# Patient Record
Sex: Male | Born: 2000 | Race: White | Hispanic: No | Marital: Single | Smoking: Current every day smoker
Health system: Southern US, Community
[De-identification: ages and names within clinical notes are randomized; demographics above are authoritative.]

## PROBLEM LIST (undated history)

## (undated) DIAGNOSIS — F32A Depression, unspecified: Secondary | ICD-10-CM

## (undated) DIAGNOSIS — T7840XA Allergy, unspecified, initial encounter: Secondary | ICD-10-CM

## (undated) DIAGNOSIS — F419 Anxiety disorder, unspecified: Secondary | ICD-10-CM

## (undated) DIAGNOSIS — L309 Dermatitis, unspecified: Secondary | ICD-10-CM

## (undated) DIAGNOSIS — K5900 Constipation, unspecified: Secondary | ICD-10-CM

## (undated) HISTORY — DX: Allergy, unspecified, initial encounter: T78.40XA

## (undated) HISTORY — DX: Anxiety disorder, unspecified: F41.9

## (undated) HISTORY — DX: Depression, unspecified: F32.A

---
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
## 2020-09-16 ENCOUNTER — Encounter: Payer: Self-pay | Admitting: Physician Assistant

## 2020-09-16 ENCOUNTER — Ambulatory Visit (INDEPENDENT_AMBULATORY_CARE_PROVIDER_SITE_OTHER): Payer: BC Managed Care – PPO | Admitting: Physician Assistant

## 2020-09-16 ENCOUNTER — Other Ambulatory Visit: Payer: Self-pay

## 2020-09-16 ENCOUNTER — Other Ambulatory Visit: Payer: Self-pay | Admitting: Physician Assistant

## 2020-09-16 VITALS — BP 110/60 | HR 89 | Temp 97.1°F | Resp 16 | Ht 75.0 in | Wt 198.0 lb

## 2020-09-16 DIAGNOSIS — H9311 Tinnitus, right ear: Secondary | ICD-10-CM

## 2020-09-16 DIAGNOSIS — H9041 Sensorineural hearing loss, unilateral, right ear, with unrestricted hearing on the contralateral side: Secondary | ICD-10-CM

## 2020-09-16 DIAGNOSIS — G4701 Insomnia due to medical condition: Secondary | ICD-10-CM

## 2020-09-16 DIAGNOSIS — H6982 Other specified disorders of Eustachian tube, left ear: Secondary | ICD-10-CM

## 2020-09-16 DIAGNOSIS — F32A Depression, unspecified: Secondary | ICD-10-CM

## 2020-09-16 DIAGNOSIS — L2082 Flexural eczema: Secondary | ICD-10-CM | POA: Diagnosis not present

## 2020-09-16 DIAGNOSIS — K5010 Crohn's disease of large intestine without complications: Secondary | ICD-10-CM

## 2020-09-16 DIAGNOSIS — Z23 Encounter for immunization: Secondary | ICD-10-CM

## 2020-09-16 DIAGNOSIS — F419 Anxiety disorder, unspecified: Secondary | ICD-10-CM

## 2020-09-16 MED ORDER — AMITRIPTYLINE HCL 50 MG PO TABS: 50.0000 mg | ORAL_TABLET | Freq: Every day | ORAL | 1 refills | Status: DC

## 2020-09-16 MED ORDER — TRIAMCINOLONE ACETONIDE 55 MCG/ACT NA AERO: 2.0000 | INHALATION_SPRAY | Freq: Every day | NASAL | 12 refills | Status: DC

## 2020-09-16 MED ORDER — TRIAMCINOLONE ACETONIDE 0.1 % EX CREA: 1.0000 "application " | TOPICAL_CREAM | Freq: Two times a day (BID) | CUTANEOUS | 0 refills | Status: DC

## 2020-09-16 NOTE — Patient Instructions (Addendum)
Please increase your Amitriptyline to 50 mg nightly..  I have sent in a new prescription for you.   For the skin, please continue your moisturizing regimen. Start the Triamcinolone twice daily as directed to the areas of concern.  We will do this for the next 10-14 days to calm things down with a plan to switch over to the [place] after this. (I am having to complete a prior authorization for this medication before we can send in).   For the ear, restart Zyrtec daily for 2 weeks. Also use the Nasacort daily as directed.  Let me know if the clogged sensation is not improving.   We will follow-up in 3-4 weeks. Sooner if needed.  It was very nice meeting you today. Welcome to [facility]!   Marland Kitchen

## 2020-09-16 NOTE — Progress Notes (Signed)
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  Patient endorses longstanding issue of flexural eczema, starting in his youth.  Notes some years are better than others.  Symptoms are always worse in the colder, drier months.  Notes significant itching of hands, wrists and the inside of his elbows.  Has been applying OTC moisturizing cream with only slight benefit.  Denies areas on his neck, face or lower extremities at present.  Patient also noting about a week or so of fullness of his left ear with pressure and popping.  Notes he had a cold a couple of weeks ago that has resolved.  Denies any tinnitus or draining from this ear.  Has chronic right ear tinnitus as noted previously.  Does have history of seasonal/environmental allergies.  Patient also endorses increased anxiety and depressed mood with anhedonia, mainly stemming from his frustration over his tendinitis and its effect on his sleep.  Was recently placed on amitriptyline 25 mg nightly by his ENT to help with sleep.  Notes tolerating medication well and some improvement in sleep.  Still dealing with depressed mood.  Start seeing counselor through school.  Notes good support system.  Denies suicidal thought or ideation.   Health Maintenance: Immunizations -- up-to-date.   Past Medical History:  Diagnosis Date  . Constipation since about age 64  . Eczema     Past Surgical History:  Procedure Laterality Date  . COLONOSCOPY N/A 12/29/2012   Procedure: COLONOSCOPY;  Surgeon: Oletha Blend, MD;  Location: [facility];  Service: Gastroenterology;  Laterality: N/A;  . COLONOSCOPY N/A 12/29/2012   Procedure: COLONOSCOPY;  Surgeon: Oletha Blend, MD;  Location: [facility];  Service: Gastroenterology;  Laterality:  N/A;  . ESOPHAGOGASTRODUODENOSCOPY N/A 12/29/2012   Procedure: ESOPHAGOGASTRODUODENOSCOPY (EGD);  Surgeon: Oletha Blend, MD;  Location: [facility];  Service: Gastroenterology;  Laterality: N/A;  . ESOPHAGOGASTRODUODENOSCOPY Bilateral 12/29/2012   Procedure: ESOPHAGOGASTRODUODENOSCOPY (EGD);  Surgeon: Oletha Blend, MD;  Location: [facility];  Service: Gastroenterology;  Laterality: Bilateral;    Current Outpatient Medications on File Prior to Visit  Medication Sig Dispense Refill  . adalimumab (HUMIRA) 40 MG/0.8ML injection Inject 0.8 mLs (40 mg total) into the skin every 14 (fourteen) days. Will need loading dose of 2 kits at onset followed by 1 kit in 2 weeks. 2 each 11  . loratadine (CLARITIN) 10 MG tablet Take 10 mg by mouth daily.    . Multiple Vitamin (MULTIVITAMIN) tablet Take 1 tablet by mouth daily.     No current facility-administered medications on file prior to visit.    No Known Allergies  History reviewed. No pertinent family history.  Social History   Socioeconomic History  . Marital status: Single    Spouse name: Not on file  . Number of children: Not on file  . Years of education: Not on file  . Highest education level: Not on file  Occupational History  . Occupation: Ship broker  Tobacco Use  . Smoking status: Never Smoker  . Smokeless tobacco: Never Used  Vaping Use  . Vaping Use: Never used  Substance and Sexual Activity  . Alcohol use: No  . Drug use: No  . Sexual activity: Never  Other Topics Concern  . Not on file  Social History Narrative  Student at [employer]   Social Determinants of Health   Financial Resource Strain:   . Difficulty of Paying Living Expenses: Not on file  Food Insecurity:   . Worried About Running Out of Food in the Last Year: Not on file  . Ran Out of Food in the Last Year: Not on file  Transportation Needs:   . Lack of Transportation (Medical): Not on file  . Lack of Transportation (Non-Medical): Not on file  Physical Activity:     . Days of Exercise per Week: Not on file  . Minutes of Exercise per Session: Not on file  Stress:   . Feeling of Stress : Not on file  Social Connections:   . Frequency of Communication with Friends and Family: Not on file  . Frequency of Social Gatherings with Friends and Family: Not on file  . Attends Religious Services: Not on file  . Active Member of Clubs or Organizations: Not on file  . Attends Club or Organization Meetings: Not on file  . Marital Status: Not on file  Intimate Partner Violence:   . Fear of Current or Ex-Partner: Not on file  . Emotionally Abused: Not on file  . Physically Abused: Not on file  . Sexually Abused: Not on file   ROS Pertinent ROS are listed in the HPI.   Resp 16   Ht 6' 3" (1.905 m)   Wt 198 lb (89.8 kg)   BMI 24.75 kg/m   Physical Exam Vitals reviewed.  Constitutional:      Appearance: Normal appearance.  HENT:     Head: Normocephalic and atraumatic.     Right Ear: Tympanic membrane, ear canal and external ear normal. There is no impacted cerumen.     Left Ear: Ear canal and external ear normal. There is no impacted cerumen.     Ears:     Comments: Mild serous effusion noted behind L TM.  Eyes:     Conjunctiva/sclera: Conjunctivae normal.     Pupils: Pupils are equal, round, and reactive to light.  Cardiovascular:     Rate and Rhythm: Normal rate and regular rhythm.     Pulses: Normal pulses.     Heart sounds: Normal heart sounds.  Pulmonary:     Effort: Pulmonary effort is normal.  Musculoskeletal:     Cervical back: Neck supple.  Skin:      Neurological:     General: No focal deficit present.     Mental Status: He is alert and oriented to person, place, and time.  Psychiatric:        Mood and Affect: Mood normal.    Assessment/Plan: 1. Flexural eczema Moderate disease state.  We will start with Kenalog 0.1% cream applied twice daily to areas of concern x2 weeks.  We will then plan to transition to Eucrisa.  Prior  authorization started for this medication.  Supportive measures and home moisturizing regimen reviewed with patient. - triamcinolone (KENALOG) 0.1 %; Apply 1 application topically 2 (two) times daily.  Dispense: 30 g; Refill: 0  2. Eustachian tube dysfunction, left We will start patient on Nasacort.  He is to resume use of Zyrtec daily.  Follow-up if not improving. - triamcinolone (NASACORT) 55 MCG/ACT AERO nasal inhaler; Place 2 sprays into the nose daily.  Dispense: 1 each; Refill: 12  3. Sensorineural hearing loss (SNHL) of right ear with unrestricted hearing of left ear 4. Tinnitus aurium, right Managed by ENT.  Is contributing to a lot of his   anxiety and depressive symptoms which we are starting treatment for as outlined below.  5. 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  Will increase Elavil to 50 mg nightly to further help with mood and sleep.  Close follow-up scheduled. - amitriptyline (ELAVIL) 50 MG tablet; Take 1 tablet (50 mg total) by mouth at bedtime.  Dispense: 30 tablet; Refill: 1  7. Crohn's disease of colon without complication ([place]) Under good control with current regimen.  Continue management per specialist.  8. Need for immunization against influenza Flu shot given at today's visit - Flu Vaccine QUAD 36+ mos IM  This visit occurred during the SARS-CoV-2 public health emergency.  Safety protocols were in place, including screening questions prior to the visit, additional usage of staff PPE, and extensive cleaning of exam room while observing appropriate contact time as indicated for disinfecting solutions.     Leeanne Rio, PA-C

## 2020-09-19 ENCOUNTER — Other Ambulatory Visit: Payer: Self-pay | Admitting: Physician Assistant

## 2020-10-14 ENCOUNTER — Telehealth: Payer: BC Managed Care – PPO | Admitting: Physician Assistant

## 2020-10-15 ENCOUNTER — Other Ambulatory Visit: Payer: Self-pay

## 2020-10-15 ENCOUNTER — Encounter: Payer: Self-pay | Admitting: Physician Assistant

## 2020-10-15 ENCOUNTER — Telehealth (INDEPENDENT_AMBULATORY_CARE_PROVIDER_SITE_OTHER): Payer: BC Managed Care – PPO | Admitting: Physician Assistant

## 2020-10-15 DIAGNOSIS — L2082 Flexural eczema: Secondary | ICD-10-CM

## 2020-10-15 DIAGNOSIS — H6982 Other specified disorders of Eustachian tube, left ear: Secondary | ICD-10-CM

## 2020-10-15 NOTE — Progress Notes (Signed)
Virtual Visit via Video   I connected with patient on 10/15/20 at  2:30 PM EST by a video enabled telemedicine application and verified that I am speaking with the correct person using two identifiers.  Location patient: Home Location provider: [facility], Office Persons participating in the virtual visit: Patient, Provider, CMA (Patina Moore)  I discussed the limitations of evaluation and management by telemedicine and the availability of in person appointments. The patient expressed understanding and agreed to proceed.  Subjective:   HPI:   Patient presents via [facility] today to follow-up regarding allergic rhinitis and eczema. At last visit, patient was restarted on Triamcinolone for flexural eczema. Endorses using as directed along with recommended OTC skin products, noting a substantial improvement in symptoms. Denies any new or worsening areas of concern.   In regards to chronic rhinitis and decreased hearing, patient was instructed to start nasal steroid and daily antihistamine. Patient endorses using as directed with improvement in nasal congestion, ear pressure and PND. Notes hearing feels back to normal.  ROS:   See pertinent positives and negatives per HPI.  Patient Active Problem List   Diagnosis Date Noted  . Family history of aortic dissection 10/12/2018  . History of colon resection 04/07/2018  . Eczema 04/07/2018  . 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 Sensorineural hearing loss (SNHL) of right ear with unrestricted hearing of left ear 09/03/2015  . Long-term use of adalimumab 04/25/2014  . Crohn's colitis (HCC) 12/31/2012  . 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    Social History   Tobacco Use  . Smoking status: Never Smoker  . Smokeless tobacco: Never Used  Substance Use Topics  . Alcohol use: Yes    Alcohol/week: 8.0 standard drinks    Types: 8 Cans of beer per week    Current Outpatient Medications:  .  amitriptyline (ELAVIL) 50 MG  tablet, Take 1 tablet (50 mg total) by mouth at bedtime., Disp: 30 tablet, Rfl: 1 .  cetirizine (ZYRTEC) 10 MG tablet, Take by mouth., Disp: , Rfl:  .  folic acid (FOLVITE) 800 MCG tablet, Take by mouth., Disp: , Rfl:  .  HUMIRA 40 MG/0.4ML PSKT, Inject into the skin., Disp: , Rfl:  .  lidocaine-prilocaine (EMLA) cream, Apply topically., Disp: , Rfl:  .  Melatonin 5 MG LOZG, Place 1 lozenge under the tongue at bedtime as needed., Disp: , Rfl:  .  methotrexate (RHEUMATREX) 2.5 MG tablet, Take by mouth., Disp: , Rfl:  .  ondansetron (ZOFRAN) 4 MG tablet, Take 1 tablet (4 mg total) by mouth 30 minutes before methotrexate dose every week., Disp: , Rfl:  .  triamcinolone (KENALOG) 0.1 %, Apply 1 application topically 2 (two) times daily., Disp: 30 g, Rfl: 0 .  triamcinolone (NASACORT) 55 MCG/ACT AERO nasal inhaler, Place 2 sprays into the nose daily., Disp: 1 each, Rfl: 12  No Known Allergies  Objective:   There were no vitals taken for this visit.  Patient is well-developed, well-nourished in no acute distress.  Resting comfortably at home.  Head is normocephalic, atraumatic.  No labored breathing.  Speech is clear and coherent with logical content.  Patient is alert and oriented at baseline.   Assessment and Plan:   1. Flexural eczema Improving. Continue current regimen, reducing topical steroid to once daily dosing for 1 week, then PRN. Continue moisturizing regimen.   2. Eustachian tube dysfunction, left Improved with combination antihistamine and nasal steroid. Hearing back to baseline. Continue current regimen.     Sherley Bounds  Daphine Deutscher, PA-C 10/15/2020

## 2020-10-15 NOTE — Progress Notes (Signed)
I have discussed the procedure for the virtual visit with the patient who has given consent to proceed with assessment and treatment.   Sujey Gundry S Haseeb Fiallos, CMA     

## 2020-10-31 ENCOUNTER — Other Ambulatory Visit: Payer: BC Managed Care – PPO

## 2021-03-12 ENCOUNTER — Telehealth: Payer: Self-pay | Admitting: Physician Assistant

## 2021-03-12 NOTE — Telephone Encounter (Signed)
Left a vm message with recommendations.

## 2021-03-12 NOTE — Telephone Encounter (Signed)
Since his provider is no longer here and it has been 6 months since that discussion, he will need to schedule an appt (preferably w/ his new provider)

## 2021-03-12 NOTE — Telephone Encounter (Signed)
Pt's mom called in asking if we can send in the script of the Eucrisa to the [facility] on [address].  Please see office note from Nov. With [place].   Please advise

## 2021-10-29 ENCOUNTER — Other Ambulatory Visit (HOSPITAL_BASED_OUTPATIENT_CLINIC_OR_DEPARTMENT_OTHER): Payer: Self-pay

## 2021-10-29 DIAGNOSIS — G4733 Obstructive sleep apnea (adult) (pediatric): Secondary | ICD-10-CM

## 2023-11-22 ENCOUNTER — Encounter: Payer: Self-pay | Admitting: Gastroenterology

## 2023-11-22 ENCOUNTER — Encounter (HOSPITAL_COMMUNITY): Payer: Self-pay | Admitting: Gastroenterology

## 2023-11-23 ENCOUNTER — Other Ambulatory Visit (HOSPITAL_COMMUNITY): Payer: Self-pay | Admitting: Gastroenterology

## 2023-11-23 DIAGNOSIS — K50811 Crohn's disease of both small and large intestine with rectal bleeding: Secondary | ICD-10-CM

## 2023-11-23 DIAGNOSIS — R109 Unspecified abdominal pain: Secondary | ICD-10-CM

## 2023-11-23 DIAGNOSIS — R634 Abnormal weight loss: Secondary | ICD-10-CM

## 2023-12-27 ENCOUNTER — Inpatient Hospital Stay (HOSPITAL_COMMUNITY)
Admission: EM | Admit: 2023-12-27 | Discharge: 2024-01-03 | DRG: 386 | Disposition: A | Discharge status: Home / Self Care | Payer: Self-pay | Attending: Student in an Organized Health Care Education/Training Program | Admitting: Student in an Organized Health Care Education/Training Program

## 2023-12-27 ENCOUNTER — Encounter (HOSPITAL_COMMUNITY): Payer: Self-pay | Admitting: Emergency Medicine

## 2023-12-27 ENCOUNTER — Other Ambulatory Visit: Payer: Self-pay

## 2023-12-27 DIAGNOSIS — R194 Change in bowel habit: Secondary | ICD-10-CM

## 2023-12-27 DIAGNOSIS — K50112 Crohn's disease of large intestine with intestinal obstruction: Secondary | ICD-10-CM | POA: Diagnosis not present

## 2023-12-27 DIAGNOSIS — Z7960 Long term (current) use of unspecified immunomodulators and immunosuppressants: Secondary | ICD-10-CM

## 2023-12-27 DIAGNOSIS — Z818 Family history of other mental and behavioral disorders: Secondary | ICD-10-CM

## 2023-12-27 DIAGNOSIS — Z79899 Other long term (current) drug therapy: Secondary | ICD-10-CM

## 2023-12-27 DIAGNOSIS — R1084 Generalized abdominal pain: Secondary | ICD-10-CM

## 2023-12-27 DIAGNOSIS — Z9049 Acquired absence of other specified parts of digestive tract: Secondary | ICD-10-CM

## 2023-12-27 DIAGNOSIS — K56609 Unspecified intestinal obstruction, unspecified as to partial versus complete obstruction: Secondary | ICD-10-CM | POA: Diagnosis not present

## 2023-12-27 DIAGNOSIS — D84821 Immunodeficiency due to drugs: Secondary | ICD-10-CM | POA: Diagnosis present

## 2023-12-27 DIAGNOSIS — K529 Noninfective gastroenteritis and colitis, unspecified: Secondary | ICD-10-CM

## 2023-12-27 DIAGNOSIS — F845 Asperger's syndrome: Secondary | ICD-10-CM | POA: Diagnosis present

## 2023-12-27 DIAGNOSIS — Z8279 Family history of other congenital malformations, deformations and chromosomal abnormalities: Secondary | ICD-10-CM

## 2023-12-27 DIAGNOSIS — Z8719 Personal history of other diseases of the digestive system: Secondary | ICD-10-CM

## 2023-12-27 MED ORDER — ONDANSETRON HCL 4 MG/2ML IJ SOLN
4.0000 mg | Freq: Once | INTRAMUSCULAR | Status: AC | PRN
  Administered 2023-12-28: 4 mg via INTRAVENOUS
  Filled 2023-12-27: qty 2

## 2023-12-27 NOTE — ED Triage Notes (Signed)
 Patient c/o abdominal pain tonight. Patient report taking PRN dicyclamine and tylenol for pain without relief. Patient report nausea and vomitting x 5 tonight. Patient denies fever. Hx of crohn's Dse

## 2023-12-28 ENCOUNTER — Emergency Department (HOSPITAL_COMMUNITY)

## 2023-12-28 ENCOUNTER — Encounter (HOSPITAL_COMMUNITY): Payer: Self-pay

## 2023-12-28 DIAGNOSIS — Z818 Family history of other mental and behavioral disorders: Secondary | ICD-10-CM | POA: Diagnosis not present

## 2023-12-28 DIAGNOSIS — K50918 Crohn's disease, unspecified, with other complication: Secondary | ICD-10-CM | POA: Diagnosis not present

## 2023-12-28 DIAGNOSIS — Z8719 Personal history of other diseases of the digestive system: Secondary | ICD-10-CM | POA: Diagnosis not present

## 2023-12-28 DIAGNOSIS — K56609 Unspecified intestinal obstruction, unspecified as to partial versus complete obstruction: Principal | ICD-10-CM | POA: Diagnosis present

## 2023-12-28 DIAGNOSIS — Z7960 Long term (current) use of unspecified immunomodulators and immunosuppressants: Secondary | ICD-10-CM | POA: Diagnosis not present

## 2023-12-28 DIAGNOSIS — Z8279 Family history of other congenital malformations, deformations and chromosomal abnormalities: Secondary | ICD-10-CM | POA: Diagnosis not present

## 2023-12-28 DIAGNOSIS — F845 Asperger's syndrome: Secondary | ICD-10-CM | POA: Diagnosis present

## 2023-12-28 DIAGNOSIS — Z9049 Acquired absence of other specified parts of digestive tract: Secondary | ICD-10-CM | POA: Diagnosis not present

## 2023-12-28 DIAGNOSIS — Z79899 Other long term (current) drug therapy: Secondary | ICD-10-CM | POA: Diagnosis not present

## 2023-12-28 DIAGNOSIS — D84821 Immunodeficiency due to drugs: Secondary | ICD-10-CM | POA: Diagnosis present

## 2023-12-28 DIAGNOSIS — K529 Noninfective gastroenteritis and colitis, unspecified: Secondary | ICD-10-CM | POA: Diagnosis not present

## 2023-12-28 DIAGNOSIS — K50112 Crohn's disease of large intestine with intestinal obstruction: Secondary | ICD-10-CM | POA: Diagnosis present

## 2023-12-28 DIAGNOSIS — R1084 Generalized abdominal pain: Secondary | ICD-10-CM | POA: Diagnosis not present

## 2023-12-28 LAB — CBC
HCT: 39.4 % (ref 39.0–52.0)
HCT: 40.6 % (ref 39.0–52.0)
Hemoglobin: 12.3 g/dL — ABNORMAL LOW (ref 13.0–17.0)
Hemoglobin: 13.1 g/dL (ref 13.0–17.0)
MCH: 26.5 pg (ref 26.0–34.0)
MCH: 27.1 pg (ref 26.0–34.0)
MCHC: 31.2 g/dL (ref 30.0–36.0)
MCHC: 32.3 g/dL (ref 30.0–36.0)
MCV: 84.1 fL (ref 80.0–100.0)
MCV: 84.9 fL (ref 80.0–100.0)
Platelets: 244 10*3/uL (ref 150–400)
Platelets: 269 10*3/uL (ref 150–400)
RBC: 4.64 MIL/uL (ref 4.22–5.81)
RBC: 4.83 MIL/uL (ref 4.22–5.81)
RDW: 13.8 % (ref 11.5–15.5)
RDW: 13.9 % (ref 11.5–15.5)
WBC: 11.2 10*3/uL — ABNORMAL HIGH (ref 4.0–10.5)
WBC: 7.7 10*3/uL (ref 4.0–10.5)
nRBC: 0 % (ref 0.0–0.2)
nRBC: 0 % (ref 0.0–0.2)

## 2023-12-28 LAB — URINALYSIS, ROUTINE W REFLEX MICROSCOPIC
Bilirubin Urine: NEGATIVE
Glucose, UA: NEGATIVE mg/dL
Hgb urine dipstick: NEGATIVE
Ketones, ur: 80 mg/dL — AB
Leukocytes,Ua: NEGATIVE
Nitrite: NEGATIVE
Protein, ur: NEGATIVE mg/dL
Specific Gravity, Urine: >1.046 — ABNORMAL HIGH (ref 1.005–1.030)
pH: 6 (ref 5.0–8.0)

## 2023-12-28 LAB — COMPREHENSIVE METABOLIC PANEL
ALT: 9 U/L (ref 0–44)
AST: 14 U/L — ABNORMAL LOW (ref 15–41)
Albumin: 3.9 g/dL (ref 3.5–5.0)
Alkaline Phosphatase: 57 U/L (ref 38–126)
Anion gap: 11 (ref 5–15)
BUN: 12 mg/dL (ref 6–20)
CO2: 25 mmol/L (ref 22–32)
Calcium: 8.9 mg/dL (ref 8.9–10.3)
Chloride: 101 mmol/L (ref 98–111)
Creatinine, Ser: 0.84 mg/dL (ref 0.61–1.24)
GFR, Estimated: >60 mL/min (ref >60)
Glucose, Bld: 99 mg/dL (ref 70–99)
Potassium: 3.7 mmol/L (ref 3.5–5.1)
Sodium: 137 mmol/L (ref 135–145)
Total Bilirubin: 0.5 mg/dL (ref 0.0–1.2)
Total Protein: 7.3 g/dL (ref 6.5–8.1)

## 2023-12-28 LAB — HIV ANTIBODY (ROUTINE TESTING W REFLEX): HIV Screen 4th Generation wRfx: NONREACTIVE

## 2023-12-28 LAB — CREATININE, SERUM
Creatinine, Ser: 0.81 mg/dL (ref 0.61–1.24)
GFR, Estimated: >60 mL/min (ref >60)

## 2023-12-28 LAB — LIPASE, BLOOD: Lipase: 23 U/L (ref 11–51)

## 2023-12-28 MED ORDER — HYDRALAZINE HCL 20 MG/ML IJ SOLN: 10.0000 mg | INTRAMUSCULAR | Status: DC | PRN

## 2023-12-28 MED ORDER — ONDANSETRON HCL 4 MG PO TABS
4.0000 mg | ORAL_TABLET | Freq: Four times a day (QID) | ORAL | Status: DC | PRN
Start: 2023-12-28 — End: 2024-01-03

## 2023-12-28 MED ORDER — IPRATROPIUM-ALBUTEROL 0.5-2.5 (3) MG/3ML IN SOLN: 3.0000 mL | RESPIRATORY_TRACT | Status: DC | PRN

## 2023-12-28 MED ORDER — DEXTROSE-SODIUM CHLORIDE 5-0.45 % IV SOLN: INTRAVENOUS | Status: DC

## 2023-12-28 MED ORDER — METHYLPREDNISOLONE SODIUM SUCC 40 MG IJ SOLR
40.0000 mg | Freq: Two times a day (BID) | INTRAMUSCULAR | Status: DC
  Administered 2023-12-28 – 2024-01-03 (×12): 40 mg via INTRAVENOUS
  Filled 2023-12-28 (×12): qty 1

## 2023-12-28 MED ORDER — SODIUM CHLORIDE 0.9 % IV SOLN
12.5000 mg | Freq: Once | INTRAVENOUS | Status: AC
  Administered 2023-12-28: 12.5 mg via INTRAVENOUS
  Filled 2023-12-28: qty 12.5

## 2023-12-28 MED ORDER — ACETAMINOPHEN 325 MG PO TABS
650.0000 mg | ORAL_TABLET | Freq: Four times a day (QID) | ORAL | Status: DC | PRN
  Administered 2024-01-02: 650 mg
  Filled 2023-12-28: qty 2

## 2023-12-28 MED ORDER — SODIUM CHLORIDE (PF) 0.9 % IJ SOLN
INTRAMUSCULAR | Status: AC
  Filled 2023-12-28: qty 50

## 2023-12-28 MED ORDER — HYDROMORPHONE HCL 1 MG/ML IJ SOLN
1.0000 mg | Freq: Once | INTRAMUSCULAR | Status: AC
  Administered 2023-12-28: 1 mg via INTRAVENOUS
  Filled 2023-12-28: qty 1

## 2023-12-28 MED ORDER — ONDANSETRON HCL 4 MG/2ML IJ SOLN
4.0000 mg | Freq: Four times a day (QID) | INTRAMUSCULAR | Status: DC | PRN
  Administered 2023-12-28: 4 mg via INTRAVENOUS
  Filled 2023-12-28: qty 2

## 2023-12-28 MED ORDER — SENNOSIDES-DOCUSATE SODIUM 8.6-50 MG PO TABS: 1.0000 | ORAL_TABLET | Freq: Every evening | ORAL | Status: DC | PRN

## 2023-12-28 MED ORDER — HYDROMORPHONE HCL 1 MG/ML IJ SOLN
1.0000 mg | INTRAMUSCULAR | Status: DC | PRN
  Administered 2023-12-28 – 2024-01-03 (×44): 1 mg via INTRAVENOUS
  Filled 2023-12-28 (×44): qty 1

## 2023-12-28 MED ORDER — SODIUM CHLORIDE 0.9 % IV SOLN: INTRAVENOUS | Status: DC

## 2023-12-28 MED ORDER — IOHEXOL 350 MG/ML SOLN
100.0000 mL | Freq: Once | INTRAVENOUS | Status: AC | PRN
  Administered 2023-12-28: 100 mL via INTRAVENOUS

## 2023-12-28 MED ORDER — ACETAMINOPHEN 650 MG RE SUPP: 650.0000 mg | Freq: Four times a day (QID) | RECTAL | Status: DC | PRN

## 2023-12-28 MED ORDER — KETOROLAC TROMETHAMINE 15 MG/ML IJ SOLN
15.0000 mg | Freq: Once | INTRAMUSCULAR | Status: AC
  Administered 2023-12-28: 15 mg via INTRAVENOUS
  Filled 2023-12-28: qty 1

## 2023-12-28 MED ORDER — METOPROLOL TARTRATE 5 MG/5ML IV SOLN: 5.0000 mg | INTRAVENOUS | Status: DC | PRN

## 2023-12-28 MED ORDER — SIMETHICONE 80 MG PO CHEW
80.0000 mg | CHEWABLE_TABLET | Freq: Once | ORAL | Status: AC
  Administered 2023-12-28: 80 mg via ORAL
  Filled 2023-12-28: qty 1

## 2023-12-28 MED ORDER — MORPHINE SULFATE (PF) 4 MG/ML IV SOLN
4.0000 mg | Freq: Once | INTRAVENOUS | Status: AC
  Administered 2023-12-28: 4 mg via INTRAVENOUS
  Filled 2023-12-28: qty 1

## 2023-12-28 MED ORDER — BISACODYL 5 MG PO TBEC: 5.0000 mg | DELAYED_RELEASE_TABLET | Freq: Every day | ORAL | Status: DC | PRN

## 2023-12-28 MED ORDER — NICOTINE 21 MG/24HR TD PT24: 21.0000 mg | MEDICATED_PATCH | Freq: Every day | TRANSDERMAL | Status: DC | PRN

## 2023-12-28 MED ORDER — FLEET ENEMA RE ENEM: 1.0000 | ENEMA | Freq: Once | RECTAL | Status: DC | PRN

## 2023-12-28 MED ORDER — HEPARIN SODIUM (PORCINE) 5000 UNIT/ML IJ SOLN
5000.0000 [IU] | Freq: Three times a day (TID) | INTRAMUSCULAR | Status: DC
  Administered 2023-12-28 – 2024-01-03 (×19): 5000 [IU] via SUBCUTANEOUS
  Filled 2023-12-28 (×19): qty 1

## 2023-12-28 MED ORDER — OXYCODONE HCL 5 MG PO TABS
5.0000 mg | ORAL_TABLET | ORAL | Status: DC | PRN
  Administered 2023-12-30 – 2024-01-03 (×5): 5 mg
  Filled 2023-12-28 (×5): qty 1

## 2023-12-28 MED ORDER — SODIUM CHLORIDE 0.9 % IV BOLUS
1000.0000 mL | Freq: Once | INTRAVENOUS | Status: AC
  Administered 2023-12-28: 1000 mL via INTRAVENOUS

## 2023-12-28 MED ORDER — ONDANSETRON HCL 4 MG/2ML IJ SOLN
4.0000 mg | Freq: Once | INTRAMUSCULAR | Status: AC
  Administered 2023-12-28: 4 mg via INTRAVENOUS
  Filled 2023-12-28: qty 2

## 2023-12-28 NOTE — ED Provider Notes (Signed)
 [place] EMERGENCY DEPARTMENT AT [facility] Provider Note   CSN: [id number] Arrival date & time: 12/27/23  2331     History  Chief Complaint  Patient presents with   Abdominal Pain    Nosson Wender is a 23 y.o. male history of marfanoid hypermobility syndrome, Crohn's on immunosuppressants, Asperger presenting with abdominal pain that began this morning.  Patient had nausea vomiting but states he is still having bowel movements.  Patient denies fevers.  Patient's pain is all around his belly.  Patient is unsure if this is similar to previous Crohn's exacerbations.  Patient has been compliant with his medications.  Patient is currently on Skyrizi.  Home Medications Prior to Admission medications   Medication Sig Start Date End Date Taking? Authorizing Provider  amitriptyline (ELAVIL) 50 MG tablet Take 1 tablet (50 mg total) by mouth at bedtime. 09/16/20   Waldon Merl, PA-C  cetirizine (ZYRTEC) 10 MG tablet Take by mouth.    [provider]  folic acid (FOLVITE) 800 MCG tablet Take by mouth.    [provider]  HUMIRA 40 MG/0.4ML PSKT Inject into the skin. 09/26/20   [provider]  lidocaine-prilocaine (EMLA) cream Apply topically. 09/02/15   [provider]  Melatonin 5 MG LOZG Place 1 lozenge under the tongue at bedtime as needed.    [provider]  ondansetron (ZOFRAN) 4 MG tablet Take 1 tablet (4 mg total) by mouth 30 minutes before methotrexate dose every week. 09/10/20   [provider]  triamcinolone (KENALOG) 0.1 % Apply 1 application topically 2 (two) times daily. 09/16/20   Waldon Merl, PA-C  triamcinolone (NASACORT) 55 MCG/ACT AERO nasal inhaler Place 2 sprays into the nose daily. 09/16/20   Waldon Merl, PA-C      Allergies    Patient has no known allergies.    Review of Systems   Review of Systems  Gastrointestinal:  Positive for abdominal pain.    Physical Exam Updated Vital  Signs BP 122/64   Pulse 72   Temp 98.8 F (37.1 C) (Oral)   Resp 18   SpO2 98%  Physical Exam Constitutional:      General: He is in acute distress.     Comments: Writhing in pain on the ground  Cardiovascular:     Rate and Rhythm: Normal rate and regular rhythm.     Pulses: Normal pulses.     Heart sounds: Normal heart sounds.  Pulmonary:     Effort: Pulmonary effort is normal. No respiratory distress.     Breath sounds: Normal breath sounds.  Abdominal:     General: There is no distension.     Palpations: Abdomen is soft.     Tenderness: There is generalized abdominal tenderness. There is guarding. There is no rebound. Negative signs include Murphy's sign, Rovsing's sign, McBurney's sign and psoas sign.  Skin:    General: Skin is warm and dry.  Neurological:     Mental Status: He is alert and oriented to person, place, and time.  Psychiatric:        Mood and Affect: Mood normal.     ED Results / Procedures / Treatments   Labs (all labs ordered are listed, but only abnormal results are displayed) Labs Reviewed  COMPREHENSIVE METABOLIC PANEL - Abnormal; Notable for the following components:      Result Value   AST 14 (*)    All other components within normal limits  CBC - Abnormal; Notable  for the following components:   WBC 11.2 (*)    All other components within normal limits  LIPASE, BLOOD  URINALYSIS, ROUTINE W REFLEX MICROSCOPIC    EKG None  Radiology CT Angio Chest/Abd/Pel for Dissection W and/or W/WO Result Date: 12/28/2023 CLINICAL DATA:  Nausea and vomiting. History of bowel resection. Crohn's disease. Abdominal pain. EXAM: CT ANGIOGRAPHY CHEST, ABDOMEN AND PELVIS TECHNIQUE: Non-contrast CT of the chest was initially obtained. Multidetector CT imaging through the chest, abdomen and pelvis was performed using the standard protocol during bolus administration of intravenous contrast. Multiplanar reconstructed images and MIPs were obtained and reviewed to  evaluate the vascular anatomy. RADIATION DOSE REDUCTION: This exam was performed according to the departmental dose-optimization program which includes automated exposure control, adjustment of the mA and/or kV according to patient size and/or use of iterative reconstruction technique. CONTRAST:  OMNIPAQUE IOHEXOL 350 MG/ML SOLN COMPARISON:  Radiographs 12/29/2012 and 12/28/2012 FINDINGS: CTA CHEST FINDINGS Cardiovascular: Normal heart size. No pericardial effusion. No pulmonary embolism. No acute aortic syndrome. Mediastinum/Nodes: Trachea and esophagus are unremarkable. No thoracic adenopathy. Lungs/Pleura: The lungs are clear. No pleural effusion or pneumothorax. Musculoskeletal: No acute fracture. Review of the MIP images confirms the above findings. CTA ABDOMEN AND PELVIS FINDINGS VASCULAR Aorta: Normal caliber aorta without aneurysm, dissection, vasculitis or significant stenosis. Celiac: Patent without evidence of aneurysm, dissection, vasculitis or significant stenosis. SMA: Patent without evidence of aneurysm, dissection, vasculitis or significant stenosis. Renals: Both renal arteries are patent without evidence of aneurysm, dissection, vasculitis, fibromuscular dysplasia or significant stenosis. IMA: Patent without evidence of aneurysm, dissection, vasculitis or significant stenosis. Inflow: Patent without evidence of aneurysm, dissection, vasculitis or significant stenosis. Veins: The IVC and SMV are not opacified favored due to contrast timing. Grossly patent portal and splenic veins. Review of the MIP images confirms the above findings. NON-VASCULAR Hepatobiliary: No acute abnormality. Pancreas: No acute abnormality. Spleen: Unremarkable. Adrenals/Urinary Tract: Unremarkable adrenal glands and kidneys. No hydronephrosis. Unremarkable bladder. Stomach/Bowel: Postoperative change of partial colectomy with anastomosis in the left upper quadrant. The stomach is within normal limits. Multiple dilated  loops of small bowel in the pelvis measuring up to 4.6 cm in diameter. Between the dilated loops of small bowel there are multiple segments of small-bowel wall thickening with mucosal hyperenhancement. Findings are suspicious for partial small bowel obstruction due to inflamed strictures. The appendix is not clearly visualized. Lymphatic: Borderline enlarged mesenteric lymph nodes are likely reactive. Reproductive: Unremarkable. Other: Small volume free fluid in the pelvis. No free intraperitoneal air. Musculoskeletal: No acute fracture. Review of the MIP images confirms the above findings. IMPRESSION: 1. Multiple dilated loops of small bowel in the pelvis measuring up to 4.6 cm in diameter. Between the dilated segments of small bowel there are multiple segments of small-bowel wall thickening with mucosal hyperenhancement. Findings are suspicious for partial small bowel obstruction due to enteritis and inflamed strictures. 2. Small volume free fluid in the pelvis. No abscess or free intraperitoneal air. 3. No acute aortic syndrome. No pulmonary embolism. Electronically Signed   By: Minerva Fester M.D.   On: 12/28/2023 03:58    Procedures Procedures    Medications Ordered in ED Medications  ondansetron (ZOFRAN) injection 4 mg (4 mg Intravenous Given 12/28/23 0004)  sodium chloride 0.9 % bolus 1,000 mL (1,000 mLs Intravenous New Bag/Given 12/28/23 0309)  morphine (PF) 4 MG/ML injection 4 mg (4 mg Intravenous Given 12/28/23 0308)  ondansetron (ZOFRAN) injection 4 mg (4 mg Intravenous Given 12/28/23 0308)  iohexol (OMNIPAQUE)  350 MG/ML injection 100 mL (100 mLs Intravenous Contrast Given 12/28/23 0324)  HYDROmorphone (DILAUDID) injection 1 mg (1 mg Intravenous Given 12/28/23 0358)    ED Course/ Medical Decision Making/ A&P                                 Medical Decision Making Amount and/or Complexity of Data Reviewed Labs: ordered. Radiology: ordered.  Risk Prescription drug management. Decision  regarding hospitalization.   Yazid Pop 23 y.o. presented today for abdominal pain.  Working DDx that I considered at this time includes, but not limited to, gastroenteritis, colitis, small bowel obstruction, appendicitis, cholecystitis, hepatobiliary pathology, gastritis, PUD, ACS, aortic dissection, diverticulosis/diverticulitis, pancreatitis, nephrolithiasis, medication induced, AAA, UTI, pyelonephritis, testicular torsion.  R/o DDx: gastroenteritis, colitis, appendicitis, cholecystitis, hepatobiliary pathology, gastritis, PUD, ACS, aortic dissection, diverticulosis/diverticulitis, pancreatitis, nephrolithiasis, medication induced, AAA, UTI, pyelonephritis, testicular torsion: These are considered less likely due to history of present illness, physical exam, labs/imaging findings.  Review of prior external notes: 01/24/2022 admission  Unique Tests and My Independent Interpretation:  CBC with differential: Leukocytosis 11.2 CMP: Unremarkable Lipase: pending UA: pending CTA dissection: Partial obstruction noted with possible enteritis  Social Determinants of Health: none  Discussion with Independent Historian:  Mother  Discussion of Management of Tests:  Elnoria Howard, MD GI ; [place], MD Hospitalist  Risk: High: hospitalization or escalation of hospital-level care  Risk Stratification Score: None  Plan: On exam patient was acute distress with stable vitals.  Patient has generalized abdominal tenderness with guarding on exam.  Patient does have history of Marfan along with family history of dissection and given his presentation will obtain CTA imaging.  Patient does have history of Crohn's but states that this feels different.  Patient states he still passing gas.  On recheck patient was still in pain after the morphine so we will give Dilaudid and will give Zofran.  Imaging shows suspected partial SBO and so we will reach out to GI.  Given patient's complicated abdominal history along with  the fact his pain is not under control do feel patient will need admission.  I spoke to GI and they recommend NG tube and that they will see patient once admitted.  Will consult hospitalist for admission.  Patient states he is still passing gas.  I spoke to the hospitalist and patient was accepted for admission.  Patient stable to be admitted.  This chart was dictated using voice recognition software.  Despite best efforts to proofread,  errors can occur which can change the documentation meaning.         Final Clinical Impression(s) / ED Diagnoses Final diagnoses:  SBO (small bowel obstruction) [facility])    Rx / DC Orders ED Discharge Orders     None         Remi Deter 12/28/23 0516    Glynn Octave, MD 12/28/23 [phone]

## 2023-12-28 NOTE — ED Notes (Signed)
 NG tube flushed with 40ml saline, placed nose attachment for NG tube

## 2023-12-28 NOTE — Consult Note (Signed)
 Reason for Consult:Severe abdominal pain; falre up of Crohn's disease. Referring Physician: THP  Manuel [name] is an 23 y.o. male.  HPI: Patient is a 46 year white male who was diagnosed with Crohn's colitis/ileitis in 2016 when he had a partial colectomy when he had the distal transverse colon, descending colon and proximal sigmoid colon resected [with a primary anastomosis for fibrostenotic disease]. The pathology revealed severe chronic active colitis with acute cryptitis, crypt abscesses, mucosal ulceration with transmural inflammation consistent with a history of Crohn;s disease. He was on Humira and 6 MP at that time. He claims the Humira helped for a while and then his symptoms flared up again. He took the Humira for 5 years. He also took Rinvoq for a while before it stopped working for him. He saw me in the office for one office visit on 11/17/2023 to get established for his GI care.Currently he is in [place] that he has taken once a month for the last 7 months. His last dose was about 1 month ago.Last night he started to have severe nausea and vomiting with acute abdominal pain after dinner prompting him to come to the ER. He denied having any diarrhea or rectal bleeding. In the ER a CT scan of the chest, abdomen and pelvis revealed multiple dilated loops of small bowel with mucosal hyperenhancement in multiple loops of small bowel ?small bowel strictures; findings are suspicious for SBO vs enteritis; He has lost "a lot of weight" but he cannot quantify how much. There is a family history of aortic aneurysms in his paternal uncles in their 16's and his paternal grandfather in his 15's.??Marfan's syndrome.   PAST NEDICAL HISTORY Crohn's colitis/ileitis [place]    Past Surgical History:  Procedure Laterality Date   BOWEL RESECTION  07/2015   COLONOSCOPY N/A 12/29/2012   Procedure: COLONOSCOPY;  Surgeon: Jon Gills, MD;  Location: [facility] OR;  Service: Gastroenterology;  Laterality: N/A;    COLONOSCOPY N/A 12/29/2012   Procedure: COLONOSCOPY;  Surgeon: Jon Gills, MD;  Location: [facility] OR;  Service: Gastroenterology;  Laterality: N/A;   ESOPHAGOGASTRODUODENOSCOPY N/A 12/29/2012   Procedure: ESOPHAGOGASTRODUODENOSCOPY (EGD);  Surgeon: Jon Gills, MD;  Location: [facility] OR;  Service: Gastroenterology;  Laterality: N/A;   ESOPHAGOGASTRODUODENOSCOPY Bilateral 12/29/2012   Procedure: ESOPHAGOGASTRODUODENOSCOPY (EGD);  Surgeon: Jon Gills, MD;  Location: [facility] OR;  Service: Gastroenterology;  Laterality: Bilateral;   Family History  Problem Relation Age of Onset   Depression Father    Birth defects Maternal Grandmother    Alcohol abuse Maternal Grandfather    Birth defects Maternal Grandfather     Social History:  reports that he has never smoked. He has never used smokeless tobacco. He reports current alcohol use of about 8.0 standard drinks of alcohol per week. He reports current drug use. Drug: Marijuana.  Allergies: No Known Allergies  Medications: I have reviewed the patient's current medications. Prior to Admission:  Medications Prior to Admission  Medication Sig Dispense Refill Last Dose/Taking   dicyclomine (BENTYL) 20 MG tablet Take 1 tablet by mouth 4 (four) times daily.   12/27/2023   ondansetron (ZOFRAN-ODT) 4 MG disintegrating tablet Take 4 mg by mouth every 8 (eight) hours as needed for vomiting or nausea.   12/27/2023   SKYRIZI 360 MG/2.4ML SOCT Inject 360 mg into the skin See admin instructions. Every 8 weeks   Past Month   Scheduled:  heparin  5,000 Units Subcutaneous Q8H   methylPREDNISolone (SOLU-MEDROL) injection  40 mg Intravenous Q12H  Continuous:  dextrose 5 % and 0.45 % NaCl 75 mL/hr at 12/28/23 1018   ZOX:[id number] **OR** acetaminophen, bisacodyl, hydrALAZINE, HYDROmorphone (DILAUDID) injection, ipratropium-albuterol, metoprolol tartrate, nicotine, ondansetron **OR** ondansetron (ZOFRAN) IV, oxyCODONE, senna-docusate, sodium phosphate  Results for  orders placed or performed during the hospital encounter of 12/27/23 (from the past 48 hours)  Comprehensive metabolic panel     Status: Abnormal   Collection Time: 12/28/23 12:17 AM  Result Value Ref Range   Sodium 137 135 - 145 mmol/L   Potassium 3.7 3.5 - 5.1 mmol/L   Chloride 101 98 - 111 mmol/L   CO2 25 22 - 32 mmol/L   Glucose, Bld 99 70 - 99 mg/dL    Comment: Glucose reference range applies only to samples taken after fasting for at least 8 hours.   BUN 12 6 - 20 mg/dL   Creatinine, Ser 1.47 0.61 - 1.24 mg/dL   Calcium 8.9 8.9 - 82.9 mg/dL   Total Protein 7.3 6.5 - 8.1 g/dL   Albumin 3.9 3.5 - 5.0 g/dL   AST 14 (L) 15 - 41 U/L   ALT 9 0 - 44 U/L   Alkaline Phosphatase 57 38 - 126 U/L   Total Bilirubin 0.5 0.0 - 1.2 mg/dL   GFR, Estimated >56 >21 mL/min    Comment: (NOTE) Calculated using the CKD-EPI Creatinine Equation (2021)    Anion gap 11 5 - 15    Comment: Performed at [facility], [address], [place], [place] [address]  CBC     Status: Abnormal   Collection Time: 12/28/23 12:17 AM  Result Value Ref Range   WBC 11.2 (H) 4.0 - 10.5 K/uL   RBC 4.83 4.22 - 5.81 MIL/uL   Hemoglobin 13.1 13.0 - 17.0 g/dL   HCT 78.4 69.6 - 29.5 %   MCV 84.1 80.0 - 100.0 fL   MCH 27.1 26.0 - 34.0 pg   MCHC 32.3 30.0 - 36.0 g/dL   RDW 28.4 13.2 - 44.0 %   Platelets 269 150 - 400 K/uL   nRBC 0.0 0.0 - 0.2 %    Comment: Performed at [facility], [address] [place], [place] [address]  Urinalysis, Routine w reflex microscopic -Urine, Clean Catch     Status: Abnormal   Collection Time: 12/28/23  8:00 AM  Result Value Ref Range   Color, Urine YELLOW YELLOW   APPearance CLEAR CLEAR   Specific Gravity, Urine >1.046 (H) 1.005 - 1.030   pH 6.0 5.0 - 8.0   Glucose, UA NEGATIVE NEGATIVE mg/dL   Hgb urine dipstick NEGATIVE NEGATIVE   Bilirubin Urine NEGATIVE NEGATIVE   Ketones, ur 80 (A) NEGATIVE mg/dL   Protein, ur NEGATIVE NEGATIVE mg/dL    Nitrite NEGATIVE NEGATIVE   Leukocytes,Ua NEGATIVE NEGATIVE    Comment: Performed at [facility], [address], [place], [place] [address]  Lipase, blood     Status: None   Collection Time: 12/28/23  2:20 PM  Result Value Ref Range   Lipase 23 11 - 51 U/L    Comment: Performed at [facility], [address], [place], [place] [address]  HIV Antibody (routine testing w rflx)     Status: None   Collection Time: 12/28/23  2:20 PM  Result Value Ref Range   HIV Screen 4th Generation wRfx Non Reactive Non Reactive    Comment: Performed at [facility], [address] [place], [place] [address]  CBC  Status: Abnormal   Collection Time: 12/28/23  2:20 PM  Result Value Ref Range   WBC 7.7 4.0 - 10.5 K/uL   RBC 4.64 4.22 - 5.81 MIL/uL   Hemoglobin 12.3 (L) 13.0 - 17.0 g/dL   HCT 16.1 09.6 - 04.5 %   MCV 84.9 80.0 - 100.0 fL   MCH 26.5 26.0 - 34.0 pg   MCHC 31.2 30.0 - 36.0 g/dL   RDW 40.9 81.1 - 91.4 %   Platelets 244 150 - 400 K/uL   nRBC 0.0 0.0 - 0.2 %    Comment: Performed at [facility], [address], [place], [place] [address]  Creatinine, serum     Status: None   Collection Time: 12/28/23  2:20 PM  Result Value Ref Range   Creatinine, Ser 0.81 0.61 - 1.24 mg/dL   GFR, Estimated >62 >13 mL/min    Comment: (NOTE) Calculated using the CKD-EPI Creatinine Equation (2021) Performed at [facility], [address] [place], [place] [address]     DG Abdomen 1 View Result Date: 12/28/2023 CLINICAL DATA:  23 year old male enteric tube placement. EXAM: ABDOMEN - 1 VIEW COMPARISON:  CTA CT Chest, Abdomen, and Pelvis 0325 hours today. FINDINGS: AP view at 0609 hours. Enteric tube placed into the stomach, side hole the level of the gastric fundus. Contrast being excreted into nondilated renal collecting systems and ureters. Contrast partially visible in the urinary bladder. Stable bowel gas pattern.  IMPRESSION: Satisfactory enteric tube placement into the stomach. Electronically Signed   By: Odessa Fleming M.D.   On: 12/28/2023 07:09   CT Angio Chest/Abd/Pel for Dissection W and/or W/WO Result Date: 12/28/2023 CLINICAL DATA:  Nausea and vomiting. History of bowel resection. Crohn's disease. Abdominal pain. EXAM: CT ANGIOGRAPHY CHEST, ABDOMEN AND PELVIS TECHNIQUE: Non-contrast CT of the chest was initially obtained. Multidetector CT imaging through the chest, abdomen and pelvis was performed using the standard protocol during bolus administration of intravenous contrast. Multiplanar reconstructed images and MIPs were obtained and reviewed to evaluate the vascular anatomy. RADIATION DOSE REDUCTION: This exam was performed according to the departmental dose-optimization program which includes automated exposure control, adjustment of the mA and/or kV according to patient size and/or use of iterative reconstruction technique. CONTRAST:  OMNIPAQUE IOHEXOL 350 MG/ML SOLN COMPARISON:  Radiographs 12/29/2012 and 12/28/2012 FINDINGS: CTA CHEST FINDINGS Cardiovascular: Normal heart size. No pericardial effusion. No pulmonary embolism. No acute aortic syndrome. Mediastinum/Nodes: Trachea and esophagus are unremarkable. No thoracic adenopathy. Lungs/Pleura: The lungs are clear. No pleural effusion or pneumothorax. Musculoskeletal: No acute fracture. Review of the MIP images confirms the above findings. CTA ABDOMEN AND PELVIS FINDINGS VASCULAR Aorta: Normal caliber aorta without aneurysm, dissection, vasculitis or significant stenosis. Celiac: Patent without evidence of aneurysm, dissection, vasculitis or significant stenosis. SMA: Patent without evidence of aneurysm, dissection, vasculitis or significant stenosis. Renals: Both renal arteries are patent without evidence of aneurysm, dissection, vasculitis, fibromuscular dysplasia or significant stenosis. IMA: Patent without evidence of aneurysm, dissection, vasculitis or  significant stenosis. Inflow: Patent without evidence of aneurysm, dissection, vasculitis or significant stenosis. Veins: The IVC and SMV are not opacified favored due to contrast timing. Grossly patent portal and splenic veins. Review of the MIP images confirms the above findings. NON-VASCULAR Hepatobiliary: No acute abnormality. Pancreas: No acute abnormality. Spleen: Unremarkable. Adrenals/Urinary Tract: Unremarkable adrenal glands and kidneys. No hydronephrosis. Unremarkable bladder. Stomach/Bowel: Postoperative change of partial colectomy with anastomosis in the left upper quadrant. The stomach is within normal limits. Multiple  dilated loops of small bowel in the pelvis measuring up to 4.6 cm in diameter. Between the dilated loops of small bowel there are multiple segments of small-bowel wall thickening with mucosal hyperenhancement. Findings are suspicious for partial small bowel obstruction due to inflamed strictures. The appendix is not clearly visualized. Lymphatic: Borderline enlarged mesenteric lymph nodes are likely reactive. Reproductive: Unremarkable. Other: Small volume free fluid in the pelvis. No free intraperitoneal air. Musculoskeletal: No acute fracture. Review of the MIP images confirms the above findings. IMPRESSION: 1. Multiple dilated loops of small bowel in the pelvis measuring up to 4.6 cm in diameter. Between the dilated segments of small bowel there are multiple segments of small-bowel wall thickening with mucosal hyperenhancement. Findings are suspicious for partial small bowel obstruction due to enteritis and inflamed strictures. 2. Small volume free fluid in the pelvis. No abscess or free intraperitoneal air. 3. No acute aortic syndrome. No pulmonary embolism. Electronically Signed   By: Minerva Fester M.D.   On: 12/28/2023 03:58   Review of Systems  Constitutional:  Positive for activity change, appetite change, fatigue, fever and unexpected weight change. Negative for chills and  diaphoresis.  HENT: Negative.    Eyes: Negative.   Respiratory: Negative.    Cardiovascular: Negative.   Gastrointestinal:  Positive for abdominal pain, nausea and vomiting. Negative for abdominal distention, anal bleeding, blood in stool, constipation, diarrhea and rectal pain.  Endocrine: Negative.   Genitourinary: Negative.   Musculoskeletal:  Positive for arthralgias.  Allergic/Immunologic: Negative.   Neurological:  Positive for weakness and light-headedness.  Hematological: Negative.   Psychiatric/Behavioral:  Positive for agitation.    Blood pressure 107/62, pulse (!) 48, temperature 98 F (36.7 C), temperature source Oral, resp. rate 18, height 6\' 3"  (1.905 m), weight 67.8 kg, SpO2 98%. Physical Exam Constitutional:      General: He is in acute distress.     Appearance: He is ill-appearing and toxic-appearing.     Comments: NGT is in place  Eyes:     Extraocular Movements: Extraocular movements intact.     Pupils: Pupils are equal, round, and reactive to light.  Cardiovascular:     Rate and Rhythm: Rhythm irregular.  Pulmonary:     Effort: Pulmonary effort is normal.     Breath sounds: Normal breath sounds.  Abdominal:     General: Abdomen is flat. A surgical scar is present. Bowel sounds are decreased. There is no distension.     Palpations: There is no shifting dullness or hepatomegaly.     Tenderness: There is generalized abdominal tenderness.  Skin:    General: Skin is warm and dry.  Neurological:     General: No focal deficit present.     Mental Status: He is oriented to person, place, and time.  Psychiatric:        Mood and Affect: Mood is anxious.   Assessment/Plan: Crohn's colitis/ileitis/abnormal weight loss-multiple dilated loops of small bowel in the pelvis measuring 4.6 cms- between dilated loops of small bowel multiple segments of small bowel wall thickening with mucosal hyperenhancement suspicious for partial small bowel obstruction due to enteritis and  inflamed strictures; will start IV Solu-Medrol now hoping for some resolution of the acute small bowel inflammation. We will also try to increase the frequency of the Cristy Folks if his insurance company allows [place] to do so. 2) Asperger's syndrome.  ) Family history of AAA ?Marfan's syndrome. Charna Elizabeth 12/28/2023, 8:09 PM

## 2023-12-28 NOTE — Plan of Care (Signed)
   Problem: Education: Goal: Knowledge of General Education information will improve Description Including pain rating scale, medication(s)/side effects and non-pharmacologic comfort measures Outcome: Progressing   Problem: Health Behavior/Discharge Planning: Goal: Ability to manage health-related needs will improve Outcome: Progressing

## 2023-12-28 NOTE — Consult Note (Incomplete)
 Reason for Consult: Severe abdominal pain with nausea vomiting in the setting of Crohn's disease  Referring Physician: [facility]  Manuel [name] is an 23 y.o. male.  HPI: Manuel [name] is a 23 year old white male, who was initially diagnosed with Crohn's colitis at the age of 46 and had a Marine scientist at [facility].  Past Medical History:  Diagnosis Date  . Allergy   . Anxiety   . Constipation since about age 83  . Depression   . Eczema    Past Surgical History:  Procedure Laterality Date  . BOWEL RESECTION  07/2015  . COLONOSCOPY N/A 12/29/2012   Procedure: COLONOSCOPY;  Surgeon: Jon Gills, MD;  Location: [facility] OR;  Service: Gastroenterology;  Laterality: N/A;  . COLONOSCOPY N/A 12/29/2012   Procedure: COLONOSCOPY;  Surgeon: Jon Gills, MD;  Location: [facility] OR;  Service: Gastroenterology;  Laterality: N/A;  . ESOPHAGOGASTRODUODENOSCOPY N/A 12/29/2012   Procedure: ESOPHAGOGASTRODUODENOSCOPY (EGD);  Surgeon: Jon Gills, MD;  Location: [facility] OR;  Service: Gastroenterology;  Laterality: N/A;  . ESOPHAGOGASTRODUODENOSCOPY Bilateral 12/29/2012   Procedure: ESOPHAGOGASTRODUODENOSCOPY (EGD);  Surgeon: Jon Gills, MD;  Location: [facility] OR;  Service: Gastroenterology;  Laterality: Bilateral;   Family History  Problem Relation Age of Onset  . Depression Father   . Birth defects Maternal Grandmother   . Alcohol abuse Maternal Grandfather   . Birth defects Maternal Grandfather    Social History:  reports that he has never smoked. He has never used smokeless tobacco. He reports current alcohol use of about 8.0 standard drinks of alcohol per week. He reports current drug use. Drug: Marijuana.  Allergies: No Known Allergies  Medications: I have reviewed the patient's current medications. Prior to Admission:  Medications Prior to Admission  Medication Sig Dispense Refill Last Dose/Taking  . dicyclomine (BENTYL) 20 MG tablet Take 1 tablet by mouth 4 (four) times daily.   12/27/2023  .  ondansetron (ZOFRAN-ODT) 4 MG disintegrating tablet Take 4 mg by mouth every 8 (eight) hours as needed for vomiting or nausea.   12/27/2023  . SKYRIZI 360 MG/2.4ML SOCT Inject 360 mg into the skin See admin instructions. Every 8 weeks   Past Month   Scheduled: . heparin  5,000 Units Subcutaneous Q8H   Continuous: . dextrose 5 % and 0.45 % NaCl 75 mL/hr at 12/28/23 1018   ZOX:[id number] **OR** acetaminophen, bisacodyl, hydrALAZINE, HYDROmorphone (DILAUDID) injection, ipratropium-albuterol, metoprolol tartrate, nicotine, ondansetron **OR** ondansetron (ZOFRAN) IV, oxyCODONE, senna-docusate, sodium phosphate  Results for orders placed or performed during the hospital encounter of 12/27/23 (from the past 48 hours)  Comprehensive metabolic panel     Status: Abnormal   Collection Time: 12/28/23 12:17 AM  Result Value Ref Range   Sodium 137 135 - 145 mmol/L   Potassium 3.7 3.5 - 5.1 mmol/L   Chloride 101 98 - 111 mmol/L   CO2 25 22 - 32 mmol/L   Glucose, Bld 99 70 - 99 mg/dL    Comment: Glucose reference range applies only to samples taken after fasting for at least 8 hours.   BUN 12 6 - 20 mg/dL   Creatinine, Ser 1.47 0.61 - 1.24 mg/dL   Calcium 8.9 8.9 - 82.9 mg/dL   Total Protein 7.3 6.5 - 8.1 g/dL   Albumin 3.9 3.5 - 5.0 g/dL   AST 14 (L) 15 - 41 U/L   ALT 9 0 - 44 U/L   Alkaline Phosphatase 57 38 - 126 U/L   Total Bilirubin 0.5 0.0 - 1.2  mg/dL   GFR, Estimated >45 >40 mL/min    Comment: (NOTE) Calculated using the CKD-EPI Creatinine Equation (2021)    Anion gap 11 5 - 15    Comment: Performed at [facility], [address], [place], [place] [address]  CBC     Status: Abnormal   Collection Time: 12/28/23 12:17 AM  Result Value Ref Range   WBC 11.2 (H) 4.0 - 10.5 K/uL   RBC 4.83 4.22 - 5.81 MIL/uL   Hemoglobin 13.1 13.0 - 17.0 g/dL   HCT 14.7 82.9 - 56.2 %   MCV 84.1 80.0 - 100.0 fL   MCH 27.1 26.0 - 34.0 pg   MCHC 32.3 30.0 - 36.0 g/dL   RDW 13.0 86.5 -  78.4 %   Platelets 269 150 - 400 K/uL   nRBC 0.0 0.0 - 0.2 %    Comment: Performed at [facility], [address], [place], [place] [address]  Urinalysis, Routine w reflex microscopic -Urine, Clean Catch     Status: Abnormal   Collection Time: 12/28/23  8:00 AM  Result Value Ref Range   Color, Urine YELLOW YELLOW   APPearance CLEAR CLEAR   Specific Gravity, Urine >1.046 (H) 1.005 - 1.030   pH 6.0 5.0 - 8.0   Glucose, UA NEGATIVE NEGATIVE mg/dL   Hgb urine dipstick NEGATIVE NEGATIVE   Bilirubin Urine NEGATIVE NEGATIVE   Ketones, ur 80 (A) NEGATIVE mg/dL   Protein, ur NEGATIVE NEGATIVE mg/dL   Nitrite NEGATIVE NEGATIVE   Leukocytes,Ua NEGATIVE NEGATIVE    Comment: Performed at [facility], [address] [place], [place] [address]    DG Abdomen 1 View Result Date: 12/28/2023 CLINICAL DATA:  23 year old male enteric tube placement. EXAM: ABDOMEN - 1 VIEW COMPARISON:  CTA CT Chest, Abdomen, and Pelvis 0325 hours today. FINDINGS: AP view at 0609 hours. Enteric tube placed into the stomach, side hole the level of the gastric fundus. Contrast being excreted into nondilated renal collecting systems and ureters. Contrast partially visible in the urinary bladder. Stable bowel gas pattern. IMPRESSION: Satisfactory enteric tube placement into the stomach. Electronically Signed   By: Odessa Fleming M.D.   On: 12/28/2023 07:09   CT Angio Chest/Abd/Pel for Dissection W and/or W/WO Result Date: 12/28/2023 CLINICAL DATA:  Nausea and vomiting. History of bowel resection. Crohn's disease. Abdominal pain. EXAM: CT ANGIOGRAPHY CHEST, ABDOMEN AND PELVIS TECHNIQUE: Non-contrast CT of the chest was initially obtained. Multidetector CT imaging through the chest, abdomen and pelvis was performed using the standard protocol during bolus administration of intravenous contrast. Multiplanar reconstructed images and MIPs were obtained and reviewed to evaluate the vascular anatomy. RADIATION  DOSE REDUCTION: This exam was performed according to the departmental dose-optimization program which includes automated exposure control, adjustment of the mA and/or kV according to patient size and/or use of iterative reconstruction technique. CONTRAST:  OMNIPAQUE IOHEXOL 350 MG/ML SOLN COMPARISON:  Radiographs 12/29/2012 and 12/28/2012 FINDINGS: CTA CHEST FINDINGS Cardiovascular: Normal heart size. No pericardial effusion. No pulmonary embolism. No acute aortic syndrome. Mediastinum/Nodes: Trachea and esophagus are unremarkable. No thoracic adenopathy. Lungs/Pleura: The lungs are clear. No pleural effusion or pneumothorax. Musculoskeletal: No acute fracture. Review of the MIP images confirms the above findings. CTA ABDOMEN AND PELVIS FINDINGS VASCULAR Aorta: Normal caliber aorta without aneurysm, dissection, vasculitis or significant stenosis. Celiac: Patent without evidence of aneurysm, dissection, vasculitis or significant stenosis. SMA: Patent without evidence of aneurysm, dissection, vasculitis or significant stenosis. Renals: Both renal arteries  are patent without evidence of aneurysm, dissection, vasculitis, fibromuscular dysplasia or significant stenosis. IMA: Patent without evidence of aneurysm, dissection, vasculitis or significant stenosis. Inflow: Patent without evidence of aneurysm, dissection, vasculitis or significant stenosis. Veins: The IVC and SMV are not opacified favored due to contrast timing. Grossly patent portal and splenic veins. Review of the MIP images confirms the above findings. NON-VASCULAR Hepatobiliary: No acute abnormality. Pancreas: No acute abnormality. Spleen: Unremarkable. Adrenals/Urinary Tract: Unremarkable adrenal glands and kidneys. No hydronephrosis. Unremarkable bladder. Stomach/Bowel: Postoperative change of partial colectomy with anastomosis in the left upper quadrant. The stomach is within normal limits. Multiple dilated loops of small bowel in the pelvis  measuring up to 4.6 cm in diameter. Between the dilated loops of small bowel there are multiple segments of small-bowel wall thickening with mucosal hyperenhancement. Findings are suspicious for partial small bowel obstruction due to inflamed strictures. The appendix is not clearly visualized. Lymphatic: Borderline enlarged mesenteric lymph nodes are likely reactive. Reproductive: Unremarkable. Other: Small volume free fluid in the pelvis. No free intraperitoneal air. Musculoskeletal: No acute fracture. Review of the MIP images confirms the above findings. IMPRESSION: 1. Multiple dilated loops of small bowel in the pelvis measuring up to 4.6 cm in diameter. Between the dilated segments of small bowel there are multiple segments of small-bowel wall thickening with mucosal hyperenhancement. Findings are suspicious for partial small bowel obstruction due to enteritis and inflamed strictures. 2. Small volume free fluid in the pelvis. No abscess or free intraperitoneal air. 3. No acute aortic syndrome. No pulmonary embolism. Electronically Signed   By: Minerva Fester M.D.   On: 12/28/2023 03:58    Review of Systems  Constitutional:  Positive for activity change, appetite change and unexpected weight change.  HENT: Negative.    Eyes: Negative.   Respiratory: Negative.    Cardiovascular: Negative.   Gastrointestinal:  Positive for abdominal pain, nausea and vomiting. Negative for abdominal distention, anal bleeding, blood in stool, constipation and diarrhea.  Endocrine: Negative.   Genitourinary:  Positive for testicular pain.  Allergic/Immunologic: Negative.   Neurological: Negative.   Hematological: Negative.   Psychiatric/Behavioral:  Positive for agitation. The patient is nervous/anxious.    Blood pressure 126/71, pulse 64, temperature 97.7 F (36.5 C), temperature source Oral, resp. rate 18, SpO2 97%. Physical Exam Constitutional:      General: He is in acute distress.     Appearance: He is  ill-appearing and toxic-appearing.  HENT:     Head: Normocephalic and atraumatic.  Cardiovascular:     Rate and Rhythm: Normal rate and regular rhythm.  Pulmonary:     Effort: Pulmonary effort is normal.     Breath sounds: Normal breath sounds.  Abdominal:     General: Abdomen is flat. Bowel sounds are decreased. There is no distension.     Palpations: Abdomen is soft.     Tenderness: There is generalized abdominal tenderness.  Skin:    General: Skin is warm and dry.  Neurological:     General: No focal deficit present.     Mental Status: He is alert and oriented to person, place, and time.  Assessment/Plan: Crohn's colitis/ileitis-multiple dilated loops of small bowel in the pelvis measuring 4.6 cms- between dilated loops of small bowel multiple segments of small bowel wall thickening with mucosal hyperenhancement suspicious for partial small bowel obstruction due to enteritis and inflamed strictures; will start IV Solu-Medrol now hoping for some resolution of the acute small bowel inflammation.  Charna Elizabeth 12/28/2023, 12:43 PM

## 2023-12-28 NOTE — ED Notes (Signed)
 Patient in restroom dry heaving at this time. Provider notified. No new orders at this time

## 2023-12-28 NOTE — ED Notes (Signed)
 Provider Schuman ok'd for patient to be hooked up to suction at this time.

## 2023-12-28 NOTE — ED Notes (Signed)
 Patient reported no relief with morphine. Provider notified.

## 2023-12-28 NOTE — H&P (Signed)
 History and Physical    Manuel [name] NWG:[id number] DOB: 2001/06/05 DOA: 12/27/2023  PCP: Waldon Merl, PA-C Patient coming from: Home  Chief Complaint: Abdominal pain  HPI: Manuel [name] is a 23 y.o. male with medical history significant of Crohn's disease on immunosuppression, Asperger's followed by outpatient GI comes to the hospital complains of abdominal pain and vomiting.  Patient states he started experiencing abdominal pain along with some nausea and vomiting over last 48 hours.  This continued to worsen.  He has not really been able to pass gas during this time.  In the ER EDP consulted GI, Dr. Elnoria Howard will see the patient.  NG tube was placed and admitted to the hospital.  When I saw the patient he did not have any complaints as he had just received pain meds.   Review of Systems: As per HPI otherwise 10 point review of systems negative.  Review of Systems Otherwise negative except as per HPI, including: General: Denies fever, chills, night sweats or unintended weight loss. Resp: Denies cough, wheezing, shortness of breath. Cardiac: Denies chest pain, palpitations, orthopnea, paroxysmal nocturnal dyspnea. GI: Denies constipation GU: Denies dysuria, frequency, hesitancy or incontinence MS: Denies muscle aches, joint pain or swelling Neuro: Denies headache, neurologic deficits (focal weakness, numbness, tingling), abnormal gait Psych: Denies anxiety, depression, SI/HI/AVH Skin: Denies new rashes or lesions ID: Denies sick contacts, exotic exposures, travel  Past Medical History:  Diagnosis Date   Allergy    Anxiety    Constipation since about age 40   Depression    Eczema     Past Surgical History:  Procedure Laterality Date   BOWEL RESECTION  07/2015   COLONOSCOPY N/A 12/29/2012   Procedure: COLONOSCOPY;  Surgeon: Jon Gills, MD;  Location: [facility] OR;  Service: Gastroenterology;  Laterality: N/A;   COLONOSCOPY N/A 12/29/2012   Procedure: COLONOSCOPY;  Surgeon:  Jon Gills, MD;  Location: [facility] OR;  Service: Gastroenterology;  Laterality: N/A;   ESOPHAGOGASTRODUODENOSCOPY N/A 12/29/2012   Procedure: ESOPHAGOGASTRODUODENOSCOPY (EGD);  Surgeon: Jon Gills, MD;  Location: [facility] OR;  Service: Gastroenterology;  Laterality: N/A;   ESOPHAGOGASTRODUODENOSCOPY Bilateral 12/29/2012   Procedure: ESOPHAGOGASTRODUODENOSCOPY (EGD);  Surgeon: Jon Gills, MD;  Location: [facility] OR;  Service: Gastroenterology;  Laterality: Bilateral;    SOCIAL HISTORY:  reports that he has never smoked. He has never used smokeless tobacco. He reports current alcohol use of about 8.0 standard drinks of alcohol per week. He reports current drug use. Drug: Marijuana.  No Known Allergies  FAMILY HISTORY: Family History  Problem Relation Age of Onset   Depression Father    Birth defects Maternal Grandmother    Alcohol abuse Maternal Grandfather    Birth defects Maternal Grandfather      Prior to Admission medications   Medication Sig Start Date End Date Taking? Authorizing Provider  dicyclomine (BENTYL) 20 MG tablet Take 1 tablet by mouth 4 (four) times daily. 08/25/23  Yes [provider]  ondansetron (ZOFRAN-ODT) 4 MG disintegrating tablet Take 4 mg by mouth every 8 (eight) hours as needed for vomiting or nausea. 10/05/23  Yes [provider]  SKYRIZI 360 MG/2.4ML SOCT Inject 360 mg into the skin See admin instructions. Every 8 weeks 09/26/23  Yes [provider]  folic acid (FOLVITE) 800 MCG tablet Take by mouth. Patient not taking: Reported on 12/28/2023    [provider]  lidocaine-prilocaine (EMLA) cream Apply topically. Patient not taking: Reported on 12/28/2023 09/02/15   [provider]  Melatonin  5 MG LOZG Place 1 lozenge under the tongue at bedtime as needed. Patient not taking: Reported on 12/28/2023    [provider]  triamcinolone (KENALOG) 0.1 % Apply 1 application topically 2 (two) times daily. Patient not taking:  Reported on 12/28/2023 09/16/20   Waldon Merl, [place]    Physical Exam: Vitals:   12/27/23 2343 12/28/23 0339 12/28/23 0737 12/28/23 1000  BP:  122/64 (!) 149/86 126/71  Pulse:  72 82 64  Resp:  18  18  Temp: 98 F (36.7 C) 98.8 F (37.1 C) 97.8 F (36.6 C)   TempSrc:  Oral Oral   SpO2:  98% (!) 81% 97%      Constitutional: NAD, calm, comfortable Eyes: PERRL, lids and conjunctivae normal ENMT: Mucous membranes are moist. Posterior pharynx clear of any exudate or lesions.Normal dentition.  Neck: normal, supple, no masses, no thyromegaly Respiratory: clear to auscultation bilaterally, no wheezing, no crackles. Normal respiratory effort. No accessory muscle use.  Cardiovascular: Regular rate and rhythm, no murmurs / rubs / gallops. No extremity edema. 2+ pedal pulses. No carotid bruits.  Abdomen: no tenderness, no masses palpated. No hepatosplenomegaly. Bowel sounds positive.  Musculoskeletal: no clubbing / cyanosis. No joint deformity upper and lower extremities. Good ROM, no contractures. Normal muscle tone.  Skin: no rashes, lesions, ulcers. No induration Neurologic: CN 2-12 grossly intact. Sensation intact, DTR normal. Strength 5/5 in all 4.  Psychiatric: Normal judgment and insight. Alert and oriented x 3. Normal mood.  NG tube in place  There is no height or weight on file to calculate BMI.      Labs on Admission: I have personally reviewed following labs and imaging studies  CBC: Recent Labs  Lab 12/28/23 0017  WBC 11.2*  HGB 13.1  HCT 40.6  MCV 84.1  PLT 269   Basic Metabolic Panel: Recent Labs  Lab 12/28/23 0017  NA 137  K 3.7  CL 101  CO2 25  GLUCOSE 99  BUN 12  CREATININE 0.84  CALCIUM 8.9   GFR: CrCl cannot be calculated (Unknown ideal weight.). Liver Function Tests: Recent Labs  Lab 12/28/23 0017  AST 14*  ALT 9  ALKPHOS 57  BILITOT 0.5  PROT 7.3  ALBUMIN 3.9   No results for input(s): "LIPASE", "AMYLASE" in the last 168  hours. No results for input(s): "AMMONIA" in the last 168 hours. Coagulation Profile: No results for input(s): "INR", "PROTIME" in the last 168 hours. Cardiac Enzymes: No results for input(s): "CKTOTAL", "CKMB", "CKMBINDEX", "TROPONINI" in the last 168 hours. BNP (last 3 results) No results for input(s): "PROBNP" in the last 8760 hours. HbA1C: No results for input(s): "HGBA1C" in the last 72 hours. CBG: No results for input(s): "GLUCAP" in the last 168 hours. Lipid Profile: No results for input(s): "CHOL", "HDL", "LDLCALC", "TRIG", "CHOLHDL", "LDLDIRECT" in the last 72 hours. Thyroid Function Tests: No results for input(s): "TSH", "T4TOTAL", "FREET4", "T3FREE", "THYROIDAB" in the last 72 hours. Anemia Panel: No results for input(s): "VITAMINB12", "FOLATE", "FERRITIN", "TIBC", "IRON", "RETICCTPCT" in the last 72 hours. Urine analysis:    Component Value Date/Time   COLORURINE YELLOW 12/28/2023 0800   APPEARANCEUR CLEAR 12/28/2023 0800   LABSPEC >1.046 (H) 12/28/2023 0800   PHURINE 6.0 12/28/2023 0800   GLUCOSEU NEGATIVE 12/28/2023 0800   HGBUR NEGATIVE 12/28/2023 0800   BILIRUBINUR NEGATIVE 12/28/2023 0800   KETONESUR 80 (A) 12/28/2023 0800   PROTEINUR NEGATIVE 12/28/2023 0800   NITRITE NEGATIVE 12/28/2023 0800   LEUKOCYTESUR NEGATIVE 12/28/2023 0800  Sepsis Labs: !!!!!!!!!!!!!!!!!!!!!!!!!!!!!!!!!!!!!!!!!!!! @LABRCNTIP (procalcitonin:4,lacticidven:4) )No results found for this or any previous visit (from the past 240 hours).   Radiological Exams on Admission: DG Abdomen 1 View Result Date: 12/28/2023 CLINICAL DATA:  23 year old male enteric tube placement. EXAM: ABDOMEN - 1 VIEW COMPARISON:  CTA CT Chest, Abdomen, and Pelvis 0325 hours today. FINDINGS: AP view at 0609 hours. Enteric tube placed into the stomach, side hole the level of the gastric fundus. Contrast being excreted into nondilated renal collecting systems and ureters. Contrast partially visible in the urinary  bladder. Stable bowel gas pattern. IMPRESSION: Satisfactory enteric tube placement into the stomach. Electronically Signed   By: Odessa Fleming M.D.   On: 12/28/2023 07:09   CT Angio Chest/Abd/Pel for Dissection W and/or W/WO Result Date: 12/28/2023 CLINICAL DATA:  Nausea and vomiting. History of bowel resection. Crohn's disease. Abdominal pain. EXAM: CT ANGIOGRAPHY CHEST, ABDOMEN AND PELVIS TECHNIQUE: Non-contrast CT of the chest was initially obtained. Multidetector CT imaging through the chest, abdomen and pelvis was performed using the standard protocol during bolus administration of intravenous contrast. Multiplanar reconstructed images and MIPs were obtained and reviewed to evaluate the vascular anatomy. RADIATION DOSE REDUCTION: This exam was performed according to the departmental dose-optimization program which includes automated exposure control, adjustment of the mA and/or kV according to patient size and/or use of iterative reconstruction technique. CONTRAST:  OMNIPAQUE IOHEXOL 350 MG/ML SOLN COMPARISON:  Radiographs 12/29/2012 and 12/28/2012 FINDINGS: CTA CHEST FINDINGS Cardiovascular: Normal heart size. No pericardial effusion. No pulmonary embolism. No acute aortic syndrome. Mediastinum/Nodes: Trachea and esophagus are unremarkable. No thoracic adenopathy. Lungs/Pleura: The lungs are clear. No pleural effusion or pneumothorax. Musculoskeletal: No acute fracture. Review of the MIP images confirms the above findings. CTA ABDOMEN AND PELVIS FINDINGS VASCULAR Aorta: Normal caliber aorta without aneurysm, dissection, vasculitis or significant stenosis. Celiac: Patent without evidence of aneurysm, dissection, vasculitis or significant stenosis. SMA: Patent without evidence of aneurysm, dissection, vasculitis or significant stenosis. Renals: Both renal arteries are patent without evidence of aneurysm, dissection, vasculitis, fibromuscular dysplasia or significant stenosis. IMA: Patent without evidence of  aneurysm, dissection, vasculitis or significant stenosis. Inflow: Patent without evidence of aneurysm, dissection, vasculitis or significant stenosis. Veins: The IVC and SMV are not opacified favored due to contrast timing. Grossly patent portal and splenic veins. Review of the MIP images confirms the above findings. NON-VASCULAR Hepatobiliary: No acute abnormality. Pancreas: No acute abnormality. Spleen: Unremarkable. Adrenals/Urinary Tract: Unremarkable adrenal glands and kidneys. No hydronephrosis. Unremarkable bladder. Stomach/Bowel: Postoperative change of partial colectomy with anastomosis in the left upper quadrant. The stomach is within normal limits. Multiple dilated loops of small bowel in the pelvis measuring up to 4.6 cm in diameter. Between the dilated loops of small bowel there are multiple segments of small-bowel wall thickening with mucosal hyperenhancement. Findings are suspicious for partial small bowel obstruction due to inflamed strictures. The appendix is not clearly visualized. Lymphatic: Borderline enlarged mesenteric lymph nodes are likely reactive. Reproductive: Unremarkable. Other: Small volume free fluid in the pelvis. No free intraperitoneal air. Musculoskeletal: No acute fracture. Review of the MIP images confirms the above findings. IMPRESSION: 1. Multiple dilated loops of small bowel in the pelvis measuring up to 4.6 cm in diameter. Between the dilated segments of small bowel there are multiple segments of small-bowel wall thickening with mucosal hyperenhancement. Findings are suspicious for partial small bowel obstruction due to enteritis and inflamed strictures. 2. Small volume free fluid in the pelvis. No abscess or free intraperitoneal air. 3. No acute aortic syndrome.  No pulmonary embolism. Electronically Signed   By: Minerva Fester M.D.   On: 12/28/2023 03:58    Nutritional status  All images have been reviewed by me personally.    Assessment/Plan Principal Problem:    SBO (small bowel obstruction) (HCC)   Abdominal pain due to partial small bowel obstruction History of Crohn's disease on immunosuppression Enteritis/inflamed strictures -Currently NG tube is in place.  Keep him n.p.o., D5 half-normal saline.  GI team consulted.  Will discuss with GI team regarding starting steroids and/or adding antibiotics.  Supportive care at this time. -Hold Skyrizi   DVT prophylaxis: Subcu heparin Code Status: Full code Family Communication: None Consults called: GI, Dr. Elnoria Howard Admission status: MedSurg admission  Status is: Observation The patient remains OBS appropriate and will d/c before 2 midnights.   Time Spent: 65 minutes.  >50% of the time was devoted to discussing the patients care, assessment, plan and disposition with other care givers along with counseling the patient about the risks and benefits of treatment.    Miguel Rota MD [facility]  If 7PM-7AM, please contact night-coverage   12/28/2023, 10:51 AM

## 2023-12-28 NOTE — ED Notes (Signed)
 NG tube in place at this time. X ray to come and check placement before use

## 2023-12-29 DIAGNOSIS — K56609 Unspecified intestinal obstruction, unspecified as to partial versus complete obstruction: Secondary | ICD-10-CM | POA: Diagnosis not present

## 2023-12-29 LAB — BASIC METABOLIC PANEL
Anion gap: 12 (ref 5–15)
BUN: 9 mg/dL (ref 6–20)
CO2: 25 mmol/L (ref 22–32)
Calcium: 9 mg/dL (ref 8.9–10.3)
Chloride: 102 mmol/L (ref 98–111)
Creatinine, Ser: 0.71 mg/dL (ref 0.61–1.24)
GFR, Estimated: >60 mL/min (ref >60)
Glucose, Bld: 124 mg/dL — ABNORMAL HIGH (ref 70–99)
Potassium: 4 mmol/L (ref 3.5–5.1)
Sodium: 139 mmol/L (ref 135–145)

## 2023-12-29 LAB — CBC
HCT: 36.9 % — ABNORMAL LOW (ref 39.0–52.0)
Hemoglobin: 12 g/dL — ABNORMAL LOW (ref 13.0–17.0)
MCH: 27.4 pg (ref 26.0–34.0)
MCHC: 32.5 g/dL (ref 30.0–36.0)
MCV: 84.2 fL (ref 80.0–100.0)
Platelets: 261 10*3/uL (ref 150–400)
RBC: 4.38 MIL/uL (ref 4.22–5.81)
RDW: 13.7 % (ref 11.5–15.5)
WBC: 6.2 10*3/uL (ref 4.0–10.5)
nRBC: 0 % (ref 0.0–0.2)

## 2023-12-29 LAB — C-REACTIVE PROTEIN: CRP: 1.9 mg/dL — ABNORMAL HIGH (ref <1.0)

## 2023-12-29 LAB — PHOSPHORUS: Phosphorus: 3.7 mg/dL (ref 2.5–4.6)

## 2023-12-29 MED ORDER — DEXTROSE-SODIUM CHLORIDE 5-0.45 % IV SOLN
INTRAVENOUS | Status: AC
Start: 2023-12-29 — End: 2023-12-30

## 2023-12-29 NOTE — Hospital Course (Addendum)
 Brief Narrative:   23 y.o. male with medical history significant of Crohn's disease on immunosuppression, Asperger's followed by outpatient GI comes to the hospital complains of abdominal pain and vomiting.  Admitted for Crohn's inflammation possibly causing partial SBO therefore started on steroids.  GI team is following.   Assessment & Plan:  Principal Problem:   SBO (small bowel obstruction) (HCC)    Abdominal pain due to partial small bowel obstruction History of Crohn's disease on immunosuppression Enteritis/inflamed strictures -Currently NG tube is in place.  Keep him n.p.o., D5 half-normal saline.  GI team following.  Currently on IV fluids.  Solu-Medrol started.  Repeat abdominal x-ray tomorrow morning - Eventually GI planning on increasing frequency of Skyrizi     DVT prophylaxis: SQ heparin    Code Status: Full Code Family Communication:   Status is: Inpatient Remains inpatient appropriate because: Ongoing management for partial SBO    Subjective: Feels okay no complaints. Passing minimal gas  Examination:  General exam: Appears calm and comfortable  Respiratory system: Clear to auscultation. Respiratory effort normal. Cardiovascular system: S1 & S2 heard, RRR. No JVD, murmurs, rubs, gallops or clicks. No pedal edema. Gastrointestinal system: Abdomen is nondistended, soft and nontender. No organomegaly or masses felt. Normal bowel sounds heard. Central nervous system: Alert and oriented. No focal neurological deficits. Extremities: Symmetric 5 x 5 power. Skin: No rashes, lesions or ulcers Psychiatry: Judgement and insight appear normal. Mood & affect appropriate.

## 2023-12-29 NOTE — Progress Notes (Signed)
 Subjective: Feeling better.  He feels that the NG tube is very beneficial.  With pain medication his pain dropped from an 8 down to a 4.  Objective: Vital signs in last 24 hours: Temp:  [97.6 F (36.4 C)-98.3 F (36.8 C)] 97.7 F (36.5 C) (03/06 1359) Pulse Rate:  [44-65] 44 (03/06 1359) Resp:  [15-18] 18 (03/06 1359) BP: (99-114)/(61-66) 101/61 (03/06 1359) SpO2:  [95 %-98 %] 97 % (03/06 1359) Last BM Date : 12/27/23  Intake/Output from previous day: 03/05 0701 - 03/06 0700 In: 2440.7 [I.V.:1274.3; NG/GT:120; IV Piggyback:1046.3] Out: 1250 [Urine:350; Emesis/NG output:900] Intake/Output this shift: Total I/O In: 640.5 [I.V.:640.5] Out: 900 [Urine:200; Emesis/NG output:700]  General appearance: alert and no distress GI: flat, soft, nontender, minimal bowel sounds  Lab Results: Recent Labs    12/28/23 0017 12/28/23 1420 12/29/23 0451  WBC 11.2* 7.7 6.2  HGB 13.1 12.3* 12.0*  HCT 40.6 39.4 36.9*  PLT 269 244 261   BMET Recent Labs    12/28/23 0017 12/28/23 1420 12/29/23 0451  NA 137  --  139  K 3.7  --  4.0  CL 101  --  102  CO2 25  --  25  GLUCOSE 99  --  124*  BUN 12  --  9  CREATININE 0.84 0.81 0.71  CALCIUM 8.9  --  9.0   LFT Recent Labs    12/28/23 0017  PROT 7.3  ALBUMIN 3.9  AST 14*  ALT 9  ALKPHOS 57  BILITOT 0.5   PT/INR No results for input(s): "LABPROT", "INR" in the last 72 hours. Hepatitis Panel No results for input(s): "HEPBSAG", "HCVAB", "HEPAIGM", "HEPBIGM" in the last 72 hours. C-Diff No results for input(s): "CDIFFTOX" in the last 72 hours. Fecal Lactopherrin No results for input(s): "FECLLACTOFRN" in the last 72 hours.  Studies/Results: DG Abdomen 1 View Result Date: 12/28/2023 CLINICAL DATA:  23 year old male enteric tube placement. EXAM: ABDOMEN - 1 VIEW COMPARISON:  CTA CT Chest, Abdomen, and Pelvis 0325 hours today. FINDINGS: AP view at 0609 hours. Enteric tube placed into the stomach, side hole the level of the gastric  fundus. Contrast being excreted into nondilated renal collecting systems and ureters. Contrast partially visible in the urinary bladder. Stable bowel gas pattern. IMPRESSION: Satisfactory enteric tube placement into the stomach. Electronically Signed   By: Odessa Fleming M.D.   On: 12/28/2023 07:09   CT Angio Chest/Abd/Pel for Dissection W and/or W/WO Result Date: 12/28/2023 CLINICAL DATA:  Nausea and vomiting. History of bowel resection. Crohn's disease. Abdominal pain. EXAM: CT ANGIOGRAPHY CHEST, ABDOMEN AND PELVIS TECHNIQUE: Non-contrast CT of the chest was initially obtained. Multidetector CT imaging through the chest, abdomen and pelvis was performed using the standard protocol during bolus administration of intravenous contrast. Multiplanar reconstructed images and MIPs were obtained and reviewed to evaluate the vascular anatomy. RADIATION DOSE REDUCTION: This exam was performed according to the departmental dose-optimization program which includes automated exposure control, adjustment of the mA and/or kV according to patient size and/or use of iterative reconstruction technique. CONTRAST:  OMNIPAQUE IOHEXOL 350 MG/ML SOLN COMPARISON:  Radiographs 12/29/2012 and 12/28/2012 FINDINGS: CTA CHEST FINDINGS Cardiovascular: Normal heart size. No pericardial effusion. No pulmonary embolism. No acute aortic syndrome. Mediastinum/Nodes: Trachea and esophagus are unremarkable. No thoracic adenopathy. Lungs/Pleura: The lungs are clear. No pleural effusion or pneumothorax. Musculoskeletal: No acute fracture. Review of the MIP images confirms the above findings. CTA ABDOMEN AND PELVIS FINDINGS VASCULAR Aorta: Normal caliber aorta without aneurysm, dissection, vasculitis or  significant stenosis. Celiac: Patent without evidence of aneurysm, dissection, vasculitis or significant stenosis. SMA: Patent without evidence of aneurysm, dissection, vasculitis or significant stenosis. Renals: Both renal arteries are patent without  evidence of aneurysm, dissection, vasculitis, fibromuscular dysplasia or significant stenosis. IMA: Patent without evidence of aneurysm, dissection, vasculitis or significant stenosis. Inflow: Patent without evidence of aneurysm, dissection, vasculitis or significant stenosis. Veins: The IVC and SMV are not opacified favored due to contrast timing. Grossly patent portal and splenic veins. Review of the MIP images confirms the above findings. NON-VASCULAR Hepatobiliary: No acute abnormality. Pancreas: No acute abnormality. Spleen: Unremarkable. Adrenals/Urinary Tract: Unremarkable adrenal glands and kidneys. No hydronephrosis. Unremarkable bladder. Stomach/Bowel: Postoperative change of partial colectomy with anastomosis in the left upper quadrant. The stomach is within normal limits. Multiple dilated loops of small bowel in the pelvis measuring up to 4.6 cm in diameter. Between the dilated loops of small bowel there are multiple segments of small-bowel wall thickening with mucosal hyperenhancement. Findings are suspicious for partial small bowel obstruction due to inflamed strictures. The appendix is not clearly visualized. Lymphatic: Borderline enlarged mesenteric lymph nodes are likely reactive. Reproductive: Unremarkable. Other: Small volume free fluid in the pelvis. No free intraperitoneal air. Musculoskeletal: No acute fracture. Review of the MIP images confirms the above findings. IMPRESSION: 1. Multiple dilated loops of small bowel in the pelvis measuring up to 4.6 cm in diameter. Between the dilated segments of small bowel there are multiple segments of small-bowel wall thickening with mucosal hyperenhancement. Findings are suspicious for partial small bowel obstruction due to enteritis and inflamed strictures. 2. Small volume free fluid in the pelvis. No abscess or free intraperitoneal air. 3. No acute aortic syndrome. No pulmonary embolism. Electronically Signed   By: Minerva Fester M.D.   On: 12/28/2023  03:58    Medications: Scheduled:  heparin  5,000 Units Subcutaneous Q8H   methylPREDNISolone (SOLU-MEDROL) injection  40 mg Intravenous Q12H   Continuous:  dextrose 5 % and 0.45 % NaCl 75 mL/hr at 12/29/23 1259    Assessment/Plan: 1) SBO. 2) Crohn's disease.   He is progressing.  The abdomen decompression and there was a large volume of aspirate.  His abdominal pain is being adequately controlled.  Plan: 1) Continue Solu-Medrol 40 mg Q12 hours. 2) Continue with NG tube for now. 3) Once his pain level decreases without pain medications and there are more bowel sounds/flatus/bowel movements, his diet can be slowly advanced.  LOS: 1 day   Caswell Alvillar D 12/29/2023, 5:47 PM

## 2023-12-29 NOTE — Plan of Care (Signed)

## 2023-12-29 NOTE — Progress Notes (Signed)
 PROGRESS NOTE    Manuel [name]  ZOX:[id number] DOB: [birth year] DOA: 12/27/2023 PCP: No primary care provider on file.    Brief Narrative:   22 y.o. male with medical history significant of Crohn's disease on immunosuppression, Asperger's followed by outpatient GI comes to the hospital complains of abdominal pain and vomiting.  Admitted for Crohn's inflammation possibly causing partial SBO therefore started on steroids.  GI team is following.   Assessment & Plan:  Principal Problem:   SBO (small bowel obstruction) (HCC)    Abdominal pain due to partial small bowel obstruction History of Crohn's disease on immunosuppression Enteritis/inflamed strictures -Currently NG tube is in place.  Keep him n.p.o., D5 half-normal saline.  GI team following.  Currently on IV fluids.  Solu-Medrol started.  Repeat abdominal x-ray tomorrow morning - Eventually GI planning on increasing frequency of Skyrizi     DVT prophylaxis: SQ heparin    Code Status: Full Code Family Communication:   Status is: Inpatient Remains inpatient appropriate because: Ongoing management for partial SBO    Subjective: Feels okay no complaints. Passing minimal gas  Examination:  General exam: Appears calm and comfortable  Respiratory system: Clear to auscultation. Respiratory effort normal. Cardiovascular system: S1 & S2 heard, RRR. No JVD, murmurs, rubs, gallops or clicks. No pedal edema. Gastrointestinal system: Abdomen is nondistended, soft and nontender. No organomegaly or masses felt. Normal bowel sounds heard. Central nervous system: Alert and oriented. No focal neurological deficits. Extremities: Symmetric 5 x 5 power. Skin: No rashes, lesions or ulcers Psychiatry: Judgement and insight appear normal. Mood & affect appropriate.                Diet Orders (From admission, onward)     Start     Ordered   12/28/23 0906  Diet NPO time specified  Diet effective now        12/28/23 0906             Objective: Vitals:   12/28/23 1805 12/28/23 2244 12/29/23 0633 12/29/23 1010  BP: 107/62 114/62 99/62 112/66  Pulse: (!) 48 (!) 59 (!) 56 65  Resp: 18 16 15 16   Temp: 98 F (36.7 C) 98.3 F (36.8 C) 97.6 F (36.4 C) 98.1 F (36.7 C)  TempSrc: Oral Oral Oral Oral  SpO2: 98% 98% 95% 96%  Weight:      Height:        Intake/Output Summary (Last 24 hours) at 12/29/2023 1126 Last data filed at 12/29/2023 1000 Gross per 24 hour  Intake 1728.12 ml  Output 1650 ml  Net 78.12 ml   Filed Weights   12/28/23 1426  Weight: 67.8 kg    Scheduled Meds:  heparin  5,000 Units Subcutaneous Q8H   methylPREDNISolone (SOLU-MEDROL) injection  40 mg Intravenous Q12H   Continuous Infusions:  dextrose 5 % and 0.45 % NaCl 75 mL/hr at 12/29/23 1017    Nutritional status     Body mass index is 18.69 kg/m.  Data Reviewed:   CBC: Recent Labs  Lab 12/28/23 0017 12/28/23 1420 12/29/23 0451  WBC 11.2* 7.7 6.2  HGB 13.1 12.3* 12.0*  HCT 40.6 39.4 36.9*  MCV 84.1 84.9 84.2  PLT 269 244 261   Basic Metabolic Panel: Recent Labs  Lab 12/28/23 0017 12/28/23 1420 12/29/23 0451  NA 137  --  139  K 3.7  --  4.0  CL 101  --  102  CO2 25  --  25  GLUCOSE 99  --  124*  BUN 12  --  9  CREATININE 0.84 0.81 0.71  CALCIUM 8.9  --  9.0  PHOS  --   --  3.7   GFR: Estimated Creatinine Clearance: 136.5 mL/min (by C-G formula based on SCr of 0.71 mg/dL). Liver Function Tests: Recent Labs  Lab 12/28/23 0017  AST 14*  ALT 9  ALKPHOS 57  BILITOT 0.5  PROT 7.3  ALBUMIN 3.9   Recent Labs  Lab 12/28/23 1420  LIPASE 23   No results for input(s): "AMMONIA" in the last 168 hours. Coagulation Profile: No results for input(s): "INR", "PROTIME" in the last 168 hours. Cardiac Enzymes: No results for input(s): "CKTOTAL", "CKMB", "CKMBINDEX", "TROPONINI" in the last 168 hours. BNP (last 3 results) No results for input(s): "PROBNP" in the last 8760 hours. HbA1C: No results for  input(s): "HGBA1C" in the last 72 hours. CBG: No results for input(s): "GLUCAP" in the last 168 hours. Lipid Profile: No results for input(s): "CHOL", "HDL", "LDLCALC", "TRIG", "CHOLHDL", "LDLDIRECT" in the last 72 hours. Thyroid Function Tests: No results for input(s): "TSH", "T4TOTAL", "FREET4", "T3FREE", "THYROIDAB" in the last 72 hours. Anemia Panel: No results for input(s): "VITAMINB12", "FOLATE", "FERRITIN", "TIBC", "IRON", "RETICCTPCT" in the last 72 hours. Sepsis Labs: No results for input(s): "PROCALCITON", "LATICACIDVEN" in the last 168 hours.  No results found for this or any previous visit (from the past 240 hours).       Radiology Studies: DG Abdomen 1 View Result Date: 12/28/2023 CLINICAL DATA:  23 year old male enteric tube placement. EXAM: ABDOMEN - 1 VIEW COMPARISON:  CTA CT Chest, Abdomen, and Pelvis 0325 hours today. FINDINGS: AP view at 0609 hours. Enteric tube placed into the stomach, side hole the level of the gastric fundus. Contrast being excreted into nondilated renal collecting systems and ureters. Contrast partially visible in the urinary bladder. Stable bowel gas pattern. IMPRESSION: Satisfactory enteric tube placement into the stomach. Electronically Signed   By: Odessa Fleming M.D.   On: 12/28/2023 07:09   CT Angio Chest/Abd/Pel for Dissection W and/or W/WO Result Date: 12/28/2023 CLINICAL DATA:  Nausea and vomiting. History of bowel resection. Crohn's disease. Abdominal pain. EXAM: CT ANGIOGRAPHY CHEST, ABDOMEN AND PELVIS TECHNIQUE: Non-contrast CT of the chest was initially obtained. Multidetector CT imaging through the chest, abdomen and pelvis was performed using the standard protocol during bolus administration of intravenous contrast. Multiplanar reconstructed images and MIPs were obtained and reviewed to evaluate the vascular anatomy. RADIATION DOSE REDUCTION: This exam was performed according to the departmental dose-optimization program which includes automated  exposure control, adjustment of the mA and/or kV according to patient size and/or use of iterative reconstruction technique. CONTRAST:  OMNIPAQUE IOHEXOL 350 MG/ML SOLN COMPARISON:  Radiographs 12/29/2012 and 12/28/2012 FINDINGS: CTA CHEST FINDINGS Cardiovascular: Normal heart size. No pericardial effusion. No pulmonary embolism. No acute aortic syndrome. Mediastinum/Nodes: Trachea and esophagus are unremarkable. No thoracic adenopathy. Lungs/Pleura: The lungs are clear. No pleural effusion or pneumothorax. Musculoskeletal: No acute fracture. Review of the MIP images confirms the above findings. CTA ABDOMEN AND PELVIS FINDINGS VASCULAR Aorta: Normal caliber aorta without aneurysm, dissection, vasculitis or significant stenosis. Celiac: Patent without evidence of aneurysm, dissection, vasculitis or significant stenosis. SMA: Patent without evidence of aneurysm, dissection, vasculitis or significant stenosis. Renals: Both renal arteries are patent without evidence of aneurysm, dissection, vasculitis, fibromuscular dysplasia or significant stenosis. IMA: Patent without evidence of aneurysm, dissection, vasculitis or significant stenosis. Inflow: Patent without evidence of aneurysm, dissection, vasculitis or significant stenosis. Veins: The IVC  and SMV are not opacified favored due to contrast timing. Grossly patent portal and splenic veins. Review of the MIP images confirms the above findings. NON-VASCULAR Hepatobiliary: No acute abnormality. Pancreas: No acute abnormality. Spleen: Unremarkable. Adrenals/Urinary Tract: Unremarkable adrenal glands and kidneys. No hydronephrosis. Unremarkable bladder. Stomach/Bowel: Postoperative change of partial colectomy with anastomosis in the left upper quadrant. The stomach is within normal limits. Multiple dilated loops of small bowel in the pelvis measuring up to 4.6 cm in diameter. Between the dilated loops of small bowel there are multiple segments of small-bowel wall  thickening with mucosal hyperenhancement. Findings are suspicious for partial small bowel obstruction due to inflamed strictures. The appendix is not clearly visualized. Lymphatic: Borderline enlarged mesenteric lymph nodes are likely reactive. Reproductive: Unremarkable. Other: Small volume free fluid in the pelvis. No free intraperitoneal air. Musculoskeletal: No acute fracture. Review of the MIP images confirms the above findings. IMPRESSION: 1. Multiple dilated loops of small bowel in the pelvis measuring up to 4.6 cm in diameter. Between the dilated segments of small bowel there are multiple segments of small-bowel wall thickening with mucosal hyperenhancement. Findings are suspicious for partial small bowel obstruction due to enteritis and inflamed strictures. 2. Small volume free fluid in the pelvis. No abscess or free intraperitoneal air. 3. No acute aortic syndrome. No pulmonary embolism. Electronically Signed   By: Minerva Fester M.D.   On: 12/28/2023 03:58           LOS: 1 day   Time spent= 35 mins    Miguel Rota, MD [facility]  If 7PM-7AM, please contact night-coverage  12/29/2023, 11:26 AM

## 2023-12-29 NOTE — Progress Notes (Signed)
   12/29/23 1114  TOC Brief Assessment  Insurance and Status Reviewed  Patient has primary care physician Yes  Home environment has been reviewed home  Prior level of function: independent  Prior/Current Home Services No current home services  Social Drivers of Health Review SDOH reviewed no interventions necessary  Readmission risk has been reviewed Yes  Transition of care needs transition of care needs identified, TOC will continue to follow

## 2023-12-30 ENCOUNTER — Inpatient Hospital Stay (HOSPITAL_COMMUNITY)

## 2023-12-30 DIAGNOSIS — K56609 Unspecified intestinal obstruction, unspecified as to partial versus complete obstruction: Secondary | ICD-10-CM | POA: Diagnosis not present

## 2023-12-30 LAB — BASIC METABOLIC PANEL
Anion gap: 11 (ref 5–15)
BUN: 14 mg/dL (ref 6–20)
CO2: 31 mmol/L (ref 22–32)
Calcium: 8.8 mg/dL — ABNORMAL LOW (ref 8.9–10.3)
Chloride: 99 mmol/L (ref 98–111)
Creatinine, Ser: 0.71 mg/dL (ref 0.61–1.24)
GFR, Estimated: >60 mL/min (ref >60)
Glucose, Bld: 113 mg/dL — ABNORMAL HIGH (ref 70–99)
Potassium: 3.8 mmol/L (ref 3.5–5.1)
Sodium: 141 mmol/L (ref 135–145)

## 2023-12-30 LAB — CBC
HCT: 38.3 % — ABNORMAL LOW (ref 39.0–52.0)
Hemoglobin: 12.1 g/dL — ABNORMAL LOW (ref 13.0–17.0)
MCH: 26.6 pg (ref 26.0–34.0)
MCHC: 31.6 g/dL (ref 30.0–36.0)
MCV: 84.2 fL (ref 80.0–100.0)
Platelets: 244 10*3/uL (ref 150–400)
RBC: 4.55 MIL/uL (ref 4.22–5.81)
RDW: 13.8 % (ref 11.5–15.5)
WBC: 4.3 10*3/uL (ref 4.0–10.5)
nRBC: 0 % (ref 0.0–0.2)

## 2023-12-30 NOTE — Plan of Care (Signed)
   Problem: Education: Goal: Knowledge of General Education information will improve Description Including pain rating scale, medication(s)/side effects and non-pharmacologic comfort measures Outcome: Progressing   Problem: Health Behavior/Discharge Planning: Goal: Ability to manage health-related needs will improve Outcome: Progressing

## 2023-12-30 NOTE — Progress Notes (Signed)
 PROGRESS NOTE    Manuel [name]  ZOX:[id number] DOB: December 21, 2000 DOA: 12/27/2023 PCP: No primary care provider on file.   Brief Narrative:  This 23 yrs old male with PMH significant for Crohn's disease on immunosuppression, Asperger's followed by outpatient GI comes to the hospital with complains of abdominal pain and vomiting.  Admitted for Crohn's inflammation possibly causing partial SBO therefore started on steroids.  GI team is following.  Patient reports significant improvement.  Assessment & Plan:   Principal Problem:   SBO (small bowel obstruction) (HCC)  Abdominal pain due to partial small bowel obstruction: History of Crohn's disease on immunosuppression: Enteritis/ Inflamed strictures Currently NG tube is in place.   Continue Bowel rest , D5 half-normal saline.   GI team following.  Currently on IV fluids.  Solu-Medrol started.   Repeat abdominal x-ray shows no abnormal bowel dilatation. Eventually GI planning on increasing frequency of Skyrizi. Follow-up GI recommendation. Patient reports significant improvement.  Had bowel movement yesterday.   DVT prophylaxis: Heparin sq Code Status:Full code Family Communication: No family at bed side Disposition Plan:    Status is: Inpatient Remains inpatient appropriate because:  Admitted for SBO, general surgery following, not medically clear yet.   Consultants:  General surgery GI  Procedures: NG Tube  Antimicrobials: Anti-infectives (From admission, onward)    None      Subjective: Patient was seen and examined at bedside. Overnight events noted.   Patient reports doing much improved. Patient had a bowel movement yesterday has not had a bowel movement today.   Still has significant output in the NG tube.  Objective: Vitals:   12/29/23 1359 12/29/23 2049 12/29/23 2050 12/30/23 0611  BP: 101/61 (!) 106/54 (!) 106/54 (!) 100/59  Pulse: (!) 44 (!) 59  71  Resp: 18  16 18   Temp: 97.7 F (36.5 C) 97.7 F (36.5  C) 97.7 F (36.5 C) 97.6 F (36.4 C)  TempSrc: Oral Oral Oral Oral  SpO2: 97% 100% 100% 97%  Weight:      Height:        Intake/Output Summary (Last 24 hours) at 12/30/2023 1126 Last data filed at 12/30/2023 1000 Gross per 24 hour  Intake 1454.81 ml  Output 2150 ml  Net -695.19 ml   Filed Weights   12/28/23 1426  Weight: 67.8 kg    Examination:  General exam: Appears calm and comfortable, Not in distress, NG tube noted.  Respiratory system: CTA Bilaterally,  Respiratory effort normal. RR 15 Cardiovascular system: S1 & S2 heard, RRR. No JVD, murmurs, rubs, gallops or clicks. No pedal edema. Gastrointestinal system: Abdomen is non distended, soft and Mildly tender. Normal bowel sounds heard. Central nervous system: Alert and oriented X 3. No focal neurological deficits. Extremities: No edema, no cyanosis, no clubbing. Skin: No rashes, lesions or ulcers Psychiatry: Judgement and insight appear normal. Mood & affect appropriate.     Data Reviewed: I have personally reviewed following labs and imaging studies  CBC: Recent Labs  Lab 12/28/23 0017 12/28/23 1420 12/29/23 0451 12/30/23 0446  WBC 11.2* 7.7 6.2 4.3  HGB 13.1 12.3* 12.0* 12.1*  HCT 40.6 39.4 36.9* 38.3*  MCV 84.1 84.9 84.2 84.2  PLT 269 244 261 244   Basic Metabolic Panel: Recent Labs  Lab 12/28/23 0017 12/28/23 1420 12/29/23 0451 12/30/23 0446  NA 137  --  139 141  K 3.7  --  4.0 3.8  CL 101  --  102 99  CO2 25  --  25 31  GLUCOSE 99  --  124* 113*  BUN 12  --  9 14  CREATININE 0.84 0.81 0.71 0.71  CALCIUM 8.9  --  9.0 8.8*  PHOS  --   --  3.7  --    GFR: Estimated Creatinine Clearance: 136.5 mL/min (by C-G formula based on SCr of 0.71 mg/dL). Liver Function Tests: Recent Labs  Lab 12/28/23 0017  AST 14*  ALT 9  ALKPHOS 57  BILITOT 0.5  PROT 7.3  ALBUMIN 3.9   Recent Labs  Lab 12/28/23 1420  LIPASE 23   No results for input(s): "AMMONIA" in the last 168 hours. Coagulation  Profile: No results for input(s): "INR", "PROTIME" in the last 168 hours. Cardiac Enzymes: No results for input(s): "CKTOTAL", "CKMB", "CKMBINDEX", "TROPONINI" in the last 168 hours. BNP (last 3 results) No results for input(s): "PROBNP" in the last 8760 hours. HbA1C: No results for input(s): "HGBA1C" in the last 72 hours. CBG: No results for input(s): "GLUCAP" in the last 168 hours. Lipid Profile: No results for input(s): "CHOL", "HDL", "LDLCALC", "TRIG", "CHOLHDL", "LDLDIRECT" in the last 72 hours. Thyroid Function Tests: No results for input(s): "TSH", "T4TOTAL", "FREET4", "T3FREE", "THYROIDAB" in the last 72 hours. Anemia Panel: No results for input(s): "VITAMINB12", "FOLATE", "FERRITIN", "TIBC", "IRON", "RETICCTPCT" in the last 72 hours. Sepsis Labs: No results for input(s): "PROCALCITON", "LATICACIDVEN" in the last 168 hours.  No results found for this or any previous visit (from the past 240 hours).   Radiology Studies: DG Abd 1 View Result Date: 12/30/2023 CLINICAL DATA:  Small bowel obstruction. EXAM: ABDOMEN - 1 VIEW COMPARISON:  December 28, 2023. FINDINGS: The bowel gas pattern is normal. Distal tip of nasogastric tube is seen in stomach. No radio-opaque calculi or other significant radiographic abnormality are seen. IMPRESSION: No abnormal bowel dilatation. Electronically Signed   By: Lupita Raider M.D.   On: 12/30/2023 09:42    Scheduled Meds:  heparin  5,000 Units Subcutaneous Q8H   methylPREDNISolone (SOLU-MEDROL) injection  40 mg Intravenous Q12H   Continuous Infusions:   LOS: 2 days    Time spent: 50 MINS    Willeen Niece, MD [facility]   If 7PM-7AM, please contact night-coverage

## 2023-12-30 NOTE — Progress Notes (Signed)
 Subjective: He is feeling better.  He noticed some audible bowel sounds earlier this AM.  No significant passage of flatus.  Objective: Vital signs in last 24 hours: Temp:  [97.6 F (36.4 C)-97.7 F (36.5 C)] 97.7 F (36.5 C) (03/07 1305) Pulse Rate:  [41-71] 41 (03/07 1305) Resp:  [16-18] 18 (03/07 0611) BP: (100-106)/(50-59) 101/50 (03/07 1305) SpO2:  [97 %-100 %] 100 % (03/07 1305) Last BM Date : 12/27/23  Intake/Output from previous day: 03/06 0701 - 03/07 0700 In: 1819.9 [I.V.:1819.9] Out: 2150 [Urine:200; Emesis/NG output:1950] Intake/Output this shift: Total I/O In: 0  Out: 500 [Emesis/NG output:500]  General appearance: alert and no distress GI: flat, soft, nontender, nondistended  Lab Results: Recent Labs    12/28/23 1420 12/29/23 0451 12/30/23 0446  WBC 7.7 6.2 4.3  HGB 12.3* 12.0* 12.1*  HCT 39.4 36.9* 38.3*  PLT 244 261 244   BMET Recent Labs    12/28/23 0017 12/28/23 1420 12/29/23 0451 12/30/23 0446  NA 137  --  139 141  K 3.7  --  4.0 3.8  CL 101  --  102 99  CO2 25  --  25 31  GLUCOSE 99  --  124* 113*  BUN 12  --  9 14  CREATININE 0.84 0.81 0.71 0.71  CALCIUM 8.9  --  9.0 8.8*   LFT Recent Labs    12/28/23 0017  PROT 7.3  ALBUMIN 3.9  AST 14*  ALT 9  ALKPHOS 57  BILITOT 0.5   PT/INR No results for input(s): "LABPROT", "INR" in the last 72 hours. Hepatitis Panel No results for input(s): "HEPBSAG", "HCVAB", "HEPAIGM", "HEPBIGM" in the last 72 hours. C-Diff No results for input(s): "CDIFFTOX" in the last 72 hours. Fecal Lactopherrin No results for input(s): "FECLLACTOFRN" in the last 72 hours.  Studies/Results: DG Abd 1 View Result Date: 12/30/2023 CLINICAL DATA:  Small bowel obstruction. EXAM: ABDOMEN - 1 VIEW COMPARISON:  December 28, 2023. FINDINGS: The bowel gas pattern is normal. Distal tip of nasogastric tube is seen in stomach. No radio-opaque calculi or other significant radiographic abnormality are seen. IMPRESSION: No  abnormal bowel dilatation. Electronically Signed   By: Lupita Raider M.D.   On: 12/30/2023 09:42    Medications: Scheduled:  heparin  5,000 Units Subcutaneous Q8H   methylPREDNISolone (SOLU-MEDROL) injection  40 mg Intravenous Q12H   Continuous:  Assessment/Plan: 1) Crohn's disease. 2) SBO - resolved.   His SBO resolved with today's KUB.  He desires to try clear liquids and I think it is reasonable.  There is some evidence of more bowel motility.   Plan: 1) Cap NG tube. 2) Clear liquid diet. 3) Continue with Solu-Medrol. 4) [facility] will cover this weekend.  LOS: 2 days   Harmony Sandell D 12/30/2023, 3:48 PM

## 2023-12-30 NOTE — Plan of Care (Signed)
  Problem: Clinical Measurements: Goal: Cardiovascular complication will be avoided Outcome: Progressing   Problem: Activity: Goal: Risk for activity intolerance will decrease Outcome: Progressing   Problem: Nutrition: Goal: Adequate nutrition will be maintained Outcome: Progressing   Problem: Coping: Goal: Level of anxiety will decrease Outcome: Progressing   

## 2023-12-30 NOTE — Progress Notes (Addendum)
 Patient's NGT was reconnected to suction since he started to have nausea and increased pain.

## 2023-12-31 ENCOUNTER — Inpatient Hospital Stay (HOSPITAL_COMMUNITY)

## 2023-12-31 DIAGNOSIS — R194 Change in bowel habit: Secondary | ICD-10-CM

## 2023-12-31 DIAGNOSIS — R1084 Generalized abdominal pain: Secondary | ICD-10-CM | POA: Diagnosis not present

## 2023-12-31 DIAGNOSIS — K529 Noninfective gastroenteritis and colitis, unspecified: Secondary | ICD-10-CM

## 2023-12-31 DIAGNOSIS — Z8719 Personal history of other diseases of the digestive system: Secondary | ICD-10-CM

## 2023-12-31 DIAGNOSIS — K56609 Unspecified intestinal obstruction, unspecified as to partial versus complete obstruction: Secondary | ICD-10-CM | POA: Diagnosis not present

## 2023-12-31 LAB — BASIC METABOLIC PANEL WITH GFR
Anion gap: 11 (ref 5–15)
BUN: 18 mg/dL (ref 6–20)
CO2: 31 mmol/L (ref 22–32)
Calcium: 9.1 mg/dL (ref 8.9–10.3)
Chloride: 99 mmol/L (ref 98–111)
Creatinine, Ser: 0.79 mg/dL (ref 0.61–1.24)
GFR, Estimated: >60 mL/min
Glucose, Bld: 107 mg/dL — ABNORMAL HIGH (ref 70–99)
Potassium: 3.6 mmol/L (ref 3.5–5.1)
Sodium: 141 mmol/L (ref 135–145)

## 2023-12-31 LAB — CBC
HCT: 39.4 % (ref 39.0–52.0)
Hemoglobin: 12.8 g/dL — ABNORMAL LOW (ref 13.0–17.0)
MCH: 27.2 pg (ref 26.0–34.0)
MCHC: 32.5 g/dL (ref 30.0–36.0)
MCV: 83.7 fL (ref 80.0–100.0)
Platelets: 281 10*3/uL (ref 150–400)
RBC: 4.71 MIL/uL (ref 4.22–5.81)
RDW: 13.7 % (ref 11.5–15.5)
WBC: 6.5 10*3/uL (ref 4.0–10.5)
nRBC: 0 % (ref 0.0–0.2)

## 2023-12-31 LAB — SEDIMENTATION RATE: Sed Rate: 7 mm/h (ref 0–16)

## 2023-12-31 LAB — C-REACTIVE PROTEIN: CRP: 0.7 mg/dL (ref <1.0)

## 2023-12-31 NOTE — Progress Notes (Signed)
 PROGRESS NOTE    Manuel [name]  VHQ:[id number] DOB: Aug 10, 2001 DOA: 12/27/2023 PCP: No primary care provider on file.   Brief Narrative:  This 23 yrs old male with PMH significant for Crohn's disease on immunosuppression, Asperger's followed by outpatient GI comes to the hospital with complains of abdominal pain and vomiting.  Admitted for Crohn's inflammation possibly causing partial SBO therefore started on steroids.  GI team is following.  Patient reports significant improvement.  Assessment & Plan:   Principal Problem:   SBO (small bowel obstruction) (HCC)  Abdominal pain due to partial small bowel obstruction: History of Crohn's disease on immunosuppression: Enteritis/ Inflamed strictures Currently NG tube is in place.   Continue Bowel rest , D5 half-normal saline.   GI team following.  Currently on IV fluids.  Solu-Medrol started.   Repeat abdominal x-ray shows no abnormal bowel dilatation. Eventually GI planning on increasing frequency of Skyrizi. Follow-up GI recommendation. Patient reports significant improvement.  Had bowel movement yesterday. NG tube clamped but patient developed abdominal pain and nausea. Continue to monitor clinical status.   DVT prophylaxis: Heparin sq Code Status:Full code Family Communication: No family at bed side Disposition Plan:    Status is: Inpatient Remains inpatient appropriate because:  Admitted for SBO, general surgery following, not medically clear yet.   Consultants:  General surgery GI  Procedures: NG Tube  Antimicrobials: Anti-infectives (From admission, onward)    None      Subjective: Patient was seen and examined at bedside.Overnight events noted.   Patient reports doing better. NG tube was clamped but then patient developed abdominal pain and nausea. NG tube connected again.  Objective: Vitals:   12/30/23 0611 12/30/23 1305 12/30/23 2119 12/31/23 0602  BP: (!) 100/59 (!) 101/50 105/63 (!) 106/56  Pulse: 71  (!) 41 (!) 45 (!) 48  Resp: 18  18 16   Temp: 97.6 F (36.4 C) 97.7 F (36.5 C) 97.8 F (36.6 C) 98 F (36.7 C)  TempSrc: Oral Oral Oral Oral  SpO2: 97% 100% 94% 96%  Weight:      Height:        Intake/Output Summary (Last 24 hours) at 12/31/2023 1054 Last data filed at 12/31/2023 0950 Gross per 24 hour  Intake 720 ml  Output 2600 ml  Net -1880 ml   Filed Weights   12/28/23 1426  Weight: 67.8 kg    Examination:  General exam: Appears calm and comfortable, Not in distress, NG tube noted.  Respiratory system: CTA Bilaterally,  Respiratory effort normal. RR 16 Cardiovascular system: S1 & S2 heard, RRR. No JVD, murmurs, rubs, gallops or clicks. No pedal edema. Gastrointestinal system: Abdomen is non distended, soft and Mildly tender. Normal bowel sounds heard. Central nervous system: Alert and oriented X 3. No focal neurological deficits. Extremities: No edema, no cyanosis, no clubbing. Skin: No rashes, lesions or ulcers Psychiatry: Judgement and insight appear normal. Mood & affect appropriate.     Data Reviewed: I have personally reviewed following labs and imaging studies  CBC: Recent Labs  Lab 12/28/23 0017 12/28/23 1420 12/29/23 0451 12/30/23 0446 12/31/23 0518  WBC 11.2* 7.7 6.2 4.3 6.5  HGB 13.1 12.3* 12.0* 12.1* 12.8*  HCT 40.6 39.4 36.9* 38.3* 39.4  MCV 84.1 84.9 84.2 84.2 83.7  PLT 269 244 261 244 281   Basic Metabolic Panel: Recent Labs  Lab 12/28/23 0017 12/28/23 1420 12/29/23 0451 12/30/23 0446 12/31/23 0518  NA 137  --  139 141 141  K 3.7  --  4.0  3.8 3.6  CL 101  --  102 99 99  CO2 25  --  25 31 31   GLUCOSE 99  --  124* 113* 107*  BUN 12  --  9 14 18   CREATININE 0.84 0.81 0.71 0.71 0.79  CALCIUM 8.9  --  9.0 8.8* 9.1  PHOS  --   --  3.7  --   --    GFR: Estimated Creatinine Clearance: 136.5 mL/min (by C-G formula based on SCr of 0.79 mg/dL). Liver Function Tests: Recent Labs  Lab 12/28/23 0017  AST 14*  ALT 9  ALKPHOS 57  BILITOT  0.5  PROT 7.3  ALBUMIN 3.9   Recent Labs  Lab 12/28/23 1420  LIPASE 23   No results for input(s): "AMMONIA" in the last 168 hours. Coagulation Profile: No results for input(s): "INR", "PROTIME" in the last 168 hours. Cardiac Enzymes: No results for input(s): "CKTOTAL", "CKMB", "CKMBINDEX", "TROPONINI" in the last 168 hours. BNP (last 3 results) No results for input(s): "PROBNP" in the last 8760 hours. HbA1C: No results for input(s): "HGBA1C" in the last 72 hours. CBG: No results for input(s): "GLUCAP" in the last 168 hours. Lipid Profile: No results for input(s): "CHOL", "HDL", "LDLCALC", "TRIG", "CHOLHDL", "LDLDIRECT" in the last 72 hours. Thyroid Function Tests: No results for input(s): "TSH", "T4TOTAL", "FREET4", "T3FREE", "THYROIDAB" in the last 72 hours. Anemia Panel: No results for input(s): "VITAMINB12", "FOLATE", "FERRITIN", "TIBC", "IRON", "RETICCTPCT" in the last 72 hours. Sepsis Labs: No results for input(s): "PROCALCITON", "LATICACIDVEN" in the last 168 hours.  No results found for this or any previous visit (from the past 240 hours).   Radiology Studies: DG Abd 1 View Result Date: 12/30/2023 CLINICAL DATA:  Small bowel obstruction. EXAM: ABDOMEN - 1 VIEW COMPARISON:  December 28, 2023. FINDINGS: The bowel gas pattern is normal. Distal tip of nasogastric tube is seen in stomach. No radio-opaque calculi or other significant radiographic abnormality are seen. IMPRESSION: No abnormal bowel dilatation. Electronically Signed   By: Lupita Raider M.D.   On: 12/30/2023 09:42    Scheduled Meds:  heparin  5,000 Units Subcutaneous Q8H   methylPREDNISolone (SOLU-MEDROL) injection  40 mg Intravenous Q12H   Continuous Infusions:   LOS: 3 days    Time spent: 35 MINS    Willeen Niece, MD [facility]   If 7PM-7AM, please contact night-coverage

## 2023-12-31 NOTE — Plan of Care (Signed)
   Problem: Education: Goal: Knowledge of General Education information will improve Description Including pain rating scale, medication(s)/side effects and non-pharmacologic comfort measures Outcome: Progressing   Problem: Health Behavior/Discharge Planning: Goal: Ability to manage health-related needs will improve Outcome: Progressing

## 2023-12-31 NOTE — Plan of Care (Signed)

## 2023-12-31 NOTE — Progress Notes (Signed)
 Gastroenterology Inpatient Follow-up Note Covering for Dr. Mann/Dr. Elnoria Howard   PATIENT IDENTIFICATION  Manuel [name] is a 23 y.o. male Hospital Day: 5  SUBJECTIVE  The patient's chart has been reviewed.  He had 2100 mL of fluid removed via NG tube yesterday. The patient's labs been reviewed. KUB ordered is pending completion. Today, the patient is feeling somewhat better.  He has had his NG tube clamped.  He is tolerated some small gelatin with hopes that it does not cause progressive issues. He had chills last night but no overt fevers. Abdominal pain persists and is rated 7 out of 10 currently generalized throughout.  He wonders if he can have the NG tube removed.   OBJECTIVE  Scheduled Inpatient Medications:   heparin  5,000 Units Subcutaneous Q8H   methylPREDNISolone (SOLU-MEDROL) injection  40 mg Intravenous Q12H   Continuous Inpatient Infusions:  PRN Inpatient Medications: acetaminophen **OR** acetaminophen, bisacodyl, hydrALAZINE, HYDROmorphone (DILAUDID) injection, ipratropium-albuterol, metoprolol tartrate, nicotine, ondansetron **OR** ondansetron (ZOFRAN) IV, oxyCODONE, senna-docusate, sodium phosphate   Physical Examination  Temp:  [97.7 F (36.5 C)-98 F (36.7 C)] 98 F (36.7 C) (03/08 0602) Pulse Rate:  [41-48] 48 (03/08 0602) Resp:  [16-18] 16 (03/08 0602) BP: (101-106)/(50-63) 106/56 (03/08 0602) SpO2:  [94 %-100 %] 96 % (03/08 0602) Temp (24hrs), Avg:97.8 F (36.6 C), Min:97.7 F (36.5 C), Max:98 F (36.7 C)  Weight: 67.8 kg GEN: NAD, appears stated age, doesn't appear chronically ill PSYCH: Cooperative, without pressured speech EYE: Conjunctivae pink, sclerae anicteric ENT: MMM CV: Nontachycardic RESP: No audible wheezing GI: NABS, soft, hyperactive bowel sounds, tenderness to palpation throughout the abdomen, volitional guarding but no rebound  MSK/EXT: No significant lower extremity edema SKIN: No jaundice NEURO:  Alert & Oriented x 3, no focal  deficits   Review of Data   Laboratory Studies   Recent Labs  Lab 12/29/23 0451 12/30/23 0446 12/31/23 0518  NA 139   < > 141  K 4.0   < > 3.6  CL 102   < > 99  CO2 25   < > 31  [name] 9   < > 18  CREATININE 0.71   < > 0.79  GLUCOSE 124*   < > 107*  CALCIUM 9.0   < > 9.1  PHOS 3.7  --   --    < > = values in this interval not displayed.   Recent Labs  Lab 12/28/23 0017  AST 14*  ALT 9  ALKPHOS 57    Recent Labs  Lab 12/29/23 0451 12/30/23 0446 12/31/23 0518  WBC 6.2 4.3 6.5  HGB 12.0* 12.1* 12.8*  HCT 36.9* 38.3* 39.4  PLT 261 244 281   Imaging Studies  DG Abd 1 View Result Date: 12/30/2023 CLINICAL DATA:  Small bowel obstruction. EXAM: ABDOMEN - 1 VIEW COMPARISON:  December 28, 2023. FINDINGS: The bowel gas pattern is normal. Distal tip of nasogastric tube is seen in stomach. No radio-opaque calculi or other significant radiographic abnormality are seen. IMPRESSION: No abnormal bowel dilatation. Electronically Signed   By: Lupita Raider M.D.   On: 12/30/2023 09:42   GI Procedures and Studies  No new relevant studies to review   ASSESSMENT  Manuel [name] is a 23 y.o. male with a pmh significant for Crohn's disease (SB/LB-post partial colectomy and ileal resection).  Patient admitted to the hospital with abdominal pain, concern for underlying Crohn's flare with possible enteritis and bowel obstruction.  The patient is hemodynamically stable at this time.  Clinically, his laboratories have shown improvement in his inflammatory markers.  He is currently tolerating a short NG tube clamp trial though did not tolerate for long extended period of time yesterday.  I am hopeful that today will be better.  Time will tell.  Continue IV Solu-Medrol at previous dosing as per primary gastroenterologist.  If he is able to maintain his NG tube clamped without progressive abdominal pain over the course of today and tomorrow, then hopefully we can prevent him from needing surgery.  If he  fails NG tube clamping trial, would recommend surgical evaluation for them to be on board, still hoping that we can try to prevent needing surgery at this point but to be available if necessary in the future.  Discussed this in detail with the patient and his grandmother.  All patient questions were answered to the best of my ability, and the patient agrees to the aforementioned plan of action with follow-up as indicated.   PLAN/RECOMMENDATIONS  NG tube clamp trial currently - If tolerates over the next 24 hours into tomorrow then gradual addition of clear liquids can be pursued - If does not tolerate an NG tube has to be reopened, then please proceed with low intermittent wall suction and proceed with general surgery consultation nonemergent Continue Solu-Medrol 40 mg every 12 hours per primary gastroenterologist Follow-up KUB from today Repeat KUB for tomorrow morning Long-term plan for Skyrizi dosing adjustment as per Dr. Mann/Dr. Elnoria Howard if insurance approval is obtained   Please page/call with questions or concerns.   Corliss Parish, MD  Gastroenterology [facility] Office # [phone]    LOS: 3 days  Lemar Lofty  12/31/2023, 12:49 PM

## 2024-01-01 ENCOUNTER — Inpatient Hospital Stay (HOSPITAL_COMMUNITY)

## 2024-01-01 DIAGNOSIS — Z8719 Personal history of other diseases of the digestive system: Secondary | ICD-10-CM | POA: Diagnosis not present

## 2024-01-01 DIAGNOSIS — R1084 Generalized abdominal pain: Secondary | ICD-10-CM | POA: Diagnosis not present

## 2024-01-01 DIAGNOSIS — K529 Noninfective gastroenteritis and colitis, unspecified: Secondary | ICD-10-CM | POA: Diagnosis not present

## 2024-01-01 DIAGNOSIS — K56609 Unspecified intestinal obstruction, unspecified as to partial versus complete obstruction: Secondary | ICD-10-CM | POA: Diagnosis not present

## 2024-01-01 LAB — CBC
HCT: 43 % (ref 39.0–52.0)
Hemoglobin: 13.4 g/dL (ref 13.0–17.0)
MCH: 26.7 pg (ref 26.0–34.0)
MCHC: 31.2 g/dL (ref 30.0–36.0)
MCV: 85.7 fL (ref 80.0–100.0)
Platelets: 291 10*3/uL (ref 150–400)
RBC: 5.02 MIL/uL (ref 4.22–5.81)
RDW: 13.7 % (ref 11.5–15.5)
WBC: 8 10*3/uL (ref 4.0–10.5)
nRBC: 0 % (ref 0.0–0.2)

## 2024-01-01 LAB — BASIC METABOLIC PANEL
Anion gap: 10 (ref 5–15)
BUN: 16 mg/dL (ref 6–20)
CO2: 29 mmol/L (ref 22–32)
Calcium: 9.3 mg/dL (ref 8.9–10.3)
Chloride: 98 mmol/L (ref 98–111)
Creatinine, Ser: 0.79 mg/dL (ref 0.61–1.24)
GFR, Estimated: >60 mL/min (ref >60)
Glucose, Bld: 129 mg/dL — ABNORMAL HIGH (ref 70–99)
Potassium: 4.6 mmol/L (ref 3.5–5.1)
Sodium: 137 mmol/L (ref 135–145)

## 2024-01-01 NOTE — Progress Notes (Signed)
 PROGRESS NOTE    Manuel [name]  NWG:[id number] DOB: 02-27-2001 DOA: 12/27/2023 PCP: No primary care provider on file.   Brief Narrative:  This 23 yrs old male with PMH significant for Crohn's disease on immunosuppression, Asperger's followed by outpatient GI comes to the hospital with complains of abdominal pain and vomiting.  Admitted for Crohn's inflammation possibly causing partial SBO therefore started on steroids.  GI team is following.  Patient reports significant improvement.  Assessment & Plan:   Principal Problem:   SBO (small bowel obstruction) (HCC) Active Problems:   History of Crohn's disease   Enteritis   Generalized abdominal pain   Change in bowel habits  Abdominal pain due to partial small bowel obstruction: History of Crohn's disease on immunosuppression: Enteritis/ Inflamed strictures Currently NG tube is in place.   Continue Bowel rest , D5 half-normal saline.   GI team following.  Currently on IV fluids.  Solu-Medrol started.   Repeat abdominal x-ray shows no abnormal bowel dilatation. Eventually GI planning on increasing frequency of Skyrizi. Follow-up GI recommendation. Patient reports significant improvement.  Had bowel movement yesterday. 12/31/23 NG tube clamped but patient developed abdominal pain and nausea. 01/01/24: NG tube clamped, tolerated well, will start Clear liquid diet.   DVT prophylaxis: Heparin sq Code Status:Full code Family Communication: No family at bed side. Disposition Plan:    Status is: Inpatient Remains inpatient appropriate because:  Admitted for SBO, general surgery following, not medically clear yet.   Consultants:  General surgery GI  Procedures: NG Tube  Antimicrobials: Anti-infectives (From admission, onward)    None      Subjective: Patient was seen and examined at bedside. Overnight events noted.   Patient reports doing better. NG tube was clamped , Tolerated well.  Xray completed, report  pending.  Objective: Vitals:   12/31/23 0602 12/31/23 1347 12/31/23 2046 01/01/24 0545  BP: (!) 106/56 105/66 (!) 101/58 104/75  Pulse: (!) 48 (!) 54 (!) 50 63  Resp: 16 16 18 18   Temp: 98 F (36.7 C) 97.8 F (36.6 C) 97.6 F (36.4 C) 97.7 F (36.5 C)  TempSrc: Oral Oral Oral Oral  SpO2: 96% 96% 100% 100%  Weight:      Height:        Intake/Output Summary (Last 24 hours) at 01/01/2024 1135 Last data filed at 01/01/2024 1100 Gross per 24 hour  Intake 1620 ml  Output 0 ml  Net 1620 ml   Filed Weights   12/28/23 1426  Weight: 67.8 kg    Examination:  General exam: Appears comfortable, Not in any distress, NG tube noted.  Respiratory system: CTA Bilaterally,  Respiratory effort normal. RR 16 Cardiovascular system: S1 & S2 heard, RRR. No JVD, murmurs, rubs, gallops or clicks. Gastrointestinal system: Abdomen is non distended, soft and Mildly tender. Normal bowel sounds heard. Central nervous system: Alert and oriented X 3. No focal neurological deficits. Extremities: No edema, no cyanosis, no clubbing. Skin: No rashes, lesions or ulcers Psychiatry: Judgement and insight appear normal. Mood & affect appropriate.     Data Reviewed: I have personally reviewed following labs and imaging studies  CBC: Recent Labs  Lab 12/28/23 1420 12/29/23 0451 12/30/23 0446 12/31/23 0518 01/01/24 0516  WBC 7.7 6.2 4.3 6.5 8.0  HGB 12.3* 12.0* 12.1* 12.8* 13.4  HCT 39.4 36.9* 38.3* 39.4 43.0  MCV 84.9 84.2 84.2 83.7 85.7  PLT 244 261 244 281 291   Basic Metabolic Panel: Recent Labs  Lab 12/28/23 0017 12/28/23 1420 12/29/23  0451 12/30/23 0446 12/31/23 0518 01/01/24 0516  NA 137  --  139 141 141 137  K 3.7  --  4.0 3.8 3.6 4.6  CL 101  --  102 99 99 98  CO2 25  --  25 31 31 29   GLUCOSE 99  --  124* 113* 107* 129*  BUN 12  --  9 14 18 16   CREATININE 0.84 0.81 0.71 0.71 0.79 0.79  CALCIUM 8.9  --  9.0 8.8* 9.1 9.3  PHOS  --   --  3.7  --   --   --    GFR: Estimated  Creatinine Clearance: 136.5 mL/min (by C-G formula based on SCr of 0.79 mg/dL). Liver Function Tests: Recent Labs  Lab 12/28/23 0017  AST 14*  ALT 9  ALKPHOS 57  BILITOT 0.5  PROT 7.3  ALBUMIN 3.9   Recent Labs  Lab 12/28/23 1420  LIPASE 23   No results for input(s): "AMMONIA" in the last 168 hours. Coagulation Profile: No results for input(s): "INR", "PROTIME" in the last 168 hours. Cardiac Enzymes: No results for input(s): "CKTOTAL", "CKMB", "CKMBINDEX", "TROPONINI" in the last 168 hours. BNP (last 3 results) No results for input(s): "PROBNP" in the last 8760 hours. HbA1C: No results for input(s): "HGBA1C" in the last 72 hours. CBG: No results for input(s): "GLUCAP" in the last 168 hours. Lipid Profile: No results for input(s): "CHOL", "HDL", "LDLCALC", "TRIG", "CHOLHDL", "LDLDIRECT" in the last 72 hours. Thyroid Function Tests: No results for input(s): "TSH", "T4TOTAL", "FREET4", "T3FREE", "THYROIDAB" in the last 72 hours. Anemia Panel: No results for input(s): "VITAMINB12", "FOLATE", "FERRITIN", "TIBC", "IRON", "RETICCTPCT" in the last 72 hours. Sepsis Labs: No results for input(s): "PROCALCITON", "LATICACIDVEN" in the last 168 hours.  No results found for this or any previous visit (from the past 240 hours).   Radiology Studies: DG Abd 2 Views Result Date: 12/31/2023 CLINICAL DATA:  23 year old male bowel obstruction.  Crohn disease. EXAM: ABDOMEN - 2 VIEW COMPARISON:  12/30/2023 and earlier. FINDINGS: Portable AP supine view at 0905 hours. Enteric tube remains in the left upper quadrant, side hole the level of the gastric body. Non obstructed bowel gas pattern. No pneumoperitoneum is evident on this supine view. No osseous abnormality identified. IMPRESSION: 1. Stable enteric tube terminating in the stomach. 2.  Non obstructed bowel gas pattern. Electronically Signed   By: Odessa Fleming M.D.   On: 12/31/2023 12:54    Scheduled Meds:  heparin  5,000 Units Subcutaneous Q8H    methylPREDNISolone (SOLU-MEDROL) injection  40 mg Intravenous Q12H   Continuous Infusions:   LOS: 4 days    Time spent: 35 MINS    Willeen Niece, MD [facility]   If 7PM-7AM, please contact night-coverage

## 2024-01-01 NOTE — Progress Notes (Signed)
 Gastroenterology Inpatient Follow-up Note Covering for Dr. Mann/Dr. Elnoria Howard   PATIENT IDENTIFICATION  Manuel [name] is a 23 y.o. male Hospital Day: 6  SUBJECTIVE  The patient's chart has been reviewed.  He only had a total of 900 cc out of his NG tube before it looks like it was clamped and nothing overnight documented. The patient's labs have been reviewed.  Show normal blood count and normal electrolytes. KUB from this morning is pending. Today the patient is doing better. He is tolerating his clear liquid diet while being clamped. He is hopeful that improvement is on the way. Abdominal pain persists but is getting closer to his baseline.   OBJECTIVE  Scheduled Inpatient Medications:   heparin  5,000 Units Subcutaneous Q8H   methylPREDNISolone (SOLU-MEDROL) injection  40 mg Intravenous Q12H   Continuous Inpatient Infusions:  PRN Inpatient Medications: acetaminophen **OR** acetaminophen, bisacodyl, hydrALAZINE, HYDROmorphone (DILAUDID) injection, ipratropium-albuterol, metoprolol tartrate, nicotine, ondansetron **OR** ondansetron (ZOFRAN) IV, oxyCODONE, senna-docusate, sodium phosphate   Physical Examination  Temp:  [97.6 F (36.4 C)-97.7 F (36.5 C)] 97.6 F (36.4 C) (03/09 1347) Pulse Rate:  [48-63] 48 (03/09 1347) Resp:  [16-18] 16 (03/09 1347) BP: (101-104)/(58-75) 101/63 (03/09 1347) SpO2:  [100 %] 100 % (03/09 1347) Temp (24hrs), Avg:97.6 F (36.4 C), Min:97.6 F (36.4 C), Max:97.7 F (36.5 C)  Weight: 67.8 kg GEN: NAD, appears stated age, doesn't appear chronically ill PSYCH: Cooperative, without pressured speech EYE: Conjunctivae pink, sclerae anicteric ENT: MMM CV: Nontachycardic RESP: No audible wheezing GI: NABS, soft, normoactive bowel sounds, tenderness to palpation throughout the abdomen, volitional guarding (less than yesterday) and no rebound  MSK/EXT: No significant lower extremity edema SKIN: No jaundice NEURO:  Alert & Oriented x 3, no focal  deficits   Review of Data   Laboratory Studies   Recent Labs  Lab 12/29/23 0451 12/30/23 0446 01/01/24 0516  NA 139   < > 137  K 4.0   < > 4.6  CL 102   < > 98  CO2 25   < > 29  BUN 9   < > 16  CREATININE 0.71   < > 0.79  GLUCOSE 124*   < > 129*  CALCIUM 9.0   < > 9.3  PHOS 3.7  --   --    < > = values in this interval not displayed.   Recent Labs  Lab 12/28/23 0017  AST 14*  ALT 9  ALKPHOS 57    Recent Labs  Lab 12/30/23 0446 12/31/23 0518 01/01/24 0516  WBC 4.3 6.5 8.0  HGB 12.1* 12.8* 13.4  HCT 38.3* 39.4 43.0  PLT 244 281 291   Imaging Studies  DG Abd 2 Views Result Date: 01/01/2024 CLINICAL DATA:  Crohn disease.  Tube is clamped EXAM: ABDOMEN - 2 VIEW COMPARISON:  X-ray 12/31/2023 FINDINGS: Enteric tube overlying the midbody of the stomach. There is gas seen in nondilated loops of large bowel with scattered stool. There is air in nondilated loops of small bowel as well throughout the mid abdomen. No frank obstruction. No definite free air on these portable views. Air towards the rectum. Minimal curvature of the spine. IMPRESSION: Enteric tube.  Nonspecific bowel gas pattern.  Scattered stool. Electronically Signed   By: Karen Kays M.D.   On: 01/01/2024 12:31   DG Abd 2 Views Result Date: 12/31/2023 CLINICAL DATA:  23 year old male bowel obstruction.  Crohn disease. EXAM: ABDOMEN - 2 VIEW COMPARISON:  12/30/2023 and earlier. FINDINGS: Portable AP  supine view at 0905 hours. Enteric tube remains in the left upper quadrant, side hole the level of the gastric body. Non obstructed bowel gas pattern. No pneumoperitoneum is evident on this supine view. No osseous abnormality identified. IMPRESSION: 1. Stable enteric tube terminating in the stomach. 2.  Non obstructed bowel gas pattern. Electronically Signed   By: Odessa Fleming M.D.   On: 12/31/2023 12:54   GI Procedures and Studies  No new relevant studies to review   ASSESSMENT  Mr. Wence is a 23 y.o. male with a pmh  significant for Crohn's disease (SB/LB-post partial colectomy and ileal resection).  Patient admitted to the hospital with abdominal pain, concern for underlying Crohn's flare with possible enteritis and bowel obstruction.  The patient is hemodynamically and clinically stable at this time.  He is clinically improving from yesterday into today.  His imaging also is improving.  We will plan to keep him on IV steroids and remain on clear liquid diet today.  If by tomorrow he is tolerating a clear liquid diet by breakfast he can be advanced to full liquid diet.  Within the next 48 hours hopefully can be transition to oral steroids.  Long-term discussion in regards to Skyrizi dosing will be based on insurance approval and primary GI team.  He looks to be improving and hopefully will be able to be discharged in the next 72 hours.   PLAN/RECOMMENDATIONS  Continue NG tube clamp trial Continue clear liquids into 3/10 and if doing well by breakfast can be advanced to full liquid diet by primary service Continue Solu-Medrol 40 mg every 12 hours and by Tuesday if still doing well hopefully go down to once daily and then transition to oral steroids Long-term plan for Skyrizi dosing to be determined as outpatient   Dr. Loreta Ave and Dr. Elnoria Howard take over their service tomorrow morning.  Please page/call with questions or concerns.   Corliss Parish, MD [facility] Office # [phone]    LOS: 4 days  Lemar Lofty  01/01/2024, 2:05 PM

## 2024-01-02 DIAGNOSIS — K50918 Crohn's disease, unspecified, with other complication: Secondary | ICD-10-CM | POA: Diagnosis not present

## 2024-01-02 DIAGNOSIS — K56609 Unspecified intestinal obstruction, unspecified as to partial versus complete obstruction: Secondary | ICD-10-CM | POA: Diagnosis not present

## 2024-01-02 LAB — BASIC METABOLIC PANEL
Anion gap: 8 (ref 5–15)
BUN: 16 mg/dL (ref 6–20)
CO2: 27 mmol/L (ref 22–32)
Calcium: 9.4 mg/dL (ref 8.9–10.3)
Chloride: 100 mmol/L (ref 98–111)
Creatinine, Ser: 0.73 mg/dL (ref 0.61–1.24)
GFR, Estimated: >60 mL/min (ref >60)
Glucose, Bld: 109 mg/dL — ABNORMAL HIGH (ref 70–99)
Potassium: 4.7 mmol/L (ref 3.5–5.1)
Sodium: 135 mmol/L (ref 135–145)

## 2024-01-02 LAB — CBC
HCT: 41.5 % (ref 39.0–52.0)
Hemoglobin: 13.5 g/dL (ref 13.0–17.0)
MCH: 27.1 pg (ref 26.0–34.0)
MCHC: 32.5 g/dL (ref 30.0–36.0)
MCV: 83.3 fL (ref 80.0–100.0)
Platelets: 311 10*3/uL (ref 150–400)
RBC: 4.98 MIL/uL (ref 4.22–5.81)
RDW: 13.6 % (ref 11.5–15.5)
WBC: 9.5 10*3/uL (ref 4.0–10.5)
nRBC: 0 % (ref 0.0–0.2)

## 2024-01-02 NOTE — Progress Notes (Signed)
 PROGRESS NOTE Manuel [name]  NFA:[id number] DOB: [birth year] DOA: 12/27/2023 PCP: No primary care provider on file.   Brief Narrative:  Manuel [name] 23 yrs old male with PMH significant for Crohn's disease on immunosuppression, Asperger's followed by outpatient GI comes to the hospital with complains of abdominal pain and vomiting.  Admitted for Crohn's inflammation possibly causing partial SBO therefore started on steroids.  GI team is following.  Patient reports significant improvement.  Assessment & Plan:   Principal Problem:   SBO (small bowel obstruction) (HCC) Active Problems:   History of Crohn's disease   Enteritis   Generalized abdominal pain   Change in bowel habits  Abdominal pain due to partial small bowel obstruction: History of Crohn's disease on immunosuppression: Enteritis/ Inflamed strictures Currently NG tube is in place and clamped - advancing diet to full liquid.  - GI following, suspect removal of NG tube will be ok today if tolerates diet advancement - continue steroids.    DVT prophylaxis: Heparin sq Code Status:Full code Family Communication: No family at bed side. Disposition Plan:    Status is: Inpatient Remains inpatient appropriate because:  Admitted for SBO, general surgery following, not medically clear yet.   Consultants:  General surgery GI  Procedures: NG Tube  Antimicrobials: Anti-infectives (From admission, onward)    None      Subjective: Patient reports feeling well today. Denies abdominal pain or nausea. Tolerated clears well and wants to advance his diet. He wants the NG tube removed  Objective: Vitals:   01/01/24 0545 01/01/24 1347 01/01/24 2122 01/02/24 0641  BP: 104/75 101/63 132/77 105/74  Pulse: 63 (!) 48 (!) 51 64  Resp: 18 16 18 18   Temp: 97.7 F (36.5 C) 97.6 F (36.4 C) 97.6 F (36.4 C) 98.1 F (36.7 C)  TempSrc: Oral Oral Oral   SpO2: 100% 100% 100% 100%  Weight:      Height:        Intake/Output  Summary (Last 24 hours) at 01/02/2024 0714 Last data filed at 01/02/2024 0641 Gross per 24 hour  Intake 620 ml  Output 0 ml  Net 620 ml   Filed Weights   12/28/23 1426  Weight: 67.8 kg    Examination:  General exam: Appears comfortable, Not in any distress, NG tube noted.  Respiratory system: CTA Bilaterally,  Respiratory effort normal. RR 16 Cardiovascular system: S1 & S2 heard, RRR. No JVD, murmurs, rubs, gallops or clicks. Gastrointestinal system: Abdomen is non distended, soft and non tender.  Central nervous system: Alert and oriented X 3. No focal neurological deficits. Extremities: No edema, no cyanosis, no clubbing. Skin: No rashes, lesions or ulcers Psychiatry: Judgement and insight appear normal. Mood & affect appropriate.   Data Reviewed: I have personally reviewed following labs and imaging studies  CBC: Recent Labs  Lab 12/29/23 0451 12/30/23 0446 12/31/23 0518 01/01/24 0516 01/02/24 0423  WBC 6.2 4.3 6.5 8.0 9.5  HGB 12.0* 12.1* 12.8* 13.4 13.5  HCT 36.9* 38.3* 39.4 43.0 41.5  MCV 84.2 84.2 83.7 85.7 83.3  PLT 261 244 281 291 311   Basic Metabolic Panel: Recent Labs  Lab 12/29/23 0451 12/30/23 0446 12/31/23 0518 01/01/24 0516 01/02/24 0423  NA 139 141 141 137 135  K 4.0 3.8 3.6 4.6 4.7  CL 102 99 99 98 100  CO2 25 31 31 29 27   GLUCOSE 124* 113* 107* 129* 109*  BUN 9 14 18 16 16   CREATININE 0.71 0.71 0.79 0.79 0.73  CALCIUM 9.0 8.8*  9.1 9.3 9.4  PHOS 3.7  --   --   --   --    Radiology Studies: DG Abd 2 Views Result Date: 01/01/2024 CLINICAL DATA:  Crohn disease.  Tube is clamped EXAM: ABDOMEN - 2 VIEW COMPARISON:  X-ray 12/31/2023 FINDINGS: Enteric tube overlying the midbody of the stomach. There is gas seen in nondilated loops of large bowel with scattered stool. There is air in nondilated loops of small bowel as well throughout the mid abdomen. No frank obstruction. No definite free air on these portable views. Air towards the rectum. Minimal  curvature of the spine. IMPRESSION: Enteric tube.  Nonspecific bowel gas pattern.  Scattered stool. Electronically Signed   By: Karen Kays M.D.   On: 01/01/2024 12:31   DG Abd 2 Views Result Date: 12/31/2023 CLINICAL DATA:  23 year old male bowel obstruction.  Crohn disease. EXAM: ABDOMEN - 2 VIEW COMPARISON:  12/30/2023 and earlier. FINDINGS: Portable AP supine view at 0905 hours. Enteric tube remains in the left upper quadrant, side hole the level of the gastric body. Non obstructed bowel gas pattern. No pneumoperitoneum is evident on this supine view. No osseous abnormality identified. IMPRESSION: 1. Stable enteric tube terminating in the stomach. 2.  Non obstructed bowel gas pattern. Electronically Signed   By: Odessa Fleming M.D.   On: 12/31/2023 12:54    Scheduled Meds:  heparin  5,000 Units Subcutaneous Q8H   methylPREDNISolone (SOLU-MEDROL) injection  40 mg Intravenous Q12H   Continuous Infusions:   LOS: 5 days    Time spent: 35 MINS    Leeroy Bock, MD [facility]   If 7PM-7AM, please contact night-coverage

## 2024-01-02 NOTE — Plan of Care (Signed)
   Problem: Education: Goal: Knowledge of General Education information will improve Description: Including pain rating scale, medication(s)/side effects and non-pharmacologic comfort measures Outcome: Progressing   Problem: Activity: Goal: Risk for activity intolerance will decrease Outcome: Progressing

## 2024-01-02 NOTE — Progress Notes (Signed)
 Subjective: Patient seems to be doing well today tolerating a soft diet without any problems.  He wants to get the NG out.  He denies having any nausea or vomiting.  He has rates the abdominal pain as a 4-5 out of 10 in intensity.  He has had a BM this morning.  There is no blood or mucus in the stool.  Objective: Vital signs in last 24 hours: Temp:  [97.6 F (36.4 C)-98.1 F (36.7 C)] 97.8 F (36.6 C) (03/10 1331) Pulse Rate:  [51-64] 64 (03/10 1331) Resp:  [14-18] 14 (03/10 1331) BP: (105-132)/(74-77) 116/77 (03/10 1331) SpO2:  [100 %] 100 % (03/10 1331) Last BM Date : 12/31/23  Intake/Output from previous day: 03/09 0701 - 03/10 0700 In: 620 [P.O.:620] Out: 0  Intake/Output this shift: Total I/O In: 360 [P.O.:360] Out: 0   General appearance: alert, cooperative, appears stated age, fatigued, no distress, and pale Resp: clear to auscultation bilaterally Cardio: regular rate and rhythm, S1, S2 normal, no murmur, click, rub or gallop GI: soft, non-tender; bowel sounds normal; no masses,  no organomegaly  Lab Results: Recent Labs    12/31/23 0518 01/01/24 0516 01/02/24 0423  WBC 6.5 8.0 9.5  HGB 12.8* 13.4 13.5  HCT 39.4 43.0 41.5  PLT 281 291 311   BMET Recent Labs    12/31/23 0518 01/01/24 0516 01/02/24 0423  NA 141 137 135  K 3.6 4.6 4.7  CL 99 98 100  CO2 31 29 27   GLUCOSE 107* 129* 109*  BUN 18 16 16   CREATININE 0.79 0.79 0.73  CALCIUM 9.1 9.3 9.4   LFT No results for input(s): "PROT", "ALBUMIN", "AST", "ALT", "ALKPHOS", "BILITOT", "BILIDIR", "IBILI" in the last 72 hours. PT/INR No results for input(s): "LABPROT", "INR" in the last 72 hours. Hepatitis Panel No results for input(s): "HEPBSAG", "HCVAB", "HEPAIGM", "HEPBIGM" in the last 72 hours. C-Diff No results for input(s): "CDIFFTOX" in the last 72 hours. No results for input(s): "CDIFFPCR" in the last 72 hours. Fecal Lactopherrin No results for input(s): "FECLLACTOFRN" in the last 72  hours.  Studies/Results: DG Abd 2 Views Result Date: 01/01/2024 CLINICAL DATA:  Crohn disease.  Tube is clamped EXAM: ABDOMEN - 2 VIEW COMPARISON:  X-ray 12/31/2023 FINDINGS: Enteric tube overlying the midbody of the stomach. There is gas seen in nondilated loops of large bowel with scattered stool. There is air in nondilated loops of small bowel as well throughout the mid abdomen. No frank obstruction. No definite free air on these portable views. Air towards the rectum. Minimal curvature of the spine. IMPRESSION: Enteric tube.  Nonspecific bowel gas pattern.  Scattered stool. Electronically Signed   By: Karen Kays M.D.   On: 01/01/2024 12:31    Medications: I have reviewed the patient's current medications. Prior to Admission:  Medications Prior to Admission  Medication Sig Dispense Refill Last Dose/Taking   dicyclomine (BENTYL) 20 MG tablet Take 1 tablet by mouth 4 (four) times daily.   12/27/2023   ondansetron (ZOFRAN-ODT) 4 MG disintegrating tablet Take 4 mg by mouth every 8 (eight) hours as needed for vomiting or nausea.   12/27/2023   SKYRIZI 360 MG/2.4ML SOCT Inject 360 mg into the skin See admin instructions. Every 8 weeks   Past Month   Scheduled:  heparin  5,000 Units Subcutaneous Q8H   methylPREDNISolone (SOLU-MEDROL) injection  40 mg Intravenous Q12H   Continuous: ZOX:[id number] **OR** acetaminophen, bisacodyl, HYDROmorphone (DILAUDID) injection, ipratropium-albuterol, metoprolol tartrate, nicotine, ondansetron **OR** ondansetron (ZOFRAN) IV, oxyCODONE, sodium  phosphate  Assessment/Plan: Small bowel obstruction secondary to multiple small bowel strictures/enteritis complicating ileocolonic Crohn's disease-patient has been on Skyrizi every 4 weeks but there is no evidence of improvement with dose escalation and therefore plans are to try him on Infliximab. I will try to order TB QuantiFERON and Hepatitis B surface antigen.  I removed the NG tube the patient has good bowel sounds  and has had a normal bowel movement. Small frequent high-protein meals are recommended.  LOS: 5 days   Charna Elizabeth 01/02/2024, 3:14 PM

## 2024-01-02 NOTE — Plan of Care (Signed)
   Problem: Education: Goal: Knowledge of General Education information will improve Description: Including pain rating scale, medication(s)/side effects and non-pharmacologic comfort measures Outcome: Progressing   Problem: Coping: Goal: Level of anxiety will decrease Outcome: Progressing   Problem: Safety: Goal: Ability to remain free from injury will improve Outcome: Progressing

## 2024-01-02 NOTE — Plan of Care (Signed)
  Problem: Education: Goal: Knowledge of General Education information will improve Description: Including pain rating scale, medication(s)/side effects and non-pharmacologic comfort measures Outcome: Progressing   Problem: Clinical Measurements: Goal: Ability to maintain clinical measurements within normal limits will improve Outcome: Progressing   Problem: Coping: Goal: Level of anxiety will decrease Outcome: Progressing   

## 2024-01-03 ENCOUNTER — Other Ambulatory Visit (HOSPITAL_COMMUNITY): Payer: Self-pay

## 2024-01-03 DIAGNOSIS — K56609 Unspecified intestinal obstruction, unspecified as to partial versus complete obstruction: Secondary | ICD-10-CM | POA: Diagnosis not present

## 2024-01-03 LAB — CBC
HCT: 40.9 % (ref 39.0–52.0)
Hemoglobin: 13 g/dL (ref 13.0–17.0)
MCH: 26.9 pg (ref 26.0–34.0)
MCHC: 31.8 g/dL (ref 30.0–36.0)
MCV: 84.7 fL (ref 80.0–100.0)
Platelets: 299 10*3/uL (ref 150–400)
RBC: 4.83 MIL/uL (ref 4.22–5.81)
RDW: 13.5 % (ref 11.5–15.5)
WBC: 9.3 10*3/uL (ref 4.0–10.5)
nRBC: 0 % (ref 0.0–0.2)

## 2024-01-03 LAB — BASIC METABOLIC PANEL
Anion gap: 7 (ref 5–15)
BUN: 12 mg/dL (ref 6–20)
CO2: 28 mmol/L (ref 22–32)
Calcium: 9.2 mg/dL (ref 8.9–10.3)
Chloride: 100 mmol/L (ref 98–111)
Creatinine, Ser: 0.78 mg/dL (ref 0.61–1.24)
GFR, Estimated: >60 mL/min (ref >60)
Glucose, Bld: 103 mg/dL — ABNORMAL HIGH (ref 70–99)
Potassium: 4.2 mmol/L (ref 3.5–5.1)
Sodium: 135 mmol/L (ref 135–145)

## 2024-01-03 LAB — HEPATITIS B SURFACE ANTIGEN: Hepatitis B Surface Ag: NONREACTIVE

## 2024-01-03 MED ORDER — PREDNISONE 10 MG PO TABS
40.0000 mg | ORAL_TABLET | Freq: Every day | ORAL | 0 refills | Status: DC
  Filled 2024-01-03: qty 120, 30d supply, fill #0

## 2024-01-03 MED ORDER — HYDROMORPHONE HCL 1 MG/ML IJ SOLN: 1.0000 mg | INTRAMUSCULAR | Status: DC | PRN

## 2024-01-03 MED ORDER — OXYCODONE HCL 5 MG PO TABS
5.0000 mg | ORAL_TABLET | ORAL | Status: DC | PRN
  Administered 2024-01-03: 5 mg via ORAL
  Filled 2024-01-03: qty 1

## 2024-01-03 MED ORDER — METHOCARBAMOL 500 MG PO TABS: 500.0000 mg | ORAL_TABLET | Freq: Four times a day (QID) | ORAL | Status: DC | PRN

## 2024-01-03 NOTE — Plan of Care (Signed)

## 2024-01-03 NOTE — Discharge Summary (Signed)
 Physician Discharge Summary  Patient: Manuel [name] ZOX:[id number] DOB: 02-Jan-2001   Code Status: Full Code Admit date: 12/27/2023 Discharge date: 01/03/2024 Disposition: Home, No home health services recommended PCP: No primary care provider on file.  Recommendations for Outpatient Follow-up:  Follow up with PCP within 1-2 weeks Regarding general hospital follow up and preventative care Follow up with GI  Discharge Diagnoses:  Principal Problem:   SBO (small bowel obstruction) (HCC) Active Problems:   History of Crohn's disease   Enteritis   Generalized abdominal pain   Change in bowel habits  Brief Hospital Course Summary: Pt ***  All other chronic conditions were treated with home medications.    Discharge Condition: {DISCHARGE CONDITION:19696}, improved Recommended discharge diet: {Discharge WJXB:[id number]}  Consultations: ***  Procedures/Studies: ***  Discharge Instructions     Discharge patient   Complete by: As directed    Discharge disposition: 01-Home or Self Care   Discharge patient date: 01/03/2024      Allergies as of 01/03/2024   No Known Allergies      Medication List     STOP taking these medications    Skyrizi 360 MG/2.4ML Soct Generic drug: Risankizumab-rzaa       TAKE these medications    dicyclomine 20 MG tablet Commonly known as: BENTYL Take 1 tablet by mouth 4 (four) times daily.   ondansetron 4 MG disintegrating tablet Commonly known as: ZOFRAN-ODT Take 4 mg by mouth every 8 (eight) hours as needed for vomiting or nausea.   predniSONE 10 MG tablet Commonly known as: DELTASONE Take 4 tablets (40 mg total) by mouth daily.        Follow-up Information     Charna Elizabeth, MD. Schedule an appointment as soon as possible for a visit in 1 week(s).   Specialty: Gastroenterology Contact information: [address], [place] [address] [phone]                 Subjective   Pt reports  ***  All questions and concerns were addressed at time of discharge.  Objective  Blood pressure 115/73, pulse 64, temperature 98 F (36.7 C), temperature source Oral, resp. rate 18, height 6\' 3"  (1.905 m), weight 64.8 kg, SpO2 99%.   General: Pt is alert, awake, not in acute distress Cardiovascular: RRR, S1/S2 +, no rubs, no gallops Respiratory: CTA bilaterally, no wheezing, no rhonchi Abdominal: Soft, NT, ND, bowel sounds + Extremities: no edema, no cyanosis  The results of significant diagnostics from this hospitalization (including imaging, microbiology, ancillary and laboratory) are listed below for reference.   Imaging studies: DG Abd 2 Views Result Date: 01/01/2024 CLINICAL DATA:  Crohn disease.  Tube is clamped EXAM: ABDOMEN - 2 VIEW COMPARISON:  X-ray 12/31/2023 FINDINGS: Enteric tube overlying the midbody of the stomach. There is gas seen in nondilated loops of large bowel with scattered stool. There is air in nondilated loops of small bowel as well throughout the mid abdomen. No frank obstruction. No definite free air on these portable views. Air towards the rectum. Minimal curvature of the spine. IMPRESSION: Enteric tube.  Nonspecific bowel gas pattern.  Scattered stool. Electronically Signed   By: Karen Kays M.D.   On: 01/01/2024 12:31   DG Abd 2 Views Result Date: 12/31/2023 CLINICAL DATA:  23 year old male bowel obstruction.  Crohn disease. EXAM: ABDOMEN - 2 VIEW COMPARISON:  12/30/2023 and earlier. FINDINGS: Portable AP supine view at 0905 hours. Enteric tube remains in the left upper quadrant, side  hole the level of the gastric body. Non obstructed bowel gas pattern. No pneumoperitoneum is evident on this supine view. No osseous abnormality identified. IMPRESSION: 1. Stable enteric tube terminating in the stomach. 2.  Non obstructed bowel gas pattern. Electronically Signed   By: Odessa Fleming M.D.   On: 12/31/2023 12:54   DG Abd 1 View Result Date: 12/30/2023 CLINICAL DATA:  Small  bowel obstruction. EXAM: ABDOMEN - 1 VIEW COMPARISON:  December 28, 2023. FINDINGS: The bowel gas pattern is normal. Distal tip of nasogastric tube is seen in stomach. No radio-opaque calculi or other significant radiographic abnormality are seen. IMPRESSION: No abnormal bowel dilatation. Electronically Signed   By: Lupita Raider M.D.   On: 12/30/2023 09:42   DG Abdomen 1 View Result Date: 12/28/2023 CLINICAL DATA:  23 year old male enteric tube placement. EXAM: ABDOMEN - 1 VIEW COMPARISON:  CTA CT Chest, Abdomen, and Pelvis 0325 hours today. FINDINGS: AP view at 0609 hours. Enteric tube placed into the stomach, side hole the level of the gastric fundus. Contrast being excreted into nondilated renal collecting systems and ureters. Contrast partially visible in the urinary bladder. Stable bowel gas pattern. IMPRESSION: Satisfactory enteric tube placement into the stomach. Electronically Signed   By: Odessa Fleming M.D.   On: 12/28/2023 07:09   CT Angio Chest/Abd/Pel for Dissection W and/or W/WO Result Date: 12/28/2023 CLINICAL DATA:  Nausea and vomiting. History of bowel resection. Crohn's disease. Abdominal pain. EXAM: CT ANGIOGRAPHY CHEST, ABDOMEN AND PELVIS TECHNIQUE: Non-contrast CT of the chest was initially obtained. Multidetector CT imaging through the chest, abdomen and pelvis was performed using the standard protocol during bolus administration of intravenous contrast. Multiplanar reconstructed images and MIPs were obtained and reviewed to evaluate the vascular anatomy. RADIATION DOSE REDUCTION: This exam was performed according to the departmental dose-optimization program which includes automated exposure control, adjustment of the mA and/or kV according to patient size and/or use of iterative reconstruction technique. CONTRAST:  OMNIPAQUE IOHEXOL 350 MG/ML SOLN COMPARISON:  Radiographs 12/29/2012 and 12/28/2012 FINDINGS: CTA CHEST FINDINGS Cardiovascular: Normal heart size. No pericardial effusion. No  pulmonary embolism. No acute aortic syndrome. Mediastinum/Nodes: Trachea and esophagus are unremarkable. No thoracic adenopathy. Lungs/Pleura: The lungs are clear. No pleural effusion or pneumothorax. Musculoskeletal: No acute fracture. Review of the MIP images confirms the above findings. CTA ABDOMEN AND PELVIS FINDINGS VASCULAR Aorta: Normal caliber aorta without aneurysm, dissection, vasculitis or significant stenosis. Celiac: Patent without evidence of aneurysm, dissection, vasculitis or significant stenosis. SMA: Patent without evidence of aneurysm, dissection, vasculitis or significant stenosis. Renals: Both renal arteries are patent without evidence of aneurysm, dissection, vasculitis, fibromuscular dysplasia or significant stenosis. IMA: Patent without evidence of aneurysm, dissection, vasculitis or significant stenosis. Inflow: Patent without evidence of aneurysm, dissection, vasculitis or significant stenosis. Veins: The IVC and SMV are not opacified favored due to contrast timing. Grossly patent portal and splenic veins. Review of the MIP images confirms the above findings. NON-VASCULAR Hepatobiliary: No acute abnormality. Pancreas: No acute abnormality. Spleen: Unremarkable. Adrenals/Urinary Tract: Unremarkable adrenal glands and kidneys. No hydronephrosis. Unremarkable bladder. Stomach/Bowel: Postoperative change of partial colectomy with anastomosis in the left upper quadrant. The stomach is within normal limits. Multiple dilated loops of small bowel in the pelvis measuring up to 4.6 cm in diameter. Between the dilated loops of small bowel there are multiple segments of small-bowel wall thickening with mucosal hyperenhancement. Findings are suspicious for partial small bowel obstruction due to inflamed strictures. The appendix is not clearly visualized. Lymphatic: Borderline  enlarged mesenteric lymph nodes are likely reactive. Reproductive: Unremarkable. Other: Small volume free fluid in the pelvis. No  free intraperitoneal air. Musculoskeletal: No acute fracture. Review of the MIP images confirms the above findings. IMPRESSION: 1. Multiple dilated loops of small bowel in the pelvis measuring up to 4.6 cm in diameter. Between the dilated segments of small bowel there are multiple segments of small-bowel wall thickening with mucosal hyperenhancement. Findings are suspicious for partial small bowel obstruction due to enteritis and inflamed strictures. 2. Small volume free fluid in the pelvis. No abscess or free intraperitoneal air. 3. No acute aortic syndrome. No pulmonary embolism. Electronically Signed   By: Minerva Fester M.D.   On: 12/28/2023 03:58    Labs: Basic Metabolic Panel: Recent Labs  Lab 12/29/23 0451 12/30/23 0446 12/31/23 0518 01/01/24 0516 01/02/24 0423 01/03/24 0631  NA 139 141 141 137 135 135  K 4.0 3.8 3.6 4.6 4.7 4.2  CL 102 99 99 98 100 100  CO2 25 31 31 29 27 28   GLUCOSE 124* 113* 107* 129* 109* 103*  BUN 9 14 18 16 16 12   CREATININE 0.71 0.71 0.79 0.79 0.73 0.78  CALCIUM 9.0 8.8* 9.1 9.3 9.4 9.2  PHOS 3.7  --   --   --   --   --    CBC: Recent Labs  Lab 12/30/23 0446 12/31/23 0518 01/01/24 0516 01/02/24 0423 01/03/24 0631  WBC 4.3 6.5 8.0 9.5 9.3  HGB 12.1* 12.8* 13.4 13.5 13.0  HCT 38.3* 39.4 43.0 41.5 40.9  MCV 84.2 83.7 85.7 83.3 84.7  PLT 244 281 291 311 299   Microbiology: Results for orders placed or performed during the hospital encounter of 12/02/11  Rapid strep screen     Status: None   Collection Time: 12/02/11  3:33 PM   Specimen: Oral Mucosa/Gingiva; Throat  Result Value Ref Range Status   Streptococcus, Group A Screen (Direct) NEGATIVE NEGATIVE Final    Comment:        DUE TO INADEQUATE SENSITIVITY OF EIA RAPID TESTS FOR GROUP A STREP (GAS) IT IS RECOMMENDED THAT ALL NEGATIVE RESULTS BE FOLLOWED BY A GROUP A STREP PROBE.    Time coordinating discharge: Over 30 minutes  Leeroy Bock, MD  [facility] 01/03/2024,  4:20 PM

## 2024-01-03 NOTE — Discharge Instructions (Addendum)
 Take 40mg  prednisone daily until your follow up with GI. (That will be 4 tablets which are 10mg  each) GI office number: [phone].

## 2024-01-03 NOTE — Progress Notes (Signed)
 PROGRESS NOTE Manuel [name]  ZOX:[id number] DOB: 03/10/2001 DOA: 12/27/2023 PCP: No primary care provider on file.   Brief Narrative:  Manuel [name] 23 yrs old male with PMH significant for Crohn's disease on immunosuppression, Asperger's followed by outpatient GI comes to the hospital with complains of abdominal pain and vomiting.  Admitted for Crohn's inflammation possibly causing partial SBO therefore started on steroids.  GI team is following.  Patient reports significant improvement. Likely dc tomorrow  Assessment & Plan:   Principal Problem:   SBO (small bowel obstruction) (HCC) Active Problems:   History of Crohn's disease   Enteritis   Generalized abdominal pain   Change in bowel habits  Abdominal pain due to partial small bowel obstruction: History of Crohn's disease on immunosuppression: Enteritis/ Inflamed strictures NG tube removed 3/10 - advancing diet to fsofts - GI following, - continue steroids.   DVT prophylaxis: Heparin sq Code Status:Full code Family Communication: No family at bed side. Disposition Plan:    Status is: Inpatient Remains inpatient appropriate because:  Admitted for SBO, general surgery following, not medically clear yet.   Consultants:  General surgery GI  Procedures: NG Tube  Antimicrobials: Anti-infectives (From admission, onward)    None      Subjective: Patient reports feeling very good today. No nausea or vomiting with full liquid diet. Had mild abdominal discomfort following meals. Positive BM. Wants to advance diet and go home asap.   Objective: Vitals:   01/02/24 1331 01/02/24 2225 01/03/24 0500 01/03/24 0530  BP: 116/77 (!) 136/91  114/71  Pulse: 64 71  (!) 58  Resp: 14 17  18   Temp: 97.8 F (36.6 C) 97.8 F (36.6 C)  (!) 97.5 F (36.4 C)  TempSrc: Oral Oral  Oral  SpO2: 100% 100%  100%  Weight:   64.8 kg   Height:        Intake/Output Summary (Last 24 hours) at 01/03/2024 8119 Last data filed at 01/03/2024  0600 Gross per 24 hour  Intake 1440 ml  Output 0 ml  Net 1440 ml   Filed Weights   12/28/23 1426 01/03/24 0500  Weight: 67.8 kg 64.8 kg    Examination:  General exam: Appears comfortable, Not in any distress, NG tube noted.  Respiratory system: CTA Bilaterally,  Respiratory effort normal. RR 16 Cardiovascular system: S1 & S2 heard, RRR. No JVD, murmurs, rubs, gallops or clicks. Gastrointestinal system: Abdomen is non distended, soft and non tender.  Central nervous system: Alert and oriented X 3. No focal neurological deficits. Extremities: No edema, no cyanosis, no clubbing. Skin: No rashes, lesions or ulcers Psychiatry: Judgement and insight appear normal. Mood & affect appropriate.   Data Reviewed: I have personally reviewed following labs and imaging studies  CBC: Recent Labs  Lab 12/29/23 0451 12/30/23 0446 12/31/23 0518 01/01/24 0516 01/02/24 0423  WBC 6.2 4.3 6.5 8.0 9.5  HGB 12.0* 12.1* 12.8* 13.4 13.5  HCT 36.9* 38.3* 39.4 43.0 41.5  MCV 84.2 84.2 83.7 85.7 83.3  PLT 261 244 281 291 311   Basic Metabolic Panel: Recent Labs  Lab 12/29/23 0451 12/30/23 0446 12/31/23 0518 01/01/24 0516 01/02/24 0423  NA 139 141 141 137 135  K 4.0 3.8 3.6 4.6 4.7  CL 102 99 99 98 100  CO2 25 31 31 29 27   GLUCOSE 124* 113* 107* 129* 109*  BUN 9 14 18 16 16   CREATININE 0.71 0.71 0.79 0.79 0.73  CALCIUM 9.0 8.8* 9.1 9.3 9.4  PHOS 3.7  --   --   --   --  Radiology Studies: DG Abd 2 Views Result Date: 01/01/2024 CLINICAL DATA:  Crohn disease.  Tube is clamped EXAM: ABDOMEN - 2 VIEW COMPARISON:  X-ray 12/31/2023 FINDINGS: Enteric tube overlying the midbody of the stomach. There is gas seen in nondilated loops of large bowel with scattered stool. There is air in nondilated loops of small bowel as well throughout the mid abdomen. No frank obstruction. No definite free air on these portable views. Air towards the rectum. Minimal curvature of the spine. IMPRESSION: Enteric tube.   Nonspecific bowel gas pattern.  Scattered stool. Electronically Signed   By: Karen Kays M.D.   On: 01/01/2024 12:31    Scheduled Meds:  heparin  5,000 Units Subcutaneous Q8H   methylPREDNISolone (SOLU-MEDROL) injection  40 mg Intravenous Q12H   Continuous Infusions:   LOS: 6 days    Time spent: 35 MINS    Leeroy Bock, MD [facility]   If 7PM-7AM, please contact night-coverage

## 2024-01-03 NOTE — Progress Notes (Signed)
 Subjective: No complaints.  His abdominal pain is minimal and he is tolerating a regular diet.  His bowel movements are moving well.  Objective: Vital signs in last 24 hours: Temp:  [97.5 F (36.4 C)-98 F (36.7 C)] 98 F (36.7 C) (03/11 1122) Pulse Rate:  [58-71] 64 (03/11 1122) Resp:  [17-18] 18 (03/11 1122) BP: (114-136)/(71-91) 115/73 (03/11 1122) SpO2:  [99 %-100 %] 99 % (03/11 1122) Weight:  [64.8 kg] 64.8 kg (03/11 0500) Last BM Date : 01/03/24  Intake/Output from previous day: 03/10 0701 - 03/11 0700 In: 1320 [P.O.:1320] Out: 0  Intake/Output this shift: Total I/O In: 600 [P.O.:600] Out: -   General appearance: alert and no distress GI: soft, non-tender; bowel sounds normal; no masses,  no organomegaly  Lab Results: Recent Labs    01/01/24 0516 01/02/24 0423 01/03/24 0631  WBC 8.0 9.5 9.3  HGB 13.4 13.5 13.0  HCT 43.0 41.5 40.9  PLT 291 311 299   BMET Recent Labs    01/01/24 0516 01/02/24 0423 01/03/24 0631  NA 137 135 135  K 4.6 4.7 4.2  CL 98 100 100  CO2 29 27 28   GLUCOSE 129* 109* 103*  BUN 16 16 12   CREATININE 0.79 0.73 0.78  CALCIUM 9.3 9.4 9.2   LFT No results for input(s): "PROT", "ALBUMIN", "AST", "ALT", "ALKPHOS", "BILITOT", "BILIDIR", "IBILI" in the last 72 hours. PT/INR No results for input(s): "LABPROT", "INR" in the last 72 hours. Hepatitis Panel Recent Labs    01/03/24 0631  HEPBSAG NON REACTIVE   C-Diff No results for input(s): "CDIFFTOX" in the last 72 hours. Fecal Lactopherrin No results for input(s): "FECLLACTOFRN" in the last 72 hours.  Studies/Results: No results found.  Medications: Scheduled:  heparin  5,000 Units Subcutaneous Q8H   methylPREDNISolone (SOLU-MEDROL) injection  40 mg Intravenous Q12H   Continuous:  Assessment/Plan: 1) Crohn's flare - resolved.   The patient is well clinically and he can be discharged home.  The patient states that his pain is at baseline.    Plan: 1) Okay to D/C home. 2)  Discharge with oral prednisone 40 mg every day and Dr. Loreta Ave will taper as an outpatient.  LOS: 6 days   Andrey Mccaskill D 01/03/2024, 4:07 PM

## 2024-01-06 LAB — QUANTIFERON-TB GOLD PLUS: QuantiFERON-TB Gold Plus: NEGATIVE

## 2024-01-06 LAB — QUANTIFERON-TB GOLD PLUS (RQFGPL)
QuantiFERON Mitogen Value: 0.93 [IU]/mL
QuantiFERON Nil Value: 0.12 [IU]/mL
QuantiFERON TB1 Ag Value: 0.24 [IU]/mL
QuantiFERON TB2 Ag Value: 0.16 [IU]/mL

## 2024-01-07 ENCOUNTER — Observation Stay (HOSPITAL_COMMUNITY)
Admission: EM | Admit: 2024-01-07 | Discharge: 2024-01-09 | Disposition: A | Discharge status: Home / Self Care | Attending: Internal Medicine | Admitting: Internal Medicine

## 2024-01-07 ENCOUNTER — Encounter (HOSPITAL_COMMUNITY): Payer: Self-pay

## 2024-01-07 ENCOUNTER — Emergency Department (HOSPITAL_COMMUNITY)

## 2024-01-07 ENCOUNTER — Other Ambulatory Visit: Payer: Self-pay

## 2024-01-07 DIAGNOSIS — Z79899 Other long term (current) drug therapy: Secondary | ICD-10-CM | POA: Insufficient documentation

## 2024-01-07 DIAGNOSIS — K50112 Crohn's disease of large intestine with intestinal obstruction: Secondary | ICD-10-CM | POA: Diagnosis not present

## 2024-01-07 DIAGNOSIS — K50818 Crohn's disease of both small and large intestine with other complication: Secondary | ICD-10-CM | POA: Diagnosis present

## 2024-01-07 DIAGNOSIS — F1290 Cannabis use, unspecified, uncomplicated: Secondary | ICD-10-CM | POA: Diagnosis not present

## 2024-01-07 DIAGNOSIS — K5010 Crohn's disease of large intestine without complications: Principal | ICD-10-CM | POA: Diagnosis present

## 2024-01-07 DIAGNOSIS — F845 Asperger's syndrome: Secondary | ICD-10-CM | POA: Diagnosis not present

## 2024-01-07 DIAGNOSIS — K5090 Crohn's disease, unspecified, without complications: Secondary | ICD-10-CM | POA: Diagnosis present

## 2024-01-07 LAB — COMPREHENSIVE METABOLIC PANEL
ALT: 16 U/L (ref 0–44)
AST: 12 U/L — ABNORMAL LOW (ref 15–41)
Albumin: 4 g/dL (ref 3.5–5.0)
Alkaline Phosphatase: 56 U/L (ref 38–126)
Anion gap: 9 (ref 5–15)
BUN: 12 mg/dL (ref 6–20)
CO2: 26 mmol/L (ref 22–32)
Calcium: 9.1 mg/dL (ref 8.9–10.3)
Chloride: 102 mmol/L (ref 98–111)
Creatinine, Ser: 0.79 mg/dL (ref 0.61–1.24)
GFR, Estimated: >60 mL/min (ref >60)
Glucose, Bld: 97 mg/dL (ref 70–99)
Potassium: 4 mmol/L (ref 3.5–5.1)
Sodium: 137 mmol/L (ref 135–145)
Total Bilirubin: 0.9 mg/dL (ref 0.0–1.2)
Total Protein: 7.2 g/dL (ref 6.5–8.1)

## 2024-01-07 LAB — CBC
HCT: 43.8 % (ref 39.0–52.0)
Hemoglobin: 14.5 g/dL (ref 13.0–17.0)
MCH: 27.4 pg (ref 26.0–34.0)
MCHC: 33.1 g/dL (ref 30.0–36.0)
MCV: 82.8 fL (ref 80.0–100.0)
Platelets: 318 10*3/uL (ref 150–400)
RBC: 5.29 MIL/uL (ref 4.22–5.81)
RDW: 14.4 % (ref 11.5–15.5)
WBC: 15.3 10*3/uL — ABNORMAL HIGH (ref 4.0–10.5)
nRBC: 0 % (ref 0.0–0.2)

## 2024-01-07 LAB — URINALYSIS, ROUTINE W REFLEX MICROSCOPIC
Bilirubin Urine: NEGATIVE
Glucose, UA: NEGATIVE mg/dL
Hgb urine dipstick: NEGATIVE
Ketones, ur: 5 mg/dL — AB
Leukocytes,Ua: NEGATIVE
Nitrite: NEGATIVE
Protein, ur: NEGATIVE mg/dL
Specific Gravity, Urine: 1.028 (ref 1.005–1.030)
pH: 5 (ref 5.0–8.0)

## 2024-01-07 LAB — LIPASE, BLOOD: Lipase: 35 U/L (ref 11–51)

## 2024-01-07 MED ORDER — OXYCODONE HCL 5 MG PO TABS
5.0000 mg | ORAL_TABLET | ORAL | Status: DC | PRN
  Administered 2024-01-07 (×2): 5 mg via ORAL
  Filled 2024-01-07 (×2): qty 1

## 2024-01-07 MED ORDER — DROPERIDOL 2.5 MG/ML IJ SOLN
1.2500 mg | Freq: Once | INTRAMUSCULAR | Status: AC
  Administered 2024-01-07: 1.25 mg via INTRAVENOUS
  Filled 2024-01-07: qty 2

## 2024-01-07 MED ORDER — IOHEXOL 300 MG/ML  SOLN
100.0000 mL | Freq: Once | INTRAMUSCULAR | Status: AC | PRN
  Administered 2024-01-07: 100 mL via INTRAVENOUS

## 2024-01-07 MED ORDER — ACETAMINOPHEN 650 MG RE SUPP: 650.0000 mg | Freq: Four times a day (QID) | RECTAL | Status: DC | PRN

## 2024-01-07 MED ORDER — ACETAMINOPHEN 325 MG PO TABS
650.0000 mg | ORAL_TABLET | Freq: Four times a day (QID) | ORAL | Status: DC | PRN
  Administered 2024-01-07 – 2024-01-08 (×2): 650 mg via ORAL
  Filled 2024-01-07 (×2): qty 2

## 2024-01-07 MED ORDER — METHYLPREDNISOLONE SODIUM SUCC 40 MG IJ SOLR
40.0000 mg | Freq: Two times a day (BID) | INTRAMUSCULAR | Status: DC
Start: 2024-01-07 — End: 2024-01-09
  Administered 2024-01-07 – 2024-01-09 (×4): 40 mg via INTRAVENOUS
  Filled 2024-01-07 (×4): qty 1

## 2024-01-07 MED ORDER — OXYCODONE HCL 5 MG PO TABS
10.0000 mg | ORAL_TABLET | ORAL | Status: DC | PRN
  Administered 2024-01-07 – 2024-01-08 (×2): 10 mg via ORAL
  Filled 2024-01-07 (×3): qty 2

## 2024-01-07 MED ORDER — FENTANYL CITRATE PF 50 MCG/ML IJ SOSY
12.5000 ug | PREFILLED_SYRINGE | Freq: Once | INTRAMUSCULAR | Status: AC
  Administered 2024-01-07: 12.5 ug via INTRAVENOUS
  Filled 2024-01-07: qty 1

## 2024-01-07 MED ORDER — PREDNISONE 20 MG PO TABS
40.0000 mg | ORAL_TABLET | Freq: Every day | ORAL | Status: DC
  Administered 2024-01-07: 40 mg via ORAL
  Filled 2024-01-07: qty 2

## 2024-01-07 MED ORDER — TRAZODONE HCL 50 MG PO TABS
50.0000 mg | ORAL_TABLET | Freq: Every evening | ORAL | Status: DC | PRN
  Administered 2024-01-07: 50 mg via ORAL
  Filled 2024-01-07: qty 1

## 2024-01-07 MED ORDER — ENOXAPARIN SODIUM 40 MG/0.4ML IJ SOSY
40.0000 mg | PREFILLED_SYRINGE | INTRAMUSCULAR | Status: DC
  Administered 2024-01-07 – 2024-01-08 (×2): 40 mg via SUBCUTANEOUS
  Filled 2024-01-07 (×3): qty 0.4

## 2024-01-07 MED ORDER — SODIUM CHLORIDE (PF) 0.9 % IJ SOLN
INTRAMUSCULAR | Status: AC
  Filled 2024-01-07: qty 50

## 2024-01-07 MED ORDER — ONDANSETRON HCL 4 MG/2ML IJ SOLN
4.0000 mg | Freq: Once | INTRAMUSCULAR | Status: DC
Start: 2024-01-07 — End: 2024-01-07

## 2024-01-07 MED ORDER — SODIUM CHLORIDE 0.9 % IV BOLUS
500.0000 mL | Freq: Once | INTRAVENOUS | Status: AC
  Administered 2024-01-07: 500 mL via INTRAVENOUS

## 2024-01-07 MED ORDER — ALBUTEROL SULFATE (2.5 MG/3ML) 0.083% IN NEBU: 2.5000 mg | INHALATION_SOLUTION | RESPIRATORY_TRACT | Status: DC | PRN

## 2024-01-07 MED ORDER — FENTANYL CITRATE PF 50 MCG/ML IJ SOSY
50.0000 ug | PREFILLED_SYRINGE | Freq: Once | INTRAMUSCULAR | Status: AC
  Administered 2024-01-07: 50 ug via INTRAVENOUS
  Filled 2024-01-07: qty 1

## 2024-01-07 MED ORDER — ONDANSETRON HCL 4 MG PO TABS
4.0000 mg | ORAL_TABLET | Freq: Four times a day (QID) | ORAL | Status: DC | PRN
  Filled 2024-01-07: qty 1

## 2024-01-07 MED ORDER — SODIUM CHLORIDE 0.9 % IV SOLN: INTRAVENOUS | Status: DC

## 2024-01-07 MED ORDER — ONDANSETRON HCL 4 MG/2ML IJ SOLN: 4.0000 mg | Freq: Four times a day (QID) | INTRAMUSCULAR | Status: DC | PRN

## 2024-01-07 NOTE — ED Provider Notes (Signed)
 [place] EMERGENCY DEPARTMENT AT [facility] Provider Note   CSN: [id number] Arrival date & time: 01/07/24  4098     History  Chief Complaint  Patient presents with   Crohn's Disease    Manuel [name] is a 23 y.o. male.  The history is provided by the patient.  Abdominal Pain Pain location:  Generalized Pain quality: aching   Pain radiates to:  Does not radiate Pain severity:  Severe Onset quality:  Gradual Duration:  2 days Timing:  Constant Progression:  Worsening Context comment:  Marijuana vaping and s/p Crohn's flair for which he was discharged 2 days ago Relieved by:  Nothing Worsened by:  Nothing Ineffective treatments:  OTC medications Associated symptoms: nausea   Associated symptoms: no dysuria and no fever   Risk factors comment:  Crohn's disease with recent admission     Past Medical History:  Diagnosis Date   Allergy    Anxiety    Constipation since about age 56   Depression    Eczema      Home Medications Prior to Admission medications   Medication Sig Start Date End Date Taking? Authorizing Provider  dicyclomine (BENTYL) 20 MG tablet Take 1 tablet by mouth 4 (four) times daily. 08/25/23   [provider]  ondansetron (ZOFRAN-ODT) 4 MG disintegrating tablet Take 4 mg by mouth every 8 (eight) hours as needed for vomiting or nausea. 10/05/23   [provider]  predniSONE (DELTASONE) 10 MG tablet Take 4 tablets (40 mg total) by mouth daily. 01/03/24   Leeroy Bock, MD      Allergies    Patient has no known allergies.    Review of Systems   Review of Systems  Constitutional:  Negative for fever.  Gastrointestinal:  Positive for abdominal pain and nausea.  Genitourinary:  Negative for dysuria.  All other systems reviewed and are negative.   Physical Exam Updated Vital Signs BP (!) 135/91   Pulse 88   Temp 97.8 F (36.6 C) (Oral)   Resp 18   Ht 6\' 3"  (1.905 m)   Wt 64.9 kg   SpO2 98%   BMI 17.87  kg/m  Physical Exam Vitals and nursing note reviewed.  Constitutional:      General: He is not in acute distress.    Appearance: He is well-developed. He is not diaphoretic.  HENT:     Head: Normocephalic and atraumatic.     Nose: Nose normal.  Eyes:     Conjunctiva/sclera: Conjunctivae normal.     Pupils: Pupils are equal, round, and reactive to light.  Cardiovascular:     Rate and Rhythm: Normal rate and regular rhythm.  Pulmonary:     Effort: Pulmonary effort is normal.     Breath sounds: Normal breath sounds. No wheezing or rales.  Abdominal:     General: Bowel sounds are normal.     Palpations: Abdomen is soft.     Tenderness: There is no guarding or rebound.  Musculoskeletal:        General: Normal range of motion.     Cervical back: Normal range of motion and neck supple.  Skin:    General: Skin is warm and dry.  Neurological:     Mental Status: He is alert and oriented to person, place, and time.     ED Results / Procedures / Treatments   Labs (all labs ordered are listed, but only abnormal results are displayed) Results for orders placed or performed during  the hospital encounter of 01/07/24  Lipase, blood   Collection Time: 01/07/24  2:50 AM  Result Value Ref Range   Lipase 35 11 - 51 U/L  Comprehensive metabolic panel   Collection Time: 01/07/24  2:50 AM  Result Value Ref Range   Sodium 137 135 - 145 mmol/L   Potassium 4.0 3.5 - 5.1 mmol/L   Chloride 102 98 - 111 mmol/L   CO2 26 22 - 32 mmol/L   Glucose, Bld 97 70 - 99 mg/dL   BUN 12 6 - 20 mg/dL   Creatinine, Ser 1.61 0.61 - 1.24 mg/dL   Calcium 9.1 8.9 - 09.6 mg/dL   Total Protein 7.2 6.5 - 8.1 g/dL   Albumin 4.0 3.5 - 5.0 g/dL   AST 12 (L) 15 - 41 U/L   ALT 16 0 - 44 U/L   Alkaline Phosphatase 56 38 - 126 U/L   Total Bilirubin 0.9 0.0 - 1.2 mg/dL   GFR, Estimated >04 >54 mL/min   Anion gap 9 5 - 15  CBC   Collection Time: 01/07/24  2:50 AM  Result Value Ref Range   WBC 15.3 (H) 4.0 - 10.5  K/uL   RBC 5.29 4.22 - 5.81 MIL/uL   Hemoglobin 14.5 13.0 - 17.0 g/dL   HCT 09.8 11.9 - 14.7 %   MCV 82.8 80.0 - 100.0 fL   MCH 27.4 26.0 - 34.0 pg   MCHC 33.1 30.0 - 36.0 g/dL   RDW 82.9 56.2 - 13.0 %   Platelets 318 150 - 400 K/uL   nRBC 0.0 0.0 - 0.2 %   DG Abd 2 Views Result Date: 01/01/2024 CLINICAL DATA:  Crohn disease.  Tube is clamped EXAM: ABDOMEN - 2 VIEW COMPARISON:  X-ray 12/31/2023 FINDINGS: Enteric tube overlying the midbody of the stomach. There is gas seen in nondilated loops of large bowel with scattered stool. There is air in nondilated loops of small bowel as well throughout the mid abdomen. No frank obstruction. No definite free air on these portable views. Air towards the rectum. Minimal curvature of the spine. IMPRESSION: Enteric tube.  Nonspecific bowel gas pattern.  Scattered stool. Electronically Signed   By: Karen Kays M.D.   On: 01/01/2024 12:31   DG Abd 2 Views Result Date: 12/31/2023 CLINICAL DATA:  23 year old male bowel obstruction.  Crohn disease. EXAM: ABDOMEN - 2 VIEW COMPARISON:  12/30/2023 and earlier. FINDINGS: Portable AP supine view at 0905 hours. Enteric tube remains in the left upper quadrant, side hole the level of the gastric body. Non obstructed bowel gas pattern. No pneumoperitoneum is evident on this supine view. No osseous abnormality identified. IMPRESSION: 1. Stable enteric tube terminating in the stomach. 2.  Non obstructed bowel gas pattern. Electronically Signed   By: Odessa Fleming M.D.   On: 12/31/2023 12:54   DG Abd 1 View Result Date: 12/30/2023 CLINICAL DATA:  Small bowel obstruction. EXAM: ABDOMEN - 1 VIEW COMPARISON:  December 28, 2023. FINDINGS: The bowel gas pattern is normal. Distal tip of nasogastric tube is seen in stomach. No radio-opaque calculi or other significant radiographic abnormality are seen. IMPRESSION: No abnormal bowel dilatation. Electronically Signed   By: Lupita Raider M.D.   On: 12/30/2023 09:42   DG Abdomen 1 View Result  Date: 12/28/2023 CLINICAL DATA:  23 year old male enteric tube placement. EXAM: ABDOMEN - 1 VIEW COMPARISON:  CTA CT Chest, Abdomen, and Pelvis 0325 hours today. FINDINGS: AP view at 0609 hours. Enteric tube placed into  the stomach, side hole the level of the gastric fundus. Contrast being excreted into nondilated renal collecting systems and ureters. Contrast partially visible in the urinary bladder. Stable bowel gas pattern. IMPRESSION: Satisfactory enteric tube placement into the stomach. Electronically Signed   By: Odessa Fleming M.D.   On: 12/28/2023 07:09   CT Angio Chest/Abd/Pel for Dissection W and/or W/WO Result Date: 12/28/2023 CLINICAL DATA:  Nausea and vomiting. History of bowel resection. Crohn's disease. Abdominal pain. EXAM: CT ANGIOGRAPHY CHEST, ABDOMEN AND PELVIS TECHNIQUE: Non-contrast CT of the chest was initially obtained. Multidetector CT imaging through the chest, abdomen and pelvis was performed using the standard protocol during bolus administration of intravenous contrast. Multiplanar reconstructed images and MIPs were obtained and reviewed to evaluate the vascular anatomy. RADIATION DOSE REDUCTION: This exam was performed according to the departmental dose-optimization program which includes automated exposure control, adjustment of the mA and/or kV according to patient size and/or use of iterative reconstruction technique. CONTRAST:  OMNIPAQUE IOHEXOL 350 MG/ML SOLN COMPARISON:  Radiographs 12/29/2012 and 12/28/2012 FINDINGS: CTA CHEST FINDINGS Cardiovascular: Normal heart size. No pericardial effusion. No pulmonary embolism. No acute aortic syndrome. Mediastinum/Nodes: Trachea and esophagus are unremarkable. No thoracic adenopathy. Lungs/Pleura: The lungs are clear. No pleural effusion or pneumothorax. Musculoskeletal: No acute fracture. Review of the MIP images confirms the above findings. CTA ABDOMEN AND PELVIS FINDINGS VASCULAR Aorta: Normal caliber aorta without aneurysm, dissection,  vasculitis or significant stenosis. Celiac: Patent without evidence of aneurysm, dissection, vasculitis or significant stenosis. SMA: Patent without evidence of aneurysm, dissection, vasculitis or significant stenosis. Renals: Both renal arteries are patent without evidence of aneurysm, dissection, vasculitis, fibromuscular dysplasia or significant stenosis. IMA: Patent without evidence of aneurysm, dissection, vasculitis or significant stenosis. Inflow: Patent without evidence of aneurysm, dissection, vasculitis or significant stenosis. Veins: The IVC and SMV are not opacified favored due to contrast timing. Grossly patent portal and splenic veins. Review of the MIP images confirms the above findings. NON-VASCULAR Hepatobiliary: No acute abnormality. Pancreas: No acute abnormality. Spleen: Unremarkable. Adrenals/Urinary Tract: Unremarkable adrenal glands and kidneys. No hydronephrosis. Unremarkable bladder. Stomach/Bowel: Postoperative change of partial colectomy with anastomosis in the left upper quadrant. The stomach is within normal limits. Multiple dilated loops of small bowel in the pelvis measuring up to 4.6 cm in diameter. Between the dilated loops of small bowel there are multiple segments of small-bowel wall thickening with mucosal hyperenhancement. Findings are suspicious for partial small bowel obstruction due to inflamed strictures. The appendix is not clearly visualized. Lymphatic: Borderline enlarged mesenteric lymph nodes are likely reactive. Reproductive: Unremarkable. Other: Small volume free fluid in the pelvis. No free intraperitoneal air. Musculoskeletal: No acute fracture. Review of the MIP images confirms the above findings. IMPRESSION: 1. Multiple dilated loops of small bowel in the pelvis measuring up to 4.6 cm in diameter. Between the dilated segments of small bowel there are multiple segments of small-bowel wall thickening with mucosal hyperenhancement. Findings are suspicious for partial  small bowel obstruction due to enteritis and inflamed strictures. 2. Small volume free fluid in the pelvis. No abscess or free intraperitoneal air. 3. No acute aortic syndrome. No pulmonary embolism. Electronically Signed   By: Minerva Fester M.D.   On: 12/28/2023 03:58     Radiology No results found.  Procedures Procedures    Medications Ordered in ED Results for orders placed or performed during the hospital encounter of 01/07/24  Lipase, blood   Collection Time: 01/07/24  2:50 AM  Result Value Ref Range  Lipase 35 11 - 51 U/L  Comprehensive metabolic panel   Collection Time: 01/07/24  2:50 AM  Result Value Ref Range   Sodium 137 135 - 145 mmol/L   Potassium 4.0 3.5 - 5.1 mmol/L   Chloride 102 98 - 111 mmol/L   CO2 26 22 - 32 mmol/L   Glucose, Bld 97 70 - 99 mg/dL   BUN 12 6 - 20 mg/dL   Creatinine, Ser 8.11 0.61 - 1.24 mg/dL   Calcium 9.1 8.9 - 91.4 mg/dL   Total Protein 7.2 6.5 - 8.1 g/dL   Albumin 4.0 3.5 - 5.0 g/dL   AST 12 (L) 15 - 41 U/L   ALT 16 0 - 44 U/L   Alkaline Phosphatase 56 38 - 126 U/L   Total Bilirubin 0.9 0.0 - 1.2 mg/dL   GFR, Estimated >78 >29 mL/min   Anion gap 9 5 - 15  CBC   Collection Time: 01/07/24  2:50 AM  Result Value Ref Range   WBC 15.3 (H) 4.0 - 10.5 K/uL   RBC 5.29 4.22 - 5.81 MIL/uL   Hemoglobin 14.5 13.0 - 17.0 g/dL   HCT 56.2 13.0 - 86.5 %   MCV 82.8 80.0 - 100.0 fL   MCH 27.4 26.0 - 34.0 pg   MCHC 33.1 30.0 - 36.0 g/dL   RDW 78.4 69.6 - 29.5 %   Platelets 318 150 - 400 K/uL   nRBC 0.0 0.0 - 0.2 %   DG Abd 2 Views Result Date: 01/01/2024 CLINICAL DATA:  Crohn disease.  Tube is clamped EXAM: ABDOMEN - 2 VIEW COMPARISON:  X-ray 12/31/2023 FINDINGS: Enteric tube overlying the midbody of the stomach. There is gas seen in nondilated loops of large bowel with scattered stool. There is air in nondilated loops of small bowel as well throughout the mid abdomen. No frank obstruction. No definite free air on these portable views. Air  towards the rectum. Minimal curvature of the spine. IMPRESSION: Enteric tube.  Nonspecific bowel gas pattern.  Scattered stool. Electronically Signed   By: Karen Kays M.D.   On: 01/01/2024 12:31   DG Abd 2 Views Result Date: 12/31/2023 CLINICAL DATA:  23 year old male bowel obstruction.  Crohn disease. EXAM: ABDOMEN - 2 VIEW COMPARISON:  12/30/2023 and earlier. FINDINGS: Portable AP supine view at 0905 hours. Enteric tube remains in the left upper quadrant, side hole the level of the gastric body. Non obstructed bowel gas pattern. No pneumoperitoneum is evident on this supine view. No osseous abnormality identified. IMPRESSION: 1. Stable enteric tube terminating in the stomach. 2.  Non obstructed bowel gas pattern. Electronically Signed   By: Odessa Fleming M.D.   On: 12/31/2023 12:54   DG Abd 1 View Result Date: 12/30/2023 CLINICAL DATA:  Small bowel obstruction. EXAM: ABDOMEN - 1 VIEW COMPARISON:  December 28, 2023. FINDINGS: The bowel gas pattern is normal. Distal tip of nasogastric tube is seen in stomach. No radio-opaque calculi or other significant radiographic abnormality are seen. IMPRESSION: No abnormal bowel dilatation. Electronically Signed   By: Lupita Raider M.D.   On: 12/30/2023 09:42   DG Abdomen 1 View Result Date: 12/28/2023 CLINICAL DATA:  23 year old male enteric tube placement. EXAM: ABDOMEN - 1 VIEW COMPARISON:  CTA CT Chest, Abdomen, and Pelvis 0325 hours today. FINDINGS: AP view at 0609 hours. Enteric tube placed into the stomach, side hole the level of the gastric fundus. Contrast being excreted into nondilated renal collecting systems and ureters. Contrast partially visible in  the urinary bladder. Stable bowel gas pattern. IMPRESSION: Satisfactory enteric tube placement into the stomach. Electronically Signed   By: Odessa Fleming M.D.   On: 12/28/2023 07:09   CT Angio Chest/Abd/Pel for Dissection W and/or W/WO Result Date: 12/28/2023 CLINICAL DATA:  Nausea and vomiting. History of bowel resection.  Crohn's disease. Abdominal pain. EXAM: CT ANGIOGRAPHY CHEST, ABDOMEN AND PELVIS TECHNIQUE: Non-contrast CT of the chest was initially obtained. Multidetector CT imaging through the chest, abdomen and pelvis was performed using the standard protocol during bolus administration of intravenous contrast. Multiplanar reconstructed images and MIPs were obtained and reviewed to evaluate the vascular anatomy. RADIATION DOSE REDUCTION: This exam was performed according to the departmental dose-optimization program which includes automated exposure control, adjustment of the mA and/or kV according to patient size and/or use of iterative reconstruction technique. CONTRAST:  OMNIPAQUE IOHEXOL 350 MG/ML SOLN COMPARISON:  Radiographs 12/29/2012 and 12/28/2012 FINDINGS: CTA CHEST FINDINGS Cardiovascular: Normal heart size. No pericardial effusion. No pulmonary embolism. No acute aortic syndrome. Mediastinum/Nodes: Trachea and esophagus are unremarkable. No thoracic adenopathy. Lungs/Pleura: The lungs are clear. No pleural effusion or pneumothorax. Musculoskeletal: No acute fracture. Review of the MIP images confirms the above findings. CTA ABDOMEN AND PELVIS FINDINGS VASCULAR Aorta: Normal caliber aorta without aneurysm, dissection, vasculitis or significant stenosis. Celiac: Patent without evidence of aneurysm, dissection, vasculitis or significant stenosis. SMA: Patent without evidence of aneurysm, dissection, vasculitis or significant stenosis. Renals: Both renal arteries are patent without evidence of aneurysm, dissection, vasculitis, fibromuscular dysplasia or significant stenosis. IMA: Patent without evidence of aneurysm, dissection, vasculitis or significant stenosis. Inflow: Patent without evidence of aneurysm, dissection, vasculitis or significant stenosis. Veins: The IVC and SMV are not opacified favored due to contrast timing. Grossly patent portal and splenic veins. Review of the MIP images confirms the above  findings. NON-VASCULAR Hepatobiliary: No acute abnormality. Pancreas: No acute abnormality. Spleen: Unremarkable. Adrenals/Urinary Tract: Unremarkable adrenal glands and kidneys. No hydronephrosis. Unremarkable bladder. Stomach/Bowel: Postoperative change of partial colectomy with anastomosis in the left upper quadrant. The stomach is within normal limits. Multiple dilated loops of small bowel in the pelvis measuring up to 4.6 cm in diameter. Between the dilated loops of small bowel there are multiple segments of small-bowel wall thickening with mucosal hyperenhancement. Findings are suspicious for partial small bowel obstruction due to inflamed strictures. The appendix is not clearly visualized. Lymphatic: Borderline enlarged mesenteric lymph nodes are likely reactive. Reproductive: Unremarkable. Other: Small volume free fluid in the pelvis. No free intraperitoneal air. Musculoskeletal: No acute fracture. Review of the MIP images confirms the above findings. IMPRESSION: 1. Multiple dilated loops of small bowel in the pelvis measuring up to 4.6 cm in diameter. Between the dilated segments of small bowel there are multiple segments of small-bowel wall thickening with mucosal hyperenhancement. Findings are suspicious for partial small bowel obstruction due to enteritis and inflamed strictures. 2. Small volume free fluid in the pelvis. No abscess or free intraperitoneal air. 3. No acute aortic syndrome. No pulmonary embolism. Electronically Signed   By: Minerva Fester M.D.   On: 12/28/2023 03:58     ED Course/ Medical Decision Making/ A&P                                 Medical Decision Making Paitent with Crohn's disease recently discharged with steroids who smoked marijuana and is concerned his has CHS and recurrent SBO  Amount and/or Complexity of Data Reviewed  External Data Reviewed: labs, radiology and notes.    Details: Previous admission reviewed  Labs: ordered.    Details: Normal lipase 35,  normal sodium 137, normal potassium 4, normal creatinine 0.79.  Normal LFTs.  White count elevated 15.3, normal hemoglobin 14.5  Radiology: ordered.  Risk Prescription drug management. Parenteral controlled substances. Decision regarding hospitalization.   Medications  0.9 %  sodium chloride infusion (has no administration in time range)  fentaNYL (SUBLIMAZE) injection 50 mcg (50 mcg Intravenous Given 01/07/24 0555)  droperidol (INAPSINE) 2.5 MG/ML injection 1.25 mg (1.25 mg Intravenous Given 01/07/24 0532)  sodium chloride 0.9 % bolus 500 mL (500 mLs Intravenous New Bag/Given 01/07/24 0533)  iohexol (OMNIPAQUE) 300 MG/ML solution 100 mL (100 mLs Intravenous Contrast Given 01/07/24 0647)    Final Clinical Impression(s) / ED Diagnoses Final diagnoses:  None   Signed out to Dr. Rhunette Croft pending CT, will need admission  Rx / DC Orders ED Discharge Orders     None         Trinnity Breunig, MD 01/07/24 1610

## 2024-01-07 NOTE — H&P (Signed)
 History and Physical  Manuel [name]:[id number] DOB: 26-Mar-2001 DOA: 01/07/2024  PCP: Pcp, No   Chief Complaint: Abdominal pain, vomiting  HPI: Manuel [name] is a 23 y.o. male with medical history significant for Asperger's, Crohn's disease with recent admission for partial SBO discharged home on 01/03/2024 admitted with concern for recurrent Crohn's flare.  States he had recurrence of pain and mild nausea after discharge from the hospital.  He was discharged on oral prednisone as it was felt that his obstruction likely caused by Crohn's inflammation.  He had lab work done in preparation for starting immune modulators.  Dates that after going home, he started to have more severe abdominal pain, a friend suggested that he try smoking some marijuana.  States this did not help his pain, instead made him nauseous and feel very paranoid.  He therefore came to the emergency department early this morning, workup reveals leukocytosis, CT scan as detailed below with evidence of ongoing enteritis and improvement in colon distention.  ER provider has requested [facility] consultation, and hospitalist observation admission.  Review of Systems: Please see HPI for pertinent positives and negatives. A complete 10 system review of systems are otherwise negative.  Past Medical History:  Diagnosis Date   Allergy    Anxiety    Constipation since about age 74   Depression    Eczema    Past Surgical History:  Procedure Laterality Date   BOWEL RESECTION  07/2015   COLONOSCOPY N/A 12/29/2012   Procedure: COLONOSCOPY;  Surgeon: Jon Gills, MD;  Location: [facility] OR;  Service: Gastroenterology;  Laterality: N/A;   COLONOSCOPY N/A 12/29/2012   Procedure: COLONOSCOPY;  Surgeon: Jon Gills, MD;  Location: [facility] OR;  Service: Gastroenterology;  Laterality: N/A;   ESOPHAGOGASTRODUODENOSCOPY N/A 12/29/2012   Procedure: ESOPHAGOGASTRODUODENOSCOPY (EGD);  Surgeon: Jon Gills, MD;  Location: [facility] OR;  Service:  Gastroenterology;  Laterality: N/A;   ESOPHAGOGASTRODUODENOSCOPY Bilateral 12/29/2012   Procedure: ESOPHAGOGASTRODUODENOSCOPY (EGD);  Surgeon: Jon Gills, MD;  Location: [facility] OR;  Service: Gastroenterology;  Laterality: Bilateral;   Social History:  reports that he has never smoked. He has never used smokeless tobacco. He reports current alcohol use of about 8.0 standard drinks of alcohol per week. He reports current drug use. Drug: Marijuana.  No Known Allergies  Family History  Problem Relation Age of Onset   Depression Father    Birth defects Maternal Grandmother    Alcohol abuse Maternal Grandfather    Birth defects Maternal Grandfather      Prior to Admission medications   Medication Sig Start Date End Date Taking? Authorizing Provider  dicyclomine (BENTYL) 20 MG tablet Take 1 tablet by mouth 4 (four) times daily. 08/25/23   [provider]  ondansetron (ZOFRAN-ODT) 4 MG disintegrating tablet Take 4 mg by mouth every 8 (eight) hours as needed for vomiting or nausea. 10/05/23   [provider]  predniSONE (DELTASONE) 10 MG tablet Take 4 tablets (40 mg total) by mouth daily. 01/03/24   Leeroy Bock, MD    Physical Exam: BP (!) 104/45 (BP Location: Right Arm)   Pulse 60   Temp 97.9 F (36.6 C)   Resp 16   Ht 6\' 3"  (1.905 m)   Wt 64.9 kg   SpO2 100%   BMI 17.87 kg/m  General:  Alert, oriented, calm, in no acute distress, looks very comfortable Cardiovascular: RRR, no murmurs or rubs, no peripheral edema  Respiratory: clear to auscultation bilaterally, no wheezes, no  crackles  Abdomen: soft, nontender, nondistended, normal bowel tones heard  Skin: dry, no rashes  Musculoskeletal: no joint effusions, normal range of motion  Psychiatric: appropriate affect, normal speech  Neurologic: extraocular muscles intact, clear speech, moving all extremities with intact sensorium         Labs on Admission:  Basic Metabolic Panel: Recent Labs  Lab  01/01/24 0516 01/02/24 0423 01/03/24 0631 01/07/24 0250  NA 137 135 135 137  K 4.6 4.7 4.2 4.0  CL 98 100 100 102  CO2 29 27 28 26   GLUCOSE 129* 109* 103* 97  BUN 16 16 12 12   CREATININE 0.79 0.73 0.78 0.79  CALCIUM 9.3 9.4 9.2 9.1   Liver Function Tests: Recent Labs  Lab 01/07/24 0250  AST 12*  ALT 16  ALKPHOS 56  BILITOT 0.9  PROT 7.2  ALBUMIN 4.0   Recent Labs  Lab 01/07/24 0250  LIPASE 35   No results for input(s): "AMMONIA" in the last 168 hours. CBC: Recent Labs  Lab 01/01/24 0516 01/02/24 0423 01/03/24 0631 01/07/24 0250  WBC 8.0 9.5 9.3 15.3*  HGB 13.4 13.5 13.0 14.5  HCT 43.0 41.5 40.9 43.8  MCV 85.7 83.3 84.7 82.8  PLT 291 311 299 318   Cardiac Enzymes: No results for input(s): "CKTOTAL", "CKMB", "CKMBINDEX", "TROPONINI" in the last 168 hours. BNP (last 3 results) No results for input(s): "BNP" in the last 8760 hours.  ProBNP (last 3 results) No results for input(s): "PROBNP" in the last 8760 hours.  CBG: No results for input(s): "GLUCAP" in the last 168 hours.  Radiological Exams on Admission: CT ABDOMEN PELVIS W CONTRAST Result Date: 01/07/2024 CLINICAL DATA:  Blunt poly trauma. EXAM: CT ABDOMEN AND PELVIS WITH CONTRAST TECHNIQUE: Multidetector CT imaging of the abdomen and pelvis was performed using the standard protocol following bolus administration of intravenous contrast. RADIATION DOSE REDUCTION: This exam was performed according to the departmental dose-optimization program which includes automated exposure control, adjustment of the mA and/or kV according to patient size and/or use of iterative reconstruction technique. CONTRAST:  OMNIPAQUE IOHEXOL 300 MG/ML  SOLN COMPARISON:  12/28/2023 FINDINGS: Lower chest:  No contributory findings. Hepatobiliary: No focal liver abnormality.No evidence of biliary obstruction or stone. Pancreas: Unremarkable. Spleen: Unremarkable. Adrenals/Urinary Tract: Negative adrenals. No hydronephrosis or  stone. Unremarkable bladder. Stomach/Bowel: History of Crohn's with thicken mucosal hyperenhancing loops in the pelvis which show tethering and kinking from penetrating disease with sinus tract features. Affected loops measure up to 3.5 cm in diameter attributed to chronic partial obstructive changes, possibly from associated stricture, improved from prior. Postoperative left colon with anastomosis. Moderate stool in the colon. No perforation or abscess seen. Vascular/Lymphatic: No acute vascular abnormality. Enlarged mesenteric lymph nodes, considered reactive. Reproductive:No pathologic findings. Other: No ascites or pneumoperitoneum. Musculoskeletal: No acute abnormalities. 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 Affected loops are relatively distended and there could be fibrostenosing disease, bowel distension is improved from scan 10 days prior. Electronically Signed   By: Tiburcio Pea M.D.   On: 01/07/2024 07:53   Assessment/Plan Manuel [name] is a 23 y.o. male with medical history significant for Asperger's, Crohn's disease with recent admission for partial SBO discharged home on 01/03/2024 admitted with concern for recurrent Crohn's flare.  Crohn's with active enteritis-patient with recurrent abdominal pain and vomiting.  No fevers. -Observation admission -Pain and nausea control -Clear liquid diet -Continue oral prednisone, await further recommendations from GI  Leukocytosis-likely due to  prednisone use, though may be related to active Crohn's flare versus infection which is deemed less likely.  DVT prophylaxis: Lovenox     Code Status: Full Code  Consults called: [facility]  Admission status: Observation  Time spent: 48 minutes  Courtney Fenlon Sharlette Dense MD [facility] Pager [phone]  If 7PM-7AM, please contact night-coverage [email or web address] Password [facility]  01/07/2024, 9:16 AM

## 2024-01-07 NOTE — ED Triage Notes (Addendum)
 Pt presents with a Crohn's flare consisting of mid abdominal pain that radiates to lower back with n/v/d. Recently admitted x1wk with SBO and d/c x2days ago. Denies any fever or chills.

## 2024-01-07 NOTE — Consult Note (Addendum)
 Consultation  Referring Provider:      Primary Care Physician:  Pcp, No Primary Gastroenterologist:         Reason for Consultation:              HPI:   Manuel [name] is a 23 y.o. male  Patient well-known to Dr. 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  Failed Rinvoq.  Has recently been on Skyrizi x for last 7 to 8 months.  Recent adm 3/5-3/11 with PSBO req NG tube, IV steroids to PO prednisone 40 daily.  Managed conservatively.  Comes into the ED with 3-day history of nausea/vomiting/abdominal pain.  He has been having diarrhea at the frequency of 2-3 bowel movements per day without any melena or hematochezia.  Has has been using marijuana.  No fever or chills.  Had a CT Abdo/pelvis in the ED which is consistent with Crohn's, no definite small bowel obstruction.  No abscesses.  Admitted for pain control.    He has been taking prednisone but has been throwing up as well.  I do not have access to Dr. Kenna Gilbert office notes. Not sure when the last colonoscopy was performed Per patient it was "sometime ago".  Past Medical History:  Diagnosis Date   Allergy    Anxiety    Constipation since about age 30   Depression    Eczema     Past Surgical History:  Procedure Laterality Date   BOWEL RESECTION  07/2015   COLONOSCOPY N/A 12/29/2012   Procedure: COLONOSCOPY;  Surgeon: Jon Gills, MD;  Location: [facility] OR;  Service: Gastroenterology;  Laterality: N/A;   COLONOSCOPY N/A 12/29/2012   Procedure: COLONOSCOPY;  Surgeon: Jon Gills, MD;  Location: [facility] OR;  Service: Gastroenterology;  Laterality: N/A;   ESOPHAGOGASTRODUODENOSCOPY N/A 12/29/2012   Procedure: ESOPHAGOGASTRODUODENOSCOPY (EGD);  Surgeon: Jon Gills, MD;  Location: [facility] OR;  Service: Gastroenterology;  Laterality: N/A;    ESOPHAGOGASTRODUODENOSCOPY Bilateral 12/29/2012   Procedure: ESOPHAGOGASTRODUODENOSCOPY (EGD);  Surgeon: Jon Gills, MD;  Location: [facility] OR;  Service: Gastroenterology;  Laterality: Bilateral;    Family History  Problem Relation Age of Onset   Depression Father    Birth defects Maternal Grandmother    Alcohol abuse Maternal Grandfather    Birth defects Maternal Grandfather      Social History   Tobacco Use   Smoking status: Never   Smokeless tobacco: Never  Vaping Use   Vaping status: Former  Substance Use Topics   Alcohol use: Yes    Alcohol/week: 8.0 standard drinks of alcohol    Types: 8 Cans of beer per week   Drug use: Yes    Types: Marijuana    Prior to Admission medications   Medication Sig Start Date End Date Taking? Authorizing Provider  dicyclomine (BENTYL) 20 MG tablet Take 1 tablet by mouth 4 (four) times daily. 08/25/23   [provider]  ondansetron (ZOFRAN-ODT) 4 MG disintegrating tablet Take 4 mg by mouth every 8 (eight) hours as needed for vomiting or nausea. 10/05/23   [provider]  predniSONE (DELTASONE) 10 MG tablet Take 4 tablets (40 mg total) by mouth daily. 01/03/24   Leeroy Bock, MD    Current Facility-Administered Medications  Medication Dose Route Frequency Provider Last Rate Last Admin   0.9 %  sodium chloride  infusion   Intravenous Continuous Palumbo, April, MD 125 mL/hr at 01/07/24 0728 New Bag at 01/07/24 9147   acetaminophen (TYLENOL) tablet 650 mg  650 mg Oral Q6H PRN Kirby Crigler, Mir M, MD       Or   acetaminophen (TYLENOL) suppository 650 mg  650 mg Rectal Q6H PRN Kirby Crigler, Mir M, MD       albuterol (PROVENTIL) (2.5 MG/3ML) 0.083% nebulizer solution 2.5 mg  2.5 mg Nebulization Q2H PRN Kirby Crigler, Mir M, MD       enoxaparin (LOVENOX) injection 40 mg  40 mg Subcutaneous Q24H Kirby Crigler, Mir M, MD   40 mg at 01/07/24 0926   ondansetron (ZOFRAN) tablet 4 mg  4 mg Oral Q6H PRN Kirby Crigler, Mir M, MD       Or    ondansetron [facility]) injection 4 mg  4 mg Intravenous Q6H PRN Kirby Crigler, Mir M, MD       oxyCODONE (Oxy IR/ROXICODONE) immediate release tablet 5 mg  5 mg Oral Q4H PRN Kirby Crigler, Mir M, MD       predniSONE (DELTASONE) tablet 40 mg  40 mg Oral Daily Kirby Crigler, Mir M, MD   40 mg at 01/07/24 8295   traZODone (DESYREL) tablet 50 mg  50 mg Oral QHS PRN Maryln Gottron, MD        Allergies as of 01/07/2024   (No Known Allergies)     Review of Systems:    As per HPI, otherwise negative    Physical Exam:  Vital signs in last 24 hours: Temp:  [97.8 F (36.6 C)-98 F (36.7 C)] 98 F (36.7 C) (03/15 0915) Pulse Rate:  [60-95] 95 (03/15 0915) Resp:  [11-18] 11 (03/15 0915) BP: (104-135)/(45-91) 134/78 (03/15 0915) SpO2:  [98 %-100 %] 100 % (03/15 0915) Weight:  [64.9 kg] 64.9 kg (03/15 0239)   Gen: awake, alert, NAD HEENT: anicteric, no pallor CV: RRR, no mrg Pulm: CTA b/l Abd: soft, NT/ND, +BS throughout Ext: no c/c/e Neuro: nonfocal   LAB RESULTS: Recent Labs    01/07/24 0250  WBC 15.3*  HGB 14.5  HCT 43.8  PLT 318   BMET Recent Labs    01/07/24 0250  NA 137  K 4.0  CL 102  CO2 26  GLUCOSE 97  BUN 12  CREATININE 0.79  CALCIUM 9.1   LFT Recent Labs    01/07/24 0250  PROT 7.2  ALBUMIN 4.0  AST 12*  ALT 16  ALKPHOS 56  BILITOT 0.9   PT/INR No results for input(s): "LABPROT", "INR" in the last 72 hours.  STUDIES: CT ABDOMEN PELVIS W CONTRAST Result Date: 01/07/2024 CLINICAL DATA:  Blunt poly trauma. EXAM: CT ABDOMEN AND PELVIS WITH CONTRAST TECHNIQUE: Multidetector CT imaging of the abdomen and pelvis was performed using the standard protocol following bolus administration of intravenous contrast. RADIATION DOSE REDUCTION: This exam was performed according to the departmental dose-optimization program which includes automated exposure control, adjustment of the mA and/or kV according to patient size and/or use of iterative reconstruction technique.  CONTRAST:  OMNIPAQUE IOHEXOL 300 MG/ML  SOLN COMPARISON:  12/28/2023 FINDINGS: Lower chest:  No contributory findings. Hepatobiliary: No focal liver abnormality.No evidence of biliary obstruction or stone. Pancreas: Unremarkable. Spleen: Unremarkable. Adrenals/Urinary Tract: Negative adrenals. No hydronephrosis or stone. Unremarkable bladder. Stomach/Bowel: History of Crohn's with thicken mucosal hyperenhancing loops in the pelvis which show tethering and kinking from penetrating disease with sinus tract features. Affected loops measure up to 3.5 cm in diameter attributed to chronic partial  obstructive changes, possibly from associated stricture, improved from prior. Postoperative left colon with anastomosis. Moderate stool in the colon. No perforation or abscess seen. Vascular/Lymphatic: No acute vascular abnormality. Enlarged mesenteric lymph nodes, considered reactive. Reproductive:No pathologic findings. Other: No ascites or pneumoperitoneum. Musculoskeletal: No acute abnormalities. 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 Affected loops are relatively distended and there could be fibrostenosing disease, bowel distension is improved from scan 10 days prior. Electronically Signed   By: Tiburcio Pea M.D.   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  Failed 6-MP, Humira, Rinvoq and most recently [employer]. Neg TB Gold and hepatitis B surface antigen Marijuana use/abuse Asperger's syndrome   Plan: -IV Solu-Medrol 40 every 12 hours. -Hold off on NG tube -Pain control per hospitalist SVC.  Pain seems to be out of proportion to objective findings.  Still will give him benefit of doubt for now. -Long-term treatment plan as per Dr. Loreta Ave.  -Dr. Marina Goodell taking over service tomorrow.  Dr. Nicholes Mango to resume care on  Monday.    Edman Circle, MD [facility] [phone]    LOS: 0 days   Lynann Bologna  01/07/2024, 10:32 AM

## 2024-01-07 NOTE — ED Provider Notes (Signed)
  Physical Exam  BP (!) 135/91   Pulse 88   Temp 97.9 F (36.6 C)   Resp 18   Ht 6\' 3"  (1.905 m)   Wt 64.9 kg   SpO2 98%   BMI 17.87 kg/m   Physical Exam  Procedures  Procedures  ED Course / MDM    Medical Decision Making Amount and/or Complexity of Data Reviewed Labs: ordered. Radiology: ordered.  Risk Prescription drug management.   Pt comes in with cc of n/v. Has hx of Crohns. Recent SBO admission.  ? Sbo vs ileus vs crohns exacerbation vs. Hyperemesis cannabis  Admit after CT.        Derwood Kaplan, MD 01/07/24 [phone]

## 2024-01-08 DIAGNOSIS — K5090 Crohn's disease, unspecified, without complications: Secondary | ICD-10-CM | POA: Diagnosis present

## 2024-01-08 DIAGNOSIS — K5010 Crohn's disease of large intestine without complications: Secondary | ICD-10-CM | POA: Diagnosis not present

## 2024-01-08 LAB — CBC
HCT: 38.4 % — ABNORMAL LOW (ref 39.0–52.0)
Hemoglobin: 12.4 g/dL — ABNORMAL LOW (ref 13.0–17.0)
MCH: 27.3 pg (ref 26.0–34.0)
MCHC: 32.3 g/dL (ref 30.0–36.0)
MCV: 84.4 fL (ref 80.0–100.0)
Platelets: 259 10*3/uL (ref 150–400)
RBC: 4.55 MIL/uL (ref 4.22–5.81)
RDW: 14.6 % (ref 11.5–15.5)
WBC: 9.6 10*3/uL (ref 4.0–10.5)
nRBC: 0 % (ref 0.0–0.2)

## 2024-01-08 LAB — COMPREHENSIVE METABOLIC PANEL
ALT: 15 U/L (ref 0–44)
AST: 11 U/L — ABNORMAL LOW (ref 15–41)
Albumin: 3.4 g/dL — ABNORMAL LOW (ref 3.5–5.0)
Alkaline Phosphatase: 47 U/L (ref 38–126)
Anion gap: 5 (ref 5–15)
BUN: 8 mg/dL (ref 6–20)
CO2: 25 mmol/L (ref 22–32)
Calcium: 9.1 mg/dL (ref 8.9–10.3)
Chloride: 108 mmol/L (ref 98–111)
Creatinine, Ser: 0.77 mg/dL (ref 0.61–1.24)
GFR, Estimated: >60 mL/min (ref >60)
Glucose, Bld: 112 mg/dL — ABNORMAL HIGH (ref 70–99)
Potassium: 4.4 mmol/L (ref 3.5–5.1)
Sodium: 138 mmol/L (ref 135–145)
Total Bilirubin: 0.8 mg/dL (ref 0.0–1.2)
Total Protein: 6.1 g/dL — ABNORMAL LOW (ref 6.5–8.1)

## 2024-01-08 MED ORDER — OXYCODONE HCL 5 MG PO TABS: 15.0000 mg | ORAL_TABLET | ORAL | Status: DC | PRN

## 2024-01-08 MED ORDER — KETOROLAC TROMETHAMINE 15 MG/ML IJ SOLN
15.0000 mg | Freq: Four times a day (QID) | INTRAMUSCULAR | Status: DC | PRN
  Filled 2024-01-08: qty 1

## 2024-01-08 NOTE — Progress Notes (Signed)
 PROGRESS NOTE  Manuel [name] GEX:[id number] DOB: Nov 18, 2000 DOA: 01/07/2024 PCP: Pcp, No   LOS: 0 days   Brief Narrative / Interim history: 23 year old male with history of Asperger's, Crohn's disease, recently admitted and discharged 01/03/2024 with concern for Crohn's flare, came back to the hospital with recurrence of pain and nausea.  GI consulted  Subjective / 24h Interval events: States that his abdominal pain is better  Assesement and Plan: Principal Problem:   Crohn's colitis (HCC)   Principal problem Crohn's flare, active enteritis -comes to the hospital with recurrent abdominal pain and vomiting.  GI consulted, appreciate follow-up.  Continue steroids for now  Active problems Leukocytosis-due to steroids likely  Scheduled Meds:  enoxaparin (LOVENOX) injection  40 mg Subcutaneous Q24H   methylPREDNISolone (SOLU-MEDROL) injection  40 mg Intravenous Q12H   Continuous Infusions:  sodium chloride 125 mL/hr at 01/07/24 2005   PRN Meds:.acetaminophen **OR** acetaminophen, albuterol, ondansetron **OR** ondansetron (ZOFRAN) IV, oxyCODONE, traZODone  Current Outpatient Medications  Medication Instructions   predniSONE (DELTASONE) 40 mg, Oral, Daily    Diet Orders (From admission, onward)     Start     Ordered   01/07/24 0916  Diet clear liquid Room service appropriate? Yes; Fluid consistency: Thin  Diet effective now       Question Answer Comment  Room service appropriate? Yes   Fluid consistency: Thin      01/07/24 0916            DVT prophylaxis: enoxaparin (LOVENOX) injection 40 mg Start: 01/07/24 1000   Lab Results  Component Value Date   PLT 259 01/08/2024      Code Status: Full Code  Family Communication: no family at bedside   Status is: Observation The patient will require care spanning > 2 midnights and should be moved to inpatient because: Crohn's flare   Level of care: Med-Surg  Consultants:  Gastroenterology  Objective: Vitals:    01/07/24 1310 01/07/24 1731 01/07/24 2045 01/08/24 0346  BP: (!) 99/56 121/65 118/73 (!) 96/59  Pulse: 89 79 (!) 110 (!) 55  Resp: 17 17 14 16   Temp: 98.6 F (37 C) 97.7 F (36.5 C) 97.9 F (36.6 C) 97.8 F (36.6 C)  TempSrc:   Oral Oral  SpO2: 97% 98% 98% 99%  Weight:      Height:        Intake/Output Summary (Last 24 hours) at 01/08/2024 1043 Last data filed at 01/08/2024 0600 Gross per 24 hour  Intake 1980.02 ml  Output --  Net 1980.02 ml   Wt Readings from Last 3 Encounters:  01/07/24 64.9 kg  01/03/24 64.8 kg  09/16/20 89.8 kg    Examination:  Constitutional: NAD Eyes: no scleral icterus ENMT: Mucous membranes are moist.  Neck: normal, supple Respiratory: clear to auscultation bilaterally, no wheezing, no crackles Cardiovascular: Regular rate and rhythm, no murmurs / rubs / gallops. No LE edema Abdomen: non distended Musculoskeletal: no clubbing / cyanosis.   Data Reviewed: I have independently reviewed following labs and imaging studies   CBC Recent Labs  Lab 01/02/24 0423 01/03/24 0631 01/07/24 0250 01/08/24 0311  WBC 9.5 9.3 15.3* 9.6  HGB 13.5 13.0 14.5 12.4*  HCT 41.5 40.9 43.8 38.4*  PLT 311 299 318 259  MCV 83.3 84.7 82.8 84.4  MCH 27.1 26.9 27.4 27.3  MCHC 32.5 31.8 33.1 32.3  RDW 13.6 13.5 14.4 14.6    Recent Labs  Lab 01/02/24 0423 01/03/24 0631 01/07/24 0250 01/08/24 0311  NA  135 135 137 138  K 4.7 4.2 4.0 4.4  CL 100 100 102 108  CO2 27 28 26 25   GLUCOSE 109* 103* 97 112*  BUN 16 12 12 8   CREATININE 0.73 0.78 0.79 0.77  CALCIUM 9.4 9.2 9.1 9.1  AST  --   --  12* 11*  ALT  --   --  16 15  ALKPHOS  --   --  56 47  BILITOT  --   --  0.9 0.8  ALBUMIN  --   --  4.0 3.4*    ------------------------------------------------------------------------------------------------------------------ No results for input(s): "CHOL", "HDL", "LDLCALC", "TRIG", "CHOLHDL", "LDLDIRECT" in the last 72 hours.  No results found for:  "HGBA1C" ------------------------------------------------------------------------------------------------------------------ No results for input(s): "TSH", "T4TOTAL", "T3FREE", "THYROIDAB" in the last 72 hours.  Invalid input(s): "FREET3"  Cardiac Enzymes No results for input(s): "CKMB", "TROPONINI", "MYOGLOBIN" in the last 168 hours.  Invalid input(s): "CK" ------------------------------------------------------------------------------------------------------------------ No results found for: "BNP"  CBG: No results for input(s): "GLUCAP" in the last 168 hours.  No results found for this or any previous visit (from the past 240 hours).   Radiology Studies: No results found.   Pamella Pert, MD, PhD [facility]  Between 7 am - 7 pm I am available, please contact me via Amion (for emergencies) or Securechat (non urgent messages)  Between 7 pm - 7 am I am not available, please contact night coverage MD/APP via Amion

## 2024-01-09 ENCOUNTER — Other Ambulatory Visit (HOSPITAL_COMMUNITY): Payer: Self-pay

## 2024-01-09 DIAGNOSIS — K5010 Crohn's disease of large intestine without complications: Secondary | ICD-10-CM | POA: Diagnosis not present

## 2024-01-09 LAB — CBC
HCT: 40.5 % (ref 39.0–52.0)
Hemoglobin: 12.9 g/dL — ABNORMAL LOW (ref 13.0–17.0)
MCH: 27.2 pg (ref 26.0–34.0)
MCHC: 31.9 g/dL (ref 30.0–36.0)
MCV: 85.4 fL (ref 80.0–100.0)
Platelets: 252 10*3/uL (ref 150–400)
RBC: 4.74 MIL/uL (ref 4.22–5.81)
RDW: 14.7 % (ref 11.5–15.5)
WBC: 14.3 10*3/uL — ABNORMAL HIGH (ref 4.0–10.5)
nRBC: 0 % (ref 0.0–0.2)

## 2024-01-09 LAB — COMPREHENSIVE METABOLIC PANEL
ALT: 14 U/L (ref 0–44)
AST: 10 U/L — ABNORMAL LOW (ref 15–41)
Albumin: 3.7 g/dL (ref 3.5–5.0)
Alkaline Phosphatase: 49 U/L (ref 38–126)
Anion gap: 7 (ref 5–15)
BUN: 11 mg/dL (ref 6–20)
CO2: 26 mmol/L (ref 22–32)
Calcium: 9.1 mg/dL (ref 8.9–10.3)
Chloride: 105 mmol/L (ref 98–111)
Creatinine, Ser: 0.84 mg/dL (ref 0.61–1.24)
GFR, Estimated: >60 mL/min (ref >60)
Glucose, Bld: 101 mg/dL — ABNORMAL HIGH (ref 70–99)
Potassium: 4.1 mmol/L (ref 3.5–5.1)
Sodium: 138 mmol/L (ref 135–145)
Total Bilirubin: 0.9 mg/dL (ref 0.0–1.2)
Total Protein: 6.4 g/dL — ABNORMAL LOW (ref 6.5–8.1)

## 2024-01-09 LAB — MAGNESIUM: Magnesium: 2.1 mg/dL (ref 1.7–2.4)

## 2024-01-09 MED ORDER — PREDNISONE 10 MG PO TABS
ORAL_TABLET | ORAL | 0 refills | Status: AC
  Filled 2024-01-09: qty 46, 29d supply, fill #0

## 2024-01-09 MED ORDER — OXYCODONE HCL 10 MG PO TABS
10.0000 mg | ORAL_TABLET | Freq: Four times a day (QID) | ORAL | 0 refills | Status: DC | PRN
  Filled 2024-01-09: qty 16, 4d supply, fill #0

## 2024-01-09 NOTE — Plan of Care (Signed)
   Problem: Coping: Goal: Level of anxiety will decrease Outcome: Progressing   Problem: Pain Managment: Goal: General experience of comfort will improve and/or be controlled Outcome: Progressing   Problem: Safety: Goal: Ability to remain free from injury will improve Outcome: Progressing

## 2024-01-09 NOTE — Discharge Summary (Signed)
 Physician Discharge Summary  Manuel [name] VHQ:[id number] DOB: 2001/07/23 DOA: 01/07/2024  PCP: Pcp, No  Admit date: 01/07/2024 Discharge date: 01/09/2024  Admitted From: home Disposition:  home  Recommendations for Outpatient Follow-up:  Follow up with PCP in 1-2 weeks  Home Health: none Equipment/Devices: none  Discharge Condition: stable CODE STATUS: Full code  HPI: Per admitting MD, Manuel [name] is a 23 y.o. male with medical history significant for Asperger's, Crohn's disease with recent admission for partial SBO discharged home on 01/03/2024 admitted with concern for recurrent Crohn's flare.  States he had recurrence of pain and mild nausea after discharge from the hospital.  He was discharged on oral prednisone as it was felt that his obstruction likely caused by Crohn's inflammation.  He had lab work done in preparation for starting immune modulators.  Dates that after going home, he started to have more severe abdominal pain, a friend suggested that he try smoking some marijuana.  States this did not help his pain, instead made him nauseous and feel very paranoid.  He therefore came to the emergency department early this morning, workup reveals leukocytosis, CT scan as detailed below with evidence of ongoing enteritis and improvement in colon distention.  ER provider has requested [facility] consultation, and hospitalist observation admission.   Hospital Course / Discharge diagnoses: Principal Problem:   Crohn's colitis [facility]) Active Problems:   Crohn disease (HCC)   Principal problem Crohn's flare, active enteritis -comes to the hospital with recurrent abdominal pain and vomiting, after just being hospitalized for the same.  Gastroenterology consulted and followed patient while hospitalized.  He was placed again on steroids, with significant improvement in his condition.  Abdominal pain is better, he is eating, I have discussed with GI again and patient will be  discharged home in stable condition on a long steroid taper with close outpatient follow-up.   Active problems Leukocytosis-due to steroids likely  Sepsis ruled out   Discharge Instructions   Allergies as of 01/09/2024   No Known Allergies      Medication List     TAKE these medications    Oxycodone HCl 10 MG Tabs Take 1 tablet (10 mg total) by mouth every 6 (six) hours as needed for moderate pain (pain score 4-6).   predniSONE 10 MG tablet Commonly known as: DELTASONE Take 3 tablets (30 mg total) by mouth daily for 7 days, THEN 2 tablets (20 mg total) daily for 7 days, THEN 1 tablet (10 mg total) daily for 7 days, THEN 0.5 tablets (5 mg total) daily for 8 days. Start taking on: January 09, 2024 What changed: See the new instructions.        Follow-up Information     Charna Elizabeth, MD Follow up in 1 week(s).   Specialty: Gastroenterology Contact information: [address], [place] [address] [phone]                 Consultations: GI  Procedures/Studies:  CT ABDOMEN PELVIS W CONTRAST Result Date: 01/07/2024 CLINICAL DATA:  Blunt poly trauma. EXAM: CT ABDOMEN AND PELVIS WITH CONTRAST TECHNIQUE: Multidetector CT imaging of the abdomen and pelvis was performed using the standard protocol following bolus administration of intravenous contrast. RADIATION DOSE REDUCTION: This exam was performed according to the departmental dose-optimization program which includes automated exposure control, adjustment of the mA and/or kV according to patient size and/or use of iterative reconstruction technique. CONTRAST:  OMNIPAQUE IOHEXOL 300 MG/ML  SOLN COMPARISON:  12/28/2023 FINDINGS: Lower chest:  No contributory findings. Hepatobiliary: No focal liver abnormality.No evidence of biliary obstruction or stone. Pancreas: Unremarkable. Spleen: Unremarkable. Adrenals/Urinary Tract: Negative adrenals. No hydronephrosis or stone. Unremarkable bladder.  Stomach/Bowel: History of Crohn's with thicken mucosal hyperenhancing loops in the pelvis which show tethering and kinking from penetrating disease with sinus tract features. Affected loops measure up to 3.5 cm in diameter attributed to chronic partial obstructive changes, possibly from associated stricture, improved from prior. Postoperative left colon with anastomosis. Moderate stool in the colon. No perforation or abscess seen. Vascular/Lymphatic: No acute vascular abnormality. Enlarged mesenteric lymph nodes, considered reactive. Reproductive:No pathologic findings. Other: No ascites or pneumoperitoneum. Musculoskeletal: No acute abnormalities. 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 Affected loops are relatively distended and there could be fibrostenosing disease, bowel distension is improved from scan 10 days prior. Electronically Signed   By: Tiburcio Pea M.D.   On: 01/07/2024 07:53   DG Abd 2 Views Result Date: 01/01/2024 CLINICAL DATA:  Crohn disease.  Tube is clamped EXAM: ABDOMEN - 2 VIEW COMPARISON:  X-ray 12/31/2023 FINDINGS: Enteric tube overlying the midbody of the stomach. There is gas seen in nondilated loops of large bowel with scattered stool. There is air in nondilated loops of small bowel as well throughout the mid abdomen. No frank obstruction. No definite free air on these portable views. Air towards the rectum. Minimal curvature of the spine. IMPRESSION: Enteric tube.  Nonspecific bowel gas pattern.  Scattered stool. Electronically Signed   By: Karen Kays M.D.   On: 01/01/2024 12:31   DG Abd 2 Views Result Date: 12/31/2023 CLINICAL DATA:  23 year old male bowel obstruction.  Crohn disease. EXAM: ABDOMEN - 2 VIEW COMPARISON:  12/30/2023 and earlier. FINDINGS: Portable AP supine view at 0905 hours. Enteric tube remains in the left upper quadrant, side hole the level of the gastric body. Non obstructed bowel gas  pattern. No pneumoperitoneum is evident on this supine view. No osseous abnormality identified. IMPRESSION: 1. Stable enteric tube terminating in the stomach. 2.  Non obstructed bowel gas pattern. Electronically Signed   By: Odessa Fleming M.D.   On: 12/31/2023 12:54   DG Abd 1 View Result Date: 12/30/2023 CLINICAL DATA:  Small bowel obstruction. EXAM: ABDOMEN - 1 VIEW COMPARISON:  December 28, 2023. FINDINGS: The bowel gas pattern is normal. Distal tip of nasogastric tube is seen in stomach. No radio-opaque calculi or other significant radiographic abnormality are seen. IMPRESSION: No abnormal bowel dilatation. Electronically Signed   By: Lupita Raider M.D.   On: 12/30/2023 09:42   DG Abdomen 1 View Result Date: 12/28/2023 CLINICAL DATA:  23 year old male enteric tube placement. EXAM: ABDOMEN - 1 VIEW COMPARISON:  CTA CT Chest, Abdomen, and Pelvis 0325 hours today. FINDINGS: AP view at 0609 hours. Enteric tube placed into the stomach, side hole the level of the gastric fundus. Contrast being excreted into nondilated renal collecting systems and ureters. Contrast partially visible in the urinary bladder. Stable bowel gas pattern. IMPRESSION: Satisfactory enteric tube placement into the stomach. Electronically Signed   By: Odessa Fleming M.D.   On: 12/28/2023 07:09   CT Angio Chest/Abd/Pel for Dissection W and/or W/WO Result Date: 12/28/2023 CLINICAL DATA:  Nausea and vomiting. History of bowel resection. Crohn's disease. Abdominal pain. EXAM: CT ANGIOGRAPHY CHEST, ABDOMEN AND PELVIS TECHNIQUE: Non-contrast CT of the chest was initially obtained. Multidetector CT imaging through the chest, abdomen and pelvis was performed using the standard protocol  during bolus administration of intravenous contrast. Multiplanar reconstructed images and MIPs were obtained and reviewed to evaluate the vascular anatomy. RADIATION DOSE REDUCTION: This exam was performed according to the departmental dose-optimization program which includes  automated exposure control, adjustment of the mA and/or kV according to patient size and/or use of iterative reconstruction technique. CONTRAST:  OMNIPAQUE IOHEXOL 350 MG/ML SOLN COMPARISON:  Radiographs 12/29/2012 and 12/28/2012 FINDINGS: CTA CHEST FINDINGS Cardiovascular: Normal heart size. No pericardial effusion. No pulmonary embolism. No acute aortic syndrome. Mediastinum/Nodes: Trachea and esophagus are unremarkable. No thoracic adenopathy. Lungs/Pleura: The lungs are clear. No pleural effusion or pneumothorax. Musculoskeletal: No acute fracture. Review of the MIP images confirms the above findings. CTA ABDOMEN AND PELVIS FINDINGS VASCULAR Aorta: Normal caliber aorta without aneurysm, dissection, vasculitis or significant stenosis. Celiac: Patent without evidence of aneurysm, dissection, vasculitis or significant stenosis. SMA: Patent without evidence of aneurysm, dissection, vasculitis or significant stenosis. Renals: Both renal arteries are patent without evidence of aneurysm, dissection, vasculitis, fibromuscular dysplasia or significant stenosis. IMA: Patent without evidence of aneurysm, dissection, vasculitis or significant stenosis. Inflow: Patent without evidence of aneurysm, dissection, vasculitis or significant stenosis. Veins: The IVC and SMV are not opacified favored due to contrast timing. Grossly patent portal and splenic veins. Review of the MIP images confirms the above findings. NON-VASCULAR Hepatobiliary: No acute abnormality. Pancreas: No acute abnormality. Spleen: Unremarkable. Adrenals/Urinary Tract: Unremarkable adrenal glands and kidneys. No hydronephrosis. Unremarkable bladder. Stomach/Bowel: Postoperative change of partial colectomy with anastomosis in the left upper quadrant. The stomach is within normal limits. Multiple dilated loops of small bowel in the pelvis measuring up to 4.6 cm in diameter. Between the dilated loops of small bowel there are multiple segments of  small-bowel wall thickening with mucosal hyperenhancement. Findings are suspicious for partial small bowel obstruction due to inflamed strictures. The appendix is not clearly visualized. Lymphatic: Borderline enlarged mesenteric lymph nodes are likely reactive. Reproductive: Unremarkable. Other: Small volume free fluid in the pelvis. No free intraperitoneal air. Musculoskeletal: No acute fracture. Review of the MIP images confirms the above findings. IMPRESSION: 1. Multiple dilated loops of small bowel in the pelvis measuring up to 4.6 cm in diameter. Between the dilated segments of small bowel there are multiple segments of small-bowel wall thickening with mucosal hyperenhancement. Findings are suspicious for partial small bowel obstruction due to enteritis and inflamed strictures. 2. Small volume free fluid in the pelvis. No abscess or free intraperitoneal air. 3. No acute aortic syndrome. No pulmonary embolism. Electronically Signed   By: Minerva Fester M.D.   On: 12/28/2023 03:58     Subjective: - no chest pain, shortness of breath, no abdominal pain, nausea or vomiting.   Discharge Exam: BP (!) 89/60 (BP Location: Right Arm)   Pulse 62   Temp 98 F (36.7 C)   Resp 18   Ht 6\' 3"  (1.905 m)   Wt 64.9 kg   SpO2 98%   BMI 17.87 kg/m   General: Pt is alert, awake, not in acute distress Cardiovascular: RRR, S1/S2 +, no rubs, no gallops Respiratory: CTA bilaterally, no wheezing, no rhonchi Abdominal: Soft, NT, ND, bowel sounds + Extremities: no edema, no cyanosis    The results of significant diagnostics from this hospitalization (including imaging, microbiology, ancillary and laboratory) are listed below for reference.     Microbiology: No results found for this or any previous visit (from the past 240 hours).   Labs: Basic Metabolic Panel: Recent Labs  Lab 01/03/24 0631 01/07/24 0250 01/08/24  1610 01/09/24 0305  NA 135 137 138 138  K 4.2 4.0 4.4 4.1  CL 100 102 108 105   CO2 28 26 25 26   GLUCOSE 103* 97 112* 101*  BUN 12 12 8 11   CREATININE 0.78 0.79 0.77 0.84  CALCIUM 9.2 9.1 9.1 9.1  MG  --   --   --  2.1   Liver Function Tests: Recent Labs  Lab 01/07/24 0250 01/08/24 0311 01/09/24 0305  AST 12* 11* 10*  ALT 16 15 14   ALKPHOS 56 47 49  BILITOT 0.9 0.8 0.9  PROT 7.2 6.1* 6.4*  ALBUMIN 4.0 3.4* 3.7   CBC: Recent Labs  Lab 01/03/24 0631 01/07/24 0250 01/08/24 0311 01/09/24 0305  WBC 9.3 15.3* 9.6 14.3*  HGB 13.0 14.5 12.4* 12.9*  HCT 40.9 43.8 38.4* 40.5  MCV 84.7 82.8 84.4 85.4  PLT 299 318 259 252   CBG: No results for input(s): "GLUCAP" in the last 168 hours. Hgb A1c No results for input(s): "HGBA1C" in the last 72 hours. Lipid Profile No results for input(s): "CHOL", "HDL", "LDLCALC", "TRIG", "CHOLHDL", "LDLDIRECT" in the last 72 hours. Thyroid function studies No results for input(s): "TSH", "T4TOTAL", "T3FREE", "THYROIDAB" in the last 72 hours.  Invalid input(s): "FREET3" Urinalysis    Component Value Date/Time   COLORURINE YELLOW 01/07/2024 0652   APPEARANCEUR CLEAR 01/07/2024 0652   LABSPEC 1.028 01/07/2024 0652   PHURINE 5.0 01/07/2024 0652   GLUCOSEU NEGATIVE 01/07/2024 0652   HGBUR NEGATIVE 01/07/2024 0652   BILIRUBINUR NEGATIVE 01/07/2024 0652   KETONESUR 5 (A) 01/07/2024 0652   PROTEINUR NEGATIVE 01/07/2024 0652   NITRITE NEGATIVE 01/07/2024 0652   LEUKOCYTESUR NEGATIVE 01/07/2024 9604    FURTHER DISCHARGE INSTRUCTIONS:   Get Medicines reviewed and adjusted: Please take all your medications with you for your next visit with your Primary MD   Laboratory/radiological data: Please request your Primary MD to go over all hospital tests and procedure/radiological results at the follow up, please ask your Primary MD to get all Hospital records sent to his/her office.   In some cases, they will be blood work, cultures and biopsy results pending at the time of your discharge. Please request that your primary  care M.D. goes through all the records of your hospital data and follows up on these results.   Also Note the following: If you experience worsening of your admission symptoms, develop shortness of breath, life threatening emergency, suicidal or homicidal thoughts you must seek medical attention immediately by calling 911 or calling your MD immediately  if symptoms less severe.   You must read complete instructions/literature along with all the possible adverse reactions/side effects for all the Medicines you take and that have been prescribed to you. Take any new Medicines after you have completely understood and accpet all the possible adverse reactions/side effects.    Do not drive when taking Pain medications or sleeping medications (Benzodaizepines)   Do not take more than prescribed Pain, Sleep and Anxiety Medications. It is not advisable to combine anxiety,sleep and pain medications without talking with your primary care practitioner   Special Instructions: If you have smoked or chewed Tobacco  in the last 2 yrs please stop smoking, stop any regular Alcohol  and or any Recreational drug use.   Wear Seat belts while driving.   Please note: You were cared for by a hospitalist during your hospital stay. Once you are discharged, your primary care physician will handle any further medical issues. Please note  that NO REFILLS for any discharge medications will be authorized once you are discharged, as it is imperative that you return to your primary care physician (or establish a relationship with a primary care physician if you do not have one) for your post hospital discharge needs so that they can reassess your need for medications and monitor your lab values.  Time coordinating discharge: 35 minutes  SIGNED:  Pamella Pert, MD, PhD 01/09/2024, 9:11 AM

## 2024-01-09 NOTE — Progress Notes (Signed)
Discharge package printed and instructions given to pt. Pt verbalizes understanding. 

## 2024-01-09 NOTE — Plan of Care (Signed)
   Problem: Activity: Goal: Risk for activity intolerance will decrease Outcome: Progressing   Problem: Pain Managment: Goal: General experience of comfort will improve and/or be controlled Outcome: Progressing   Problem: Safety: Goal: Ability to remain free from injury will improve Outcome: Progressing

## 2024-06-15 ENCOUNTER — Ambulatory Visit: Payer: Self-pay | Admitting: Podiatry

## 2024-06-27 DIAGNOSIS — H9311 Tinnitus, right ear: Secondary | ICD-10-CM | POA: Insufficient documentation

## 2024-08-08 ENCOUNTER — Encounter (HOSPITAL_COMMUNITY): Payer: Self-pay | Admitting: *Deleted

## 2024-08-08 ENCOUNTER — Other Ambulatory Visit: Payer: Self-pay

## 2024-08-08 ENCOUNTER — Emergency Department (HOSPITAL_COMMUNITY)
Admission: EM | Admit: 2024-08-08 | Discharge: 2024-08-08 | Discharge status: Against Medical Advice | Attending: Emergency Medicine | Admitting: Emergency Medicine

## 2024-08-08 DIAGNOSIS — Z5321 Procedure and treatment not carried out due to patient leaving prior to being seen by health care provider: Secondary | ICD-10-CM | POA: Insufficient documentation

## 2024-08-08 DIAGNOSIS — H9319 Tinnitus, unspecified ear: Secondary | ICD-10-CM | POA: Insufficient documentation

## 2024-08-08 NOTE — ED Triage Notes (Signed)
 Pt appears anxious, presented to the ED after waking up with ringing on ears, reports being unable to hear, family member beside.

## 2024-08-14 ENCOUNTER — Ambulatory Visit (HOSPITAL_COMMUNITY)
Admission: EM | Admit: 2024-08-14 | Discharge: 2024-08-15 | Disposition: A | Discharge status: Home / Self Care | Attending: Nurse Practitioner | Admitting: Nurse Practitioner

## 2024-08-14 DIAGNOSIS — Z560 Unemployment, unspecified: Secondary | ICD-10-CM | POA: Diagnosis not present

## 2024-08-14 DIAGNOSIS — F321 Major depressive disorder, single episode, moderate: Secondary | ICD-10-CM | POA: Insufficient documentation

## 2024-08-14 DIAGNOSIS — F419 Anxiety disorder, unspecified: Secondary | ICD-10-CM | POA: Diagnosis not present

## 2024-08-14 DIAGNOSIS — Z818 Family history of other mental and behavioral disorders: Secondary | ICD-10-CM | POA: Insufficient documentation

## 2024-08-14 DIAGNOSIS — H6691 Otitis media, unspecified, right ear: Secondary | ICD-10-CM | POA: Diagnosis not present

## 2024-08-14 DIAGNOSIS — K5090 Crohn's disease, unspecified, without complications: Secondary | ICD-10-CM | POA: Insufficient documentation

## 2024-08-14 DIAGNOSIS — F429 Obsessive-compulsive disorder, unspecified: Secondary | ICD-10-CM | POA: Diagnosis not present

## 2024-08-14 DIAGNOSIS — F411 Generalized anxiety disorder: Secondary | ICD-10-CM | POA: Insufficient documentation

## 2024-08-14 DIAGNOSIS — F845 Asperger's syndrome: Secondary | ICD-10-CM | POA: Diagnosis not present

## 2024-08-14 DIAGNOSIS — R45851 Suicidal ideations: Secondary | ICD-10-CM | POA: Insufficient documentation

## 2024-08-14 DIAGNOSIS — Z79899 Other long term (current) drug therapy: Secondary | ICD-10-CM | POA: Diagnosis not present

## 2024-08-14 DIAGNOSIS — F32A Depression, unspecified: Secondary | ICD-10-CM | POA: Diagnosis present

## 2024-08-14 DIAGNOSIS — H905 Unspecified sensorineural hearing loss: Secondary | ICD-10-CM | POA: Diagnosis not present

## 2024-08-14 LAB — COMPREHENSIVE METABOLIC PANEL WITH GFR
ALT: 13 U/L (ref 0–44)
AST: 18 U/L (ref 15–41)
Albumin: 4.2 g/dL (ref 3.5–5.0)
Alkaline Phosphatase: 73 U/L (ref 38–126)
Anion gap: 10 (ref 5–15)
BUN: 10 mg/dL (ref 6–20)
CO2: 26 mmol/L (ref 22–32)
Calcium: 8.9 mg/dL (ref 8.9–10.3)
Chloride: 103 mmol/L (ref 98–111)
Creatinine, Ser: 1.01 mg/dL (ref 0.61–1.24)
GFR, Estimated: >60 mL/min (ref >60)
Glucose, Bld: 89 mg/dL (ref 70–99)
Potassium: 3.4 mmol/L — ABNORMAL LOW (ref 3.5–5.1)
Sodium: 139 mmol/L (ref 135–145)
Total Bilirubin: 0.8 mg/dL (ref 0.0–1.2)
Total Protein: 7 g/dL (ref 6.5–8.1)

## 2024-08-14 LAB — POCT URINE DRUG SCREEN - MANUAL ENTRY (I-SCREEN)
POC Amphetamine UR: NOT DETECTED
POC Buprenorphine (BUP): NOT DETECTED
POC Cocaine UR: NOT DETECTED
POC Marijuana UR: POSITIVE — AB
POC Methadone UR: NOT DETECTED
POC Methamphetamine UR: NOT DETECTED
POC Morphine: NOT DETECTED
POC Oxazepam (BZO): NOT DETECTED
POC Oxycodone UR: NOT DETECTED
POC Secobarbital (BAR): NOT DETECTED

## 2024-08-14 LAB — CBC WITH DIFFERENTIAL/PLATELET
Abs Immature Granulocytes: 0.01 K/uL (ref 0.00–0.07)
Basophils Absolute: 0 K/uL (ref 0.0–0.1)
Basophils Relative: 1 %
Eosinophils Absolute: 0.1 K/uL (ref 0.0–0.5)
Eosinophils Relative: 1 %
HCT: 42.5 % (ref 39.0–52.0)
Hemoglobin: 14.3 g/dL (ref 13.0–17.0)
Immature Granulocytes: 0 %
Lymphocytes Relative: 20 %
Lymphs Abs: 1.4 K/uL (ref 0.7–4.0)
MCH: 29.3 pg (ref 26.0–34.0)
MCHC: 33.6 g/dL (ref 30.0–36.0)
MCV: 87.1 fL (ref 80.0–100.0)
Monocytes Absolute: 0.7 K/uL (ref 0.1–1.0)
Monocytes Relative: 10 %
Neutro Abs: 4.5 K/uL (ref 1.7–7.7)
Neutrophils Relative %: 68 %
Platelets: 210 K/uL (ref 150–400)
RBC: 4.88 MIL/uL (ref 4.22–5.81)
RDW: 12.5 % (ref 11.5–15.5)
WBC: 6.7 K/uL (ref 4.0–10.5)
nRBC: 0 % (ref 0.0–0.2)

## 2024-08-14 LAB — HEMOGLOBIN A1C
Hgb A1c MFr Bld: 4.7 % — ABNORMAL LOW (ref 4.8–5.6)
Mean Plasma Glucose: 88.19 mg/dL

## 2024-08-14 LAB — LIPID PANEL
Cholesterol: 136 mg/dL (ref 0–200)
HDL: 57 mg/dL (ref >40)
LDL Cholesterol: 56 mg/dL (ref 0–99)
Total CHOL/HDL Ratio: 2.4 ratio
Triglycerides: 115 mg/dL (ref <150)
VLDL: 23 mg/dL (ref 0–40)

## 2024-08-14 MED ORDER — CLONAZEPAM 0.25 MG PO TBDP
0.5000 mg | ORAL_TABLET | Freq: Every day | ORAL | Status: DC
Start: 2024-08-15 — End: 2024-08-15
  Filled 2024-08-14: qty 2

## 2024-08-14 MED ORDER — OLANZAPINE 5 MG PO TBDP
5.0000 mg | ORAL_TABLET | Freq: Every day | ORAL | Status: DC
Start: 2024-08-15 — End: 2024-08-15
  Administered 2024-08-15: 5 mg via ORAL
  Filled 2024-08-14: qty 1

## 2024-08-14 MED ORDER — LORAZEPAM 2 MG/ML IJ SOLN: 2.0000 mg | Freq: Three times a day (TID) | INTRAMUSCULAR | Status: DC | PRN

## 2024-08-14 MED ORDER — NICOTINE POLACRILEX 2 MG MT GUM
2.0000 mg | CHEWING_GUM | Freq: Every day | OROMUCOSAL | Status: DC | PRN
  Administered 2024-08-14: 2 mg via ORAL
  Filled 2024-08-14: qty 1

## 2024-08-14 MED ORDER — DIPHENHYDRAMINE HCL 50 MG/ML IJ SOLN: 50.0000 mg | Freq: Three times a day (TID) | INTRAMUSCULAR | Status: DC | PRN

## 2024-08-14 MED ORDER — DIPHENHYDRAMINE HCL 50 MG PO CAPS: 50.0000 mg | ORAL_CAPSULE | Freq: Three times a day (TID) | ORAL | Status: DC | PRN

## 2024-08-14 MED ORDER — HALOPERIDOL 5 MG PO TABS: 5.0000 mg | ORAL_TABLET | Freq: Three times a day (TID) | ORAL | Status: DC | PRN

## 2024-08-14 MED ORDER — CLONAZEPAM 0.5 MG PO TABS: 0.5000 mg | ORAL_TABLET | Freq: Every day | ORAL | Status: DC

## 2024-08-14 MED ORDER — HYDROXYZINE HCL 25 MG PO TABS
25.0000 mg | ORAL_TABLET | Freq: Three times a day (TID) | ORAL | Status: DC | PRN
  Administered 2024-08-14: 25 mg via ORAL
  Filled 2024-08-14: qty 1

## 2024-08-14 MED ORDER — MELATONIN 5 MG PO TABS
5.0000 mg | ORAL_TABLET | Freq: Every evening | ORAL | Status: DC | PRN
  Administered 2024-08-14: 5 mg via ORAL
  Filled 2024-08-14: qty 1

## 2024-08-14 MED ORDER — ACETAMINOPHEN 325 MG PO TABS: 650.0000 mg | ORAL_TABLET | Freq: Four times a day (QID) | ORAL | Status: DC | PRN

## 2024-08-14 MED ORDER — MAGNESIUM HYDROXIDE 400 MG/5ML PO SUSP: 30.0000 mL | Freq: Every day | ORAL | Status: DC | PRN

## 2024-08-14 MED ORDER — OLANZAPINE 10 MG PO TBDP
10.0000 mg | ORAL_TABLET | Freq: Every day | ORAL | Status: DC
  Administered 2024-08-14: 10 mg via ORAL
  Filled 2024-08-14: qty 1

## 2024-08-14 MED ORDER — HALOPERIDOL LACTATE 5 MG/ML IJ SOLN: 10.0000 mg | Freq: Three times a day (TID) | INTRAMUSCULAR | Status: DC | PRN

## 2024-08-14 MED ORDER — HALOPERIDOL LACTATE 5 MG/ML IJ SOLN: 5.0000 mg | Freq: Three times a day (TID) | INTRAMUSCULAR | Status: DC | PRN

## 2024-08-14 MED ORDER — ALUM & MAG HYDROXIDE-SIMETH 200-200-20 MG/5ML PO SUSP: 30.0000 mL | ORAL | Status: DC | PRN

## 2024-08-14 MED ORDER — NICOTINE 21 MG/24HR TD PT24
21.0000 mg | MEDICATED_PATCH | Freq: Every day | TRANSDERMAL | Status: DC
  Administered 2024-08-15: 21 mg via TRANSDERMAL
  Filled 2024-08-14: qty 1

## 2024-08-14 NOTE — ED Notes (Signed)
 Patient arrived to the unit wearing scrubs.  Patient is alert and oriented x4 with no acute distress noted.  Patient was given food and drink. Patient appears nervous and anxious upon arrival to the unit.  Patient currently states his anxiety is a 7/10.  Patient was given scheduled Zyprexa 10mg  and PRN Hydroxyzine 25mg  and Melatonin 5mg .  Patient verbalized understanding. Overall, patient has been calm and cooperative with this Clinical research associate, however has made statements such as  can you continue to talk to me?. He also has been observed pacing  the unit, but doesn't have any safety concerns behaviors at this time.  Patient currently denies SI/HI/AVH at this time.  Patient also expressed complaints of ringing in his ears (tinnitus), which he states may be due to the medications prescribed. Patient appears to be nervous stating he has never done this before and doesn't know what to expect.  Patient was informed staff is available if he has any concerns and will continue to be monitored for safety.

## 2024-08-14 NOTE — ED Provider Notes (Signed)
 [facility] Continuous Assessment Admission H&P  Date: 08/15/24 Patient Name: Manuel [name] MRN: [id number] Chief Complaint:  My tinnitus is causing me severe anxiety and other problems.   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  Patient was seen face-to-face by this provider and chart reviewed.  Patient gave permission for provider to speak with his mother and girlfriend during the evaluation.  On approach, patient presents as very anxious and restless.  He reports I have severe tinnitus, one of the worst cases and it is causing very severe anxiety.  I went to see my psychiatrist, they got me on new medications, I was prescribed clonazepam for anxiety caused by the extreme tinnitus.Today, I scared my family and girlfriend so I came here to get checked out.  Patient's girlfriend reports he was also prescribed olanzapine.  Patient reports he is unsure if the clonazepam 0.5 mg is effective as his symptoms had gotten worse since he has been taking it daily for 10 months. Patient reports he also takes olanzapine 5 mg in the morning and 10 mg at bedtime for his mood. Pt reports being unsure of his current medications and is requesting medication management/adjustments.   Patient's girlfriend reports that this afternoon, he expressed a lot of suicidal ideations and it has happened before.  She reports the patient is very restless, can't sit still because he was experiencing lots of  ringing in his right ear.  Patient endorses suicidal ideations ongoing for almost 3 months.  He reports it has been worsening over time and blames his helplessness on his tinnitus..  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  Patient reports he is fixated on his tinnitus because of the loud ringing and he has seen an ENT doctor who diagnosed and told him there was no cure but his symptoms can be managed.  Patient reports he uses a nicotine  vape daily.  Patient's mother [name] reports he was recently seen by Dr. Charlanne his psychiatrist who was worried the patient was experiencing ruminations, suicidal ideations and auditory hallucinations.   Patient blames his psychiatric conditions on his tinnitus.   Patient's mother reports he has stated loudly that he cannot function, cannot sleep, cannot walk, and has now become obsessed.   She reports the patient is suffering, stating his stress and anxiety has taken over his life and he has expressed suicidal thoughts and said he cannot live this way. She reports the patient might get brief relief for an hour, but then it gets worse and worse.   She reports they recently went to the ED 08/08/24, due to increased anxiety, but LWBS after a long wait time.  She reports the patient has expressed to family and friends that the tinnitus and subsequent psychiatric conditions has destroyed his life, stating that his life was over and he  cannot live this way and he was done.   On evaluation, patient is alert, oriented x 3, and cooperative. Speech is clear, and coherent. Pt appears casually dressed. Eye contact is good. Mood is anxious and depressed, affect is congruent with mood. Thought process is coherent and thought content is perseverative with  rumination and obsession. Pt endorses SI, no plan, denies HI/AVH. There is no objective indication that the patient is responding to internal stimuli. No delusions elicited during this assessment.    Discussed recommendation for inpatient psychiatric admission for stabilization and treatment.  Discussed inpatient milieu and expectations.  Patient is provided with opportunity for questions.  He verbalized understanding and is in agreement.  Patient reports he sleeps with a white noise machine due to his Tinnitus.  If admitted inpatient at [facility], counselor and SW will work together to accommodate patient's needs.   Pt admitted to the continuous observation unit for safety monitoring pending transfer to an inpatient psychiatric unit. LCSW will seek bed placement.  Total Time spent with patient: 45 minutes  Musculoskeletal  Strength & Muscle Tone: within normal limits Gait & Station: normal Patient leans: N/A  Psychiatric Specialty Exam  Presentation General Appearance:  Casual  Eye Contact: Good  Speech: Clear and Coherent  Speech Volume: Increased  Handedness: Right   Mood and Affect  Mood: Anxious; Depressed  Affect: Congruent   Thought Process  Thought Processes: Coherent  Descriptions of Associations:Intact  Orientation:Full (Time, Place and Person)  Thought Content:Perseveration; Rumination; Obsessions    Hallucinations:Hallucinations: None  Ideas of Reference:None  Suicidal Thoughts:Suicidal Thoughts: Yes, Active SI Active Intent and/or Plan: Without Plan  Homicidal Thoughts:Homicidal Thoughts: No   Sensorium  Memory: Immediate Fair  Judgment: Poor  Insight: Shallow   Executive Functions  Concentration: Poor  Attention Span: Poor  Recall: Fair  Fund of Knowledge: Fair  Language: Fair   Psychomotor Activity  Psychomotor Activity: Psychomotor Activity: Mannerisms; Restlessness   Assets  Assets: Manufacturing systems engineer; Desire  for Improvement   Sleep  Sleep: Sleep: Poor   Nutritional Assessment (For OBS and FBC admissions only) Has the patient had a weight loss or gain of 10 pounds or more in the last 3 months?: No Has the patient had a decrease in food intake/or appetite?: No Does the patient have dental problems?: No Does the patient have eating habits or behaviors that may be indicators of an eating disorder including binging or inducing vomiting?: No Has the patient recently lost weight without trying?: 0 Has the patient been eating poorly because of a decreased appetite?: 0 Malnutrition Screening Tool Score: 0    Physical Exam Constitutional:      Appearance: He is not diaphoretic.  HENT:     Nose: No congestion.  Cardiovascular:     Rate and Rhythm: Normal rate.  Pulmonary:     Effort: No respiratory distress.  Chest:     Chest wall: No tenderness.  Neurological:     Mental Status: He is alert and oriented to person, place, and time.  Psychiatric:        Mood and Affect: Mood is anxious and depressed. Affect is labile and inappropriate.        Behavior: Behavior is cooperative.        Thought Content: Thought content includes suicidal ideation.        Judgment: Judgment is inappropriate.    Review of Systems  Constitutional:  Negative for chills, diaphoresis and fever.  HENT:  Negative for congestion.  Eyes:  Negative for discharge.  Respiratory:  Negative for cough, shortness of breath and wheezing.   Cardiovascular:  Negative for chest pain and palpitations.  Gastrointestinal:  Negative for diarrhea, nausea and vomiting.  Neurological:  Negative for dizziness, seizures, weakness and headaches.  Psychiatric/Behavioral:  Positive for substance abuse and suicidal ideas. The patient is nervous/anxious and has insomnia.     Blood pressure (!) 111/90, pulse 90, temperature 97.9 F (36.6 C), resp. rate 16, SpO2 100%. There is no height or weight on file to calculate BMI.  Past  Psychiatric History: See H & P   Is the patient at risk to self? Yes  Has the patient been a risk to self in the past 6 months? Yes .    Has the patient been a risk to self within the distant past? No   Is the patient a risk to others? No   Has the patient been a risk to others in the past 6 months? No   Has the patient been a risk to others within the distant past? No   Past Medical History: See Chart  Family History: N/A  Social History: N/A  Last Labs:  Admission on 08/14/2024  Component Date Value Ref Range Status   WBC 08/14/2024 6.7  4.0 - 10.5 K/uL Final   RBC 08/14/2024 4.88  4.22 - 5.81 MIL/uL Final   Hemoglobin 08/14/2024 14.3  13.0 - 17.0 g/dL Final   HCT 89/78/7974 42.5  39.0 - 52.0 % Final   MCV 08/14/2024 87.1  80.0 - 100.0 fL Final   MCH 08/14/2024 29.3  26.0 - 34.0 pg Final   MCHC 08/14/2024 33.6  30.0 - 36.0 g/dL Final   RDW 89/78/7974 12.5  11.5 - 15.5 % Final   Platelets 08/14/2024 210  150 - 400 K/uL Final   nRBC 08/14/2024 0.0  0.0 - 0.2 % Final   Neutrophils Relative % 08/14/2024 68  % Final   Neutro Abs 08/14/2024 4.5  1.7 - 7.7 K/uL Final   Lymphocytes Relative 08/14/2024 20  % Final   Lymphs Abs 08/14/2024 1.4  0.7 - 4.0 K/uL Final   Monocytes Relative 08/14/2024 10  % Final   Monocytes Absolute 08/14/2024 0.7  0.1 - 1.0 K/uL Final   Eosinophils Relative 08/14/2024 1  % Final   Eosinophils Absolute 08/14/2024 0.1  0.0 - 0.5 K/uL Final   Basophils Relative 08/14/2024 1  % Final   Basophils Absolute 08/14/2024 0.0  0.0 - 0.1 K/uL Final   Immature Granulocytes 08/14/2024 0  % Final   Abs Immature Granulocytes 08/14/2024 0.01  0.00 - 0.07 K/uL Final   Performed at [facility], [address], [place], [place] [address]   Sodium 08/14/2024 139  135 - 145 mmol/L Final   Potassium 08/14/2024 3.4 (L)  3.5 - 5.1 mmol/L Final   Chloride 08/14/2024 103  98 - 111 mmol/L Final   CO2 08/14/2024 26  22 - 32 mmol/L Final   Glucose, Bld 08/14/2024 89  70 -  99 mg/dL Final   Glucose reference range applies only to samples taken after fasting for at least 8 hours.   BUN 08/14/2024 10  6 - 20 mg/dL Final   Creatinine, Ser 08/14/2024 1.01  0.61 - 1.24 mg/dL Final   Calcium 89/78/7974 8.9  8.9 - 10.3 mg/dL Final   Total Protein 89/78/7974 7.0  6.5 - 8.1 g/dL Final   Albumin 89/78/7974 4.2  3.5 - 5.0 g/dL Final  AST 08/14/2024 18  15 - 41 U/L Final   ALT 08/14/2024 13  0 - 44 U/L Final   Alkaline Phosphatase 08/14/2024 73  38 - 126 U/L Final   Total Bilirubin 08/14/2024 0.8  0.0 - 1.2 mg/dL Final   GFR, Estimated 08/14/2024 >60  >60 mL/min Final   Comment: (NOTE) Calculated using the CKD-EPI Creatinine Equation (2021)    Anion gap 08/14/2024 10  5 - 15 Final   Performed at [facility], [address] [place], [place] [address]   Hgb A1c MFr Bld 08/14/2024 4.7 (L)  4.8 - 5.6 % Final   Comment: (NOTE) Diagnosis of Diabetes The following HbA1c ranges recommended by the American Diabetes Association (ADA) may be used as an aid in the diagnosis of diabetes mellitus.  Hemoglobin             Suggested A1C NGSP%              Diagnosis  <5.7                   Non Diabetic  5.7-6.4                Pre-Diabetic  >6.4                   Diabetic  <7.0                   Glycemic control for                       adults with diabetes.     Mean Plasma Glucose 08/14/2024 88.19  mg/dL Final   Performed at [facility], [address] [place], [place] [address]   Cholesterol 08/14/2024 136  0 - 200 mg/dL Final   Triglycerides 89/78/7974 115  <150 mg/dL Final   HDL 89/78/7974 57  >40 mg/dL Final   Total CHOL/HDL Ratio 08/14/2024 2.4  RATIO Final   VLDL 08/14/2024 23  0 - 40 mg/dL Final   LDL Cholesterol 08/14/2024 56  0 - 99 mg/dL Final   Comment:        Total Cholesterol/HDL:CHD Risk Coronary Heart Disease Risk Table                     Men   Women  1/2 Average Risk   3.4   3.3  Average Risk       5.0   4.4  2 X Average Risk    9.6   7.1  3 X Average Risk  23.4   11.0        Use the calculated Patient Ratio above and the CHD Risk Table to determine the patient's CHD Risk.        ATP III CLASSIFICATION (LDL):  <100     mg/dL   Optimal  899-870  mg/dL   Near or Above                    Optimal  130-159  mg/dL   Borderline  839-810  mg/dL   High  >809     mg/dL   Very High Performed at [facility], [address], [place], [place] [address]    TSH 08/14/2024 1.703  0.350 - 4.500 uIU/mL Final   Comment: Performed by a 3rd Generation assay with a functional sensitivity of <=0.01 uIU/mL. Performed at [facility], [address] [place], [place] [address]    POC Amphetamine UR 08/14/2024 None Detected  NONE DETECTED (Cut Off Level 1000 ng/mL) Corrected   POC Secobarbital (BAR) 08/14/2024 None Detected  NONE DETECTED (Cut Off Level 300 ng/mL) Corrected   POC Buprenorphine (BUP) 08/14/2024 None Detected  NONE DETECTED (Cut Off Level 10 ng/mL) Corrected   POC Oxazepam (BZO) 08/14/2024 None Detected  NONE DETECTED (Cut Off Level 300 ng/mL) Corrected   POC Cocaine UR 08/14/2024 None Detected  NONE DETECTED (Cut Off Level 300 ng/mL) Corrected   POC Methamphetamine UR 08/14/2024 None Detected  NONE DETECTED (Cut Off Level 1000 ng/mL) Corrected   POC Morphine  08/14/2024 None Detected  NONE DETECTED (Cut Off Level 300 ng/mL) Corrected   POC Methadone UR 08/14/2024 None Detected  NONE DETECTED (Cut Off Level 300 ng/mL) Corrected   POC Oxycodone  UR 08/14/2024 None Detected  NONE DETECTED (Cut Off Level 100 ng/mL) Corrected   POC Marijuana UR 08/14/2024 Positive (A)  NONE DETECTED (Cut Off Level 50 ng/mL) Corrected    Allergies: Patient has no known allergies.  Medications:  Facility Ordered Medications  Medication   acetaminophen  (TYLENOL ) tablet 650 mg   alum & mag hydroxide-simeth (MAALOX/MYLANTA) 200-200-20 MG/5ML suspension 30 mL   magnesium hydroxide (MILK OF MAGNESIA) suspension 30 mL    haloperidol (HALDOL) tablet 5 mg   And   diphenhydrAMINE (BENADRYL) capsule 50 mg   haloperidol lactate (HALDOL) injection 5 mg   And   diphenhydrAMINE (BENADRYL) injection 50 mg   And   LORazepam (ATIVAN) injection 2 mg   haloperidol lactate (HALDOL) injection 10 mg   And   diphenhydrAMINE (BENADRYL) injection 50 mg   And   LORazepam (ATIVAN) injection 2 mg   nicotine  (NICODERM CQ  - dosed in mg/24 hours) patch 21 mg   OLANZapine zydis (ZYPREXA) disintegrating tablet 5 mg   And   OLANZapine zydis (ZYPREXA) disintegrating tablet 10 mg   hydrOXYzine (ATARAX) tablet 25 mg   melatonin tablet 5 mg   clonazePAM (KLONOPIN) disintegrating tablet 0.5 mg   nicotine  polacrilex (NICORETTE) gum 2 mg   PTA Medications  Medication Sig   Oxycodone  HCl 10 MG TABS Take 1 tablet (10 mg total) by mouth every 6 (six) hours as needed for moderate pain (pain score 4-6).      Medical Decision Making  Recommend inpatient psychiatric admission for stabilization and treatment. Lab Orders         CBC with Differential/Platelet         Comprehensive metabolic panel         Hemoglobin A1c         Lipid panel         TSH         POCT Urine Drug Screen - (I-Screen)     EKG  Home medications continued -Zyprexa 5 mg p.o. daily and Zyprexa 10 mg p.o. daily at bedtime for mood stabilization -Nicorette gum 2 mg p.o. daily as needed for smoking cessation - NicoDerm CQ  21 mg, transdermal patch plus nicotine  cessation -Klonopin 0.5 mg p.o. daily for anxiety disorder  Other PRNs -Tylenol , Maalox, Mylanta, Atarax, melatonin -Agitation protocol medications   Recommendations  Based on my evaluation the patient does not appear to have an emergency medical condition.  Recommend inpatient psychiatric admission for stabilization and treatment.  Thurman LULLA Ivans, NP 08/15/24  2:33 AM

## 2024-08-14 NOTE — BH Assessment (Signed)
 Comprehensive Clinical Assessment (CCA) Note  08/14/2024 Manuel [name] [id number]  Disposition: Manuel Friday, NP recommends inpatient treatment. Manuel GILDING, RN to review for possible placement. CSW to seek placement if no available beds.   The patient demonstrates the following risk factors for suicide: Chronic risk factors for suicide include: psychiatric disorder of Major Depressive Disorder, recurrent, severe without psychotic features, Generalized Anxiety Disorder . Acute risk factors for suicide include: social withdrawal/isolation and Pt was suicidal is not denying suicidal ideations. Protective factors for this patient include: positive social support. Considering these factors, the overall suicide risk at this point appears to be low. Patient is not appropriate for outpatient follow up.  Manuel [name] is a 23 year old male who presents accompanied by his mother Manuel [name], [phone]) to [facility]). Clinician asked the pt, what brought you to the hospital? Pt reports, he had an ear infection when he was 23 years old, he listened to loud music on air pods everyday for six years and it turned into severe Tinnitus. Pt reports, he hears nonstop ringing in his ears. Pt reports, 2.5 months ago everything was normal but he said something dumb now he's stuck. Pt reports, I never said I was suicidal, I will not hurt/kill myself. Pt scratched his inside his elbow crease when anxious, clinician observed pt's inner elbow creases red from scratching. Pt denies, SI, HI, hallucinations and access to weapons.  Pt denies, substance use. Pt's UDS is positive for Marijuana. Pt is linked to Dr, Charlanne of medication management. Pt wants to stop taking his medications but was told by NP he can not abruptly stop taking benzodiazepines due to risk of withdrawal and possible seizures. Pt denies, previous inpatient admissions.   During the assessment pt  continued to stand to leave, pt expressed numerous times wanting to leave and blaming his mother for inpatient disposition. Clinician and NP redirected the pt, discussed his presenting symptoms and behaviors and why the recommendation was inpatient. Pt presents alert, anxious, restless, tearful at times in casual attire. Pt's mood was anxious, depressed, irritable. Pt's affect was congruent. Pt's insight was fair. Pt's judgement is poor.   *Per mother, the pt goes through this over and over again, he may have relief for an hour but it's getting worse and worse. Pt's mother reports, the pt's psychiatrist (Dr. Charlanne) recommended the pt come in. Pt's mother reports, Dr. Charlanne is worried about ruminations and auditory hallucinations for the pt. Pt's mother reports, they came to [facility] ED last Wednesday but left. Pt's mother reports, the pt can not function, he can't work or sleep; he feels his life is destroyed. Pt's mother reports, the pt says he can not live like this. Pt's mother is concerned for the pt.*  Chief Complaint:  Chief Complaint  Patient presents with   Medication Management   Visit Diagnosis: Major Depressive Disorder, recurrent, severe without psychotic features.                               Generalized Anxiety Disorder.    CCA Screening, Triage and Referral (STR)  Patient Reported Information How did you hear about us ? Family/Friend  What Is the Reason for Your Visit/Call Today? Manuel [name] 23y male presents to [facility] accompanied by his mother and girlfriend. PT asked for gf to be present with him in triage. PT stated he is diagnosed with OCD, prescribed medications, takes  as should. PT sees a psychiatrist. PT states he has severe tinnitus (at age 57, pt had bad ear infection and neglected it - blasting music too loud in earbuds, going to concerts, playing video games); pt thinks it is due to taking benzos and he is worried that he will go through withdrawals. When asked what  would help the most, pt states I don't think yall can help me. Pt's girlfriend (who is a Engineer, civil (consulting)) states she feels the pt is on too many medications and needs to be weaned off. PT states he hasn't gotten any rest in about 2 months. PT states this condition is completely destroyin his life causing depression and anxiety. PT endorses SI with no plan/intent. PT denies HI, AVH and alcohol and substance use.  How Long Has This Been Causing You Problems? 1-6 months  What Do You Feel Would Help You the Most Today? Treatment for Depression or other mood problem; Stress Management; Medication(s)   Have You Recently Had Any Thoughts About Hurting Yourself? Yes  Are You Planning to Commit Suicide/Harm Yourself At This time? No   Flowsheet Row ED from 08/14/2024 in [facility] ED from 08/08/2024 in [facility] Emergency Department at [facility] ED to Hosp-Admission (Discharged) from 01/07/2024 in [place] [facility]  C-SSRS RISK CATEGORY Low Risk No Risk No Risk    Have you Recently Had Thoughts About Hurting Someone Manuel [name]? No  Are You Planning to Harm Someone at This Time? No  Explanation: NA   Have You Used Any Alcohol or Drugs in the Past 24 Hours? No  How Long Ago Did You Use Drugs or Alcohol? Pt denies.  What Did You Use and How Much? Pt denies.   Do You Currently Have a Therapist/Psychiatrist? Yes  Name of Therapist/Psychiatrist: Name of Therapist/Psychiatrist: Dr. Charlanne, psychiatrist.   Have You Been Recently Discharged From Any Office Practice or Programs? No  Explanation of Discharge From Practice/Program: NA    CCA Screening Triage Referral Assessment Type of Contact: Face-to-Face  Telemedicine Service Delivery:   Is this Initial or Reassessment?   Date Telepsych consult ordered in [facility]:    Time Telepsych consult ordered in CHL:    Location of Assessment: [facility] Assessment Services  Provider Location: [facility] Assessment  Services   Collateral Involvement: Manuel [name], mother, [phone].   Does Patient Have a Automotive engineer Guardian? No  Legal Guardian Contact Information: NA  Copy of Legal Guardianship Form: -- (NA)  Legal Guardian Notified of Arrival: -- (NA)  Legal Guardian Notified of Pending Discharge: -- (NA)  If Minor and Not Living with Parent(s), Who has Custody? NA  Is CPS involved or ever been involved? Never  Is APS involved or ever been involved? Never   Patient Determined To Be At Risk for Harm To Self or Others Based on Review of Patient Reported Information or Presenting Complaint? Yes, for Self-Harm  Method: No Plan  Availability of Means: No access or NA  Intent: Vague intent or NA  Notification Required: No need or identified person  Additional Information for Danger to Others Potential: -- (NA)  Additional Comments for Danger to Others Potential: NA  Are There Guns or Other Weapons in Your Home? No  Types of Guns/Weapons: Pt denies.  Are These Weapons Safely Secured?                            -- (NA)  Who Could Verify You Are Able To Have These Secured: NA  Do You Have any Outstanding Charges, Pending Court Dates, Parole/Probation? Pt denies.  Contacted To Inform of Risk of Harm To Self or Others: Other: Comment (NA)    Does Patient Present under Involuntary Commitment? No    [place] of Residence: [place]   Patient Currently Receiving the Following Services: Medication Management   Determination of Need: Routine (7 days)   Options For Referral: Medication Management; BH Urgent Care; Inpatient Hospitalization     CCA Biopsychosocial Patient Reported Schizophrenia/Schizoaffective Diagnosis in Past: No   Strengths: Pt has strong supports.   Mental Health Symptoms Depression:  Difficulty Concentrating; Tearfulness; Fatigue; Hopelessness; Irritability; Sleep (too much or little); Increase/decrease in appetite (Agitated, feels  like he's in a daze, isolation.)   Duration of Depressive symptoms: Duration of Depressive Symptoms: Greater than two weeks   Mania:  None   Anxiety:   Worrying; Tension; Sleep; Restlessness; Fatigue; Difficulty concentrating (Agitated.)   Psychosis:  None   Duration of Psychotic symptoms:    Trauma:  None   Obsessions:  None   Compulsions:  None   Inattention:  None   Hyperactivity/Impulsivity:  Feeling of restlessness; Fidgets with hands/feet   Oppositional/Defiant Behaviors:  None   Emotional Irregularity:  None   Other Mood/Personality Symptoms:  NA    Mental Status Exam Appearance and self-care  Stature:  Tall   Weight:  Thin   Clothing:  Casual   Grooming:  Normal   Cosmetic use:  None   Posture/gait:  Tense   Motor activity:  Restless; Agitated   Sensorium  Attention:  Normal   Concentration:  Normal   Orientation:  X5   Recall/memory:  Normal   Affect and Mood  Affect:  Constricted   Mood:  Anxious; Depressed; Irritable   Relating  Eye contact:  Normal   Facial expression:  Anxious; Tense   Attitude toward examiner:  Cooperative; Irritable   Thought and Language  Speech flow: Normal   Thought content:  Appropriate to Mood and Circumstances   Preoccupation:  None   Hallucinations:  None   Organization:  Coherent   [employer] of Knowledge:  Fair   Intelligence:  Average   Abstraction:  Concrete   Judgement:  Poor   Reality Testing:  Adequate   Insight:  Fair   Decision Making:  Impulsive   Social Functioning  Social Maturity:  Isolates   Social Judgement:  Normal   Stress  Stressors:  Other (Comment) (severe Tinnitus.)   Coping Ability:  Overwhelmed   Skill Deficits:  Responsibility   Supports:  Family; Friends/Service system     Religion: Religion/Spirituality Are You A Religious Person?: No How Might This Affect Treatment?: NA  Leisure/Recreation: Leisure / Recreation Do You Have  Hobbies?: Yes Leisure and Hobbies: Gaming, watching TV, playing basketball, hanging out with is girlfriend.  Exercise/Diet: Exercise/Diet Do You Exercise?: Yes What Type of Exercise Do You Do?: Other (Comment) (Playing basketball.) How Many Times a Week Do You Exercise?:  (UTA) Have You Gained or Lost A Significant Amount of Weight in the Past Six Months?:  (UTA) Do You Follow a Special Diet?: No Do You Have Any Trouble Sleeping?: Yes Explanation of Sleeping Difficulties: Per mother, the pt has trouble sleeping.   CCA Employment/Education Employment/Work Situation: Employment / Work Situation Employment Situation: Unemployed Patient's Job has Been Impacted by Current Illness: No Has Patient ever Been in Equities trader?: No  Education:  Education Is Patient Currently Attending School?: No Last Grade Completed: 12 Did You Attend College?: No Did You Have An Individualized Education Program (IIEP): No Did You Have Any Difficulty At School?: No Patient's Education Has Been Impacted by Current Illness: No   CCA Family/Childhood History Family and Relationship History: Family history Marital status: Single Does patient have children?: No  Childhood History:  Childhood History By whom was/is the patient raised?: Both parents Did patient suffer any verbal/emotional/physical/sexual abuse as a child?: No Did patient suffer from severe childhood neglect?: No Has patient ever been sexually abused/assaulted/raped as an adolescent or adult?: No Was the patient ever a victim of a crime or a disaster?: No Witnessed domestic violence?: No Has patient been affected by domestic violence as an adult?: No   CCA Substance Use Alcohol/Drug Use: Alcohol / Drug Use Pain Medications: See MAR Prescriptions: See MAR Over the Counter: See MAR History of alcohol / drug use?: No history of alcohol / drug abuse Longest period of sobriety (when/how long): NA Negative Consequences of Use:   (NA) Withdrawal Symptoms: None    ASAM's:  Six Dimensions of Multidimensional Assessment  Dimension 1:  Acute Intoxication and/or Withdrawal Potential:      Dimension 2:  Biomedical Conditions and Complications:      Dimension 3:  Emotional, Behavioral, or Cognitive Conditions and Complications:     Dimension 4:  Readiness to Change:     Dimension 5:  Relapse, Continued use, or Continued Problem Potential:     Dimension 6:  Recovery/Living Environment:     ASAM Severity Score:    ASAM Recommended Level of Treatment:     Substance use Disorder (SUD)    Recommendations for Services/Supports/Treatments: Recommendations for Services/Supports/Treatments Recommendations For Services/Supports/Treatments: Inpatient Hospitalization  Disposition Recommendation per psychiatric provider: We recommend inpatient psychiatric hospitalization when medically cleared. Patient is under voluntary admission status at this time; please IVC if attempts to leave hospital.   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   Long-term use of adalimumab  04/25/2014   Crohn's colitis (HCC) 12/31/2012   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     Referrals to Alternative Service(s): Referred to Alternative Service(s):   Place:   Date:   Time:    Referred to Alternative Service(s):   Place:   Date:   Time:    Referred to Alternative Service(s):   Place:   Date:   Time:    Referred to Alternative Service(s):   Place:   Date:    Time:     Manuel [name], [facility] Comprehensive Clinical Assessment (CCA) Screening, Triage and Referral Note  08/14/2024 MANNIX KROEKER [id number]  Chief Complaint:  Chief Complaint  Patient presents with   Medication Management   Visit Diagnosis:   Patient Reported Information How did you hear about us ? Family/Friend  What Is the Reason for Your Visit/Call Today? Braxson Hollingsworth 23y male presents to [facility] accompanied by his mother and girlfriend. PT asked for gf to be present with him in triage. PT stated  he is diagnosed with OCD, prescribed medications, takes as should. PT sees a psychiatrist. PT states he has severe tinnitus (at age 39, pt had bad ear infection and neglected it - blasting music too loud in earbuds, going to concerts, playing video games); pt thinks it is due to taking benzos and he is worried that he will go through withdrawals. When asked what would help the most, pt states I don't think yall can help me. Pt's girlfriend (who is a Engineer, civil (consulting)) states she feels the pt is on too many medications and needs to be weaned off. PT states he hasn't gotten any rest in about 2 months. PT states this condition is completely destroyin his life causing depression and anxiety. PT endorses SI with no plan/intent. PT denies HI, AVH and alcohol and substance use.  How Long Has This Been Causing You Problems? 1-6 months  What Do You Feel Would Help You the Most Today? Treatment for Depression or other mood problem; Stress Management; Medication(s)   Have You Recently Had Any Thoughts About Hurting Yourself? Yes  Are You Planning to Commit Suicide/Harm Yourself At This time? No   Have you Recently Had Thoughts About Hurting Someone Manuel [name]? No  Are You Planning to Harm Someone at This Time? No  Explanation: NA   Have You Used Any Alcohol or Drugs in the Past 24 Hours? No  How Long Ago Did You Use Drugs or Alcohol? NA What Did You Use and How Much? NA  Do You Currently Have a  Therapist/Psychiatrist? Yes  Name of Therapist/Psychiatrist: Dr. Charlanne, psychiatrist.   Have You Been Recently Discharged From Any Office Practice or Programs? No  Explanation of Discharge From Practice/Program: NA   CCA Screening Triage Referral Assessment Type of Contact: Face-to-Face  Telemedicine Service Delivery:   Is this Initial or Reassessment?   Date Telepsych consult ordered in CHL:    Time Telepsych consult ordered in CHL:    Location of Assessment: [facility] Assessment Services  Provider Location: [facility] Assessment Services    Collateral Involvement: Manuel [name], mother, [phone].   Does Patient Have a Automotive engineer Guardian? No Name and Contact of Legal Guardian: NA If Minor and Not Living with Parent(s), Who has Custody? NA  Is CPS involved or ever been involved? Never  Is APS involved or ever been involved? Never   Patient Determined To Be At Risk for Harm To Self or Others Based on Review of Patient Reported Information or Presenting Complaint? Yes, for Self-Harm  Method: No Plan  Availability of Means: No access or NA  Intent: Vague intent or NA  Notification Required: No need or identified person  Additional Information for Danger to Others Potential: -- (NA)  Additional Comments for Danger to Others Potential: NA  Are There Guns or Other Weapons in Your Home? No  Types of Guns/Weapons: Pt denies.  Are These Weapons Safely Secured?                            -- (NA)  Who Could Verify You Are Able To Have These Secured: NA  Do You Have any Outstanding Charges, Pending Court Dates, Parole/Probation? Pt denies.  Contacted To Inform of Risk of Harm To Self or Others: Other: Comment (NA)   Does Patient Present under Involuntary Commitment? No    [place] of Residence: [place]   Patient Currently Receiving the Following Services: Medication Management   Determination of Need: Routine (  7 days)   Options For Referral:  Medication Management; BH Urgent Care; Inpatient Hospitalization   Disposition Recommendation per psychiatric provider: We recommend inpatient psychiatric hospitalization when medically cleared. Patient is under voluntary admission status at this time; please IVC if attempts to leave hospital.  Manuel [name], [facility]     Manuel JONETTA Broach, MS, [facility], [facility] Rehabilitation Triage Specialist [phone]
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
## 2024-08-15 LAB — TSH: TSH: 1.703 u[IU]/mL (ref 0.350–4.500)

## 2024-08-15 MED ORDER — CLONAZEPAM 0.25 MG PO TBDP: 0.5000 mg | ORAL_TABLET | Freq: Every day | ORAL | Status: DC

## 2024-08-15 NOTE — ED Notes (Signed)
 Patient asleep, appears calm. Respirations even and unlabored. No acute distress observed.  Resting in recliner asleep. Environment secured. Poc ongoing

## 2024-08-15 NOTE — Progress Notes (Signed)
 Patient is currently resting at this time.  No acute distress noted.  Respirations present and unlabored.  No current issues noted at this time.

## 2024-08-15 NOTE — ED Provider Notes (Signed)
 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   On morning assessment, patient was approached laying in the observation unit. Patient did report ongoing depression and anxiety related to severe tinnitus.  Patient reports symptoms have been ongoing chronically previously but have worsened extremely within the last 3 months.  He is arranged with tinnitus therapy on August 22, 2024 to help hopefully address symptoms.  Patient reports passive suicidal ideation the statements in the past, I would do anything to get rid of this ringing in my ears.  States at times he feels hopeless and that things will never change.  Denies any active suicidal ideations or plan.  Patient states that 3 months ago his life was great, his relationship was thriving he had good friends and a good career.  He notes frustrations with tinnitus and how its impacted his life. Patient also reports that he was previously started on Klonopin to help address anxiety, and is concerned about becoming dependent on medications. Discussed the option of inpatient treatment versus outpatient treatment.  Patient prefers outpatient treatment and following up with his primary psychiatrist Manuel [name] for further medication management.  Collateral  Information, Manuel [name],  Patient consented to speaking with mother without restrictions  States that patient has been experiencing worsening tinnitus for 2.5 months. Notes that he can't let it go and it is taking over his life. She states that he was previously taking Kratom and stopped taking abruptly and a new injection to manage his Crohn's disease. She notes chronic history of tinnitus that was mild however, symptoms worsened within the 2.5 months. She states that dhe would get constant messages about, life is hopeless. He was resistant to the meds due to concern that the tinnitus would make things worse, however she helps keep him compliant with medications regularly. She denies any history of previous suicidal thoughts or attempts. Notes previously he had a promising life, graduated from [employer], great friends. Dr. Leonardo office, were concerned for signs of schizophrenia. She notes that depression has caused him to lie in bed and even caused him to attempt to break up with current girlfriend  no one should have to put up with me.  Discussed patient's desire for continued outpatient management, and discharge home. She denied access to guns in the home, weapons or large stockpiles of pills.  She reports that she can come pick the patient up this afternoon for discharge.  Stay Summary:   Admission HP:  Patient was seen face-to-face by this provider and chart reviewed.  Patient gave permission for provider to speak with his mother and girlfriend during the evaluation.   On approach, patient presents as very anxious and restless.  He reports I have severe tinnitus, one of the worst cases and it is causing very severe anxiety.  I went to see my psychiatrist, they got me on new  medications, I was prescribed clonazepam for anxiety caused by the extreme tinnitus.Today, I scared my family and girlfriend so I came here to get checked out.   Patient's girlfriend reports he was also prescribed  olanzapine.  Patient reports he is unsure if the clonazepam 0.5 mg is effective as his symptoms had gotten worse since he has been taking it daily for 10 months. Patient reports he also takes olanzapine 5 mg in the morning and 10 mg at bedtime for his mood. Pt reports being unsure of his current medications and is requesting medication management/adjustments.    Patient's girlfriend reports that this afternoon, he expressed a lot of suicidal ideations and it has happened before.  She reports the patient is very restless, can't sit still because he was experiencing lots of ringing in his right ear.   Patient endorses suicidal ideations ongoing for almost 3 months.  He reports it has been worsening over time and blames his helplessness on his tinnitus..   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   Patient reports he is fixated on his tinnitus because of the loud ringing and he has seen an ENT doctor who diagnosed and told him there was no cure but his symptoms can be managed.   Patient reports he uses a nicotine  vape daily.   Patient's mother Manuel reports he was recently seen by Manuel [name] his psychiatrist who was worried the patient was experiencing ruminations, suicidal ideations and auditory hallucinations. Patient blames his psychiatric conditions on his tinnitus. Patient's mother reports he has stated loudly that he cannot function, cannot sleep, cannot walk, and has now become obsessed.   She reports the patient is suffering, stating his stress and anxiety has taken over his life and he has expressed suicidal thoughts and said he cannot live this way. She reports the patient might get brief relief for an hour, but then it gets worse and worse.  She reports they recently went  to the ED 08/08/24, due to increased anxiety, but LWBS after a long wait time.  She reports the patient has expressed to family and friends that the tinnitus and subsequent psychiatric conditions has destroyed his life, stating that his life was over and he cannot live this way and he was done.    On evaluation, patient is alert, oriented x 3, and cooperative. Speech is clear, and coherent. Pt appears casually dressed. Eye contact is good. Mood is anxious and depressed, affect is congruent with mood. Thought process is coherent and thought content is perseverative with rumination and obsession. Pt endorses SI, no plan, denies HI/AVH. There is no objective indication that the patient is responding to internal stimuli. No delusions elicited during this assessment.     Discussed recommendation for inpatient psychiatric admission for stabilization and treatment.  Discussed inpatient milieu and expectations.  Patient is provided with opportunity for questions.  He verbalized understanding and is in agreement.  Patient reports he sleeps with a white noise machine due to his Tinnitus.  If admitted inpatient at [facility], counselor and SW will work together to accommodate patient's needs.    Pt admitted to the continuous observation unit for safety monitoring pending transfer to an inpatient psychiatric unit. LCSW will seek bed placement.  Patient was eventually reassessed in the morning for  desire for inpatient treatment versus management of symptoms in the outpatient setting.  Patient deferred voluntary treatment and inpatient setting and desired continued outpatient follow-up with Manuel [name] to address symptoms and medication management. Patient did not meet criteria for involuntary commitment for inpatient psychiatric hospitalization.  Patient was discharged to mother with recommendation to continue to follow-up with outpatient provider about medication management and therapeutic services already arranged.  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  Currently has a girlfriend and no children.  Patient currently unemployed.  No history of trauma.  No access to guns. Tobacco Cessation:  N/A, patient does not currently use tobacco products  Current Medications:  Current Facility-Administered Medications  Medication Dose Route Frequency Provider Last Rate Last Admin   acetaminophen  (TYLENOL ) tablet 650 mg  650 mg Oral Q6H PRN Onuoha, Chinwendu V, NP       alum & mag hydroxide-simeth (MAALOX/MYLANTA) 200-200-20 MG/5ML suspension 30 mL  30 mL Oral Q4H PRN Onuoha, Chinwendu V, NP       clonazePAM (KLONOPIN) disintegrating tablet 0.5 mg  0.5 mg Oral Daily Lenard Calin, MD       haloperidol (HALDOL) tablet 5 mg  5 mg Oral TID PRN Onuoha, Chinwendu V, NP       And   diphenhydrAMINE (BENADRYL) capsule 50 mg  50 mg Oral TID PRN Onuoha, Chinwendu V, NP       haloperidol lactate (HALDOL) injection 5 mg  5 mg Intramuscular TID PRN Onuoha, Chinwendu V, NP       And   diphenhydrAMINE (BENADRYL) injection 50 mg  50 mg Intramuscular TID PRN Onuoha, Chinwendu V, NP       And   LORazepam (ATIVAN) injection 2 mg  2 mg Intramuscular TID PRN Onuoha, Chinwendu V, NP       haloperidol lactate (HALDOL) injection 10 mg  10 mg Intramuscular TID PRN Onuoha, Chinwendu V, NP       And   diphenhydrAMINE (BENADRYL) injection 50 mg  50 mg Intramuscular TID PRN Onuoha, Chinwendu V, NP       And   LORazepam (ATIVAN) injection 2 mg  2 mg Intramuscular  TID PRN Onuoha, Chinwendu V, NP       hydrOXYzine (ATARAX) tablet 25 mg  25 mg Oral TID PRN Onuoha, Chinwendu V, NP   25 mg at 08/14/24 2252   magnesium hydroxide (MILK OF MAGNESIA) suspension 30 mL  30 mL Oral Daily PRN Onuoha, Chinwendu V, NP       melatonin tablet 5 mg  5 mg Oral QHS PRN Onuoha, Chinwendu V, NP   5 mg at 08/14/24 2252   nicotine  (NICODERM CQ  - dosed in mg/24 hours) patch 21 mg  21 mg Transdermal Q0600 Onuoha, Chinwendu V, NP   21 mg at 08/15/24 0919   nicotine  polacrilex (NICORETTE) gum 2 mg  2 mg Oral Daily PRN Onuoha, Chinwendu V, NP   2 mg at 08/14/24 2326   OLANZapine zydis (ZYPREXA) disintegrating tablet 5 mg  5 mg Oral Daily Onuoha, Chinwendu V, NP   5 mg at 08/15/24 0916   And  OLANZapine zydis (ZYPREXA) disintegrating tablet 10 mg  10 mg Oral QHS Onuoha, Chinwendu V, NP   10 mg at 08/14/24 2252   Current Outpatient Medications  Medication Sig Dispense Refill   inFLIXimab-dyyb (INFLECTRA IV) Inject 370 mg into the vein See admin instructions. Inject into venous catheter.     Multiple Vitamins-Minerals (MULTIVITAMIN MEN PO) Take 1 Dose by mouth daily. Take 1 gummy by mouth daily.     OLANZapine (ZYPREXA) 10 MG tablet Take 10 mg by mouth at bedtime.     OLANZapine (ZYPREXA) 5 MG tablet Take 5 mg by mouth daily.     EPINEPHrine (ADRENALIN) 1 MG/ML SOLN injection Inject 0.3 mg into the muscle See admin instructions. Re-initiate treatment only upon physician approval (Patient not taking: Reported on 08/15/2024)      PTA Medications:  PTA Medications  Medication Sig   OLANZapine (ZYPREXA) 10 MG tablet Take 10 mg by mouth at bedtime.   OLANZapine (ZYPREXA) 5 MG tablet Take 5 mg by mouth daily.   inFLIXimab-dyyb (INFLECTRA IV) Inject 370 mg into the vein See admin instructions. Inject into venous catheter.   Multiple Vitamins-Minerals (MULTIVITAMIN MEN PO) Take 1 Dose by mouth daily. Take 1 gummy by mouth daily.   EPINEPHrine (ADRENALIN) 1 MG/ML SOLN injection Inject 0.3  mg into the muscle See admin instructions. Re-initiate treatment only upon physician approval (Patient not taking: Reported on 08/15/2024)   Facility Ordered Medications  Medication   acetaminophen  (TYLENOL ) tablet 650 mg   alum & mag hydroxide-simeth (MAALOX/MYLANTA) 200-200-20 MG/5ML suspension 30 mL   magnesium hydroxide (MILK OF MAGNESIA) suspension 30 mL   haloperidol (HALDOL) tablet 5 mg   And   diphenhydrAMINE (BENADRYL) capsule 50 mg   haloperidol lactate (HALDOL) injection 5 mg   And   diphenhydrAMINE (BENADRYL) injection 50 mg   And   LORazepam (ATIVAN) injection 2 mg   haloperidol lactate (HALDOL) injection 10 mg   And   diphenhydrAMINE (BENADRYL) injection 50 mg   And   LORazepam (ATIVAN) injection 2 mg   nicotine  (NICODERM CQ  - dosed in mg/24 hours) patch 21 mg   OLANZapine zydis (ZYPREXA) disintegrating tablet 5 mg   And   OLANZapine zydis (ZYPREXA) disintegrating tablet 10 mg   hydrOXYzine (ATARAX) tablet 25 mg   melatonin tablet 5 mg   nicotine  polacrilex (NICORETTE) gum 2 mg   clonazePAM (KLONOPIN) disintegrating tablet 0.5 mg       09/16/2020    9:46 AM  Depression screen PHQ 2/9  Decreased Interest 0  Down, Depressed, Hopeless 0  PHQ - 2 Score 0    Flowsheet Row ED from 08/14/2024 in [facility] ED from 08/08/2024 in [facility] at [facility] ED to Hosp-Admission (Discharged) from 01/07/2024 in [place] [facility]  C-SSRS RISK CATEGORY Low Risk No Risk No Risk    Musculoskeletal  Strength & Muscle Tone: within normal limits Gait & Station: normal Patient leans: N/A  Psychiatric Specialty Exam  Presentation  General Appearance:  Appropriate for Environment  Eye Contact: Fair  Speech: Clear and Coherent; Normal Rate  Speech Volume: Normal  Handedness: Right   Mood and Affect  Mood: Anxious; Depressed  Affect: Congruent   Thought Process  Thought  Processes: Linear; Coherent  Descriptions of Associations:Intact  Orientation:Full (Time, Place and Person)  Thought Content:Obsessions; Perseveration  Diagnosis of Schizophrenia or Schizoaffective disorder in past: No    Hallucinations:Hallucinations: None  Ideas of Reference:None  Suicidal Thoughts:Suicidal  Thoughts: No  Homicidal Thoughts:Homicidal Thoughts: No   Sensorium  Memory: Immediate Fair  Judgment: Fair  Insight: Fair   Chartered certified accountant: Fair  Attention Span: Fair  Recall: [employer] of Knowledge: Fair  Language: Fair   Psychomotor Activity  Psychomotor Activity: Psychomotor Activity: Normal   Assets  Assets: Manufacturing systems engineer; Desire for Improvement; Financial Resources/Insurance; Housing; Intimacy; Social Support; Vocational/Educational   Sleep  Sleep: Sleep: Fair  No Safety Checks orders active in given range  Nutritional Assessment (For OBS and [facility] admissions only) Has the patient had a weight loss or gain of 10 pounds or more in the last 3 months?: No Has the patient had a decrease in food intake/or appetite?: No Does the patient have dental problems?: No Does the patient have eating habits or behaviors that may be indicators of an eating disorder including binging or inducing vomiting?: No Has the patient recently lost weight without trying?: 0 Has the patient been eating poorly because of a decreased appetite?: 0 Malnutrition Screening Tool Score: 0    Physical Exam  Physical Exam Constitutional:      Appearance: Normal appearance.  Pulmonary:     Effort: Pulmonary effort is normal.    Review of Systems  Constitutional:  Negative for chills and fever.  HENT:  Positive for tinnitus.   Gastrointestinal:  Negative for nausea and vomiting.  Psychiatric/Behavioral:  Positive for depression. Negative for hallucinations, substance abuse and suicidal ideas. The patient is nervous/anxious.    Blood  pressure 110/78, pulse 64, temperature 98.2 F (36.8 C), temperature source Oral, resp. rate 18, SpO2 99%. There is no height or weight on file to calculate BMI.  Demographic Factors:  Male, Adolescent or young adult, Caucasian, Low socioeconomic status, and Unemployed  Loss Factors: Decrease in vocational status, Decline in physical health, and Financial problems/change in socioeconomic status  Historical Factors: NA  Risk Reduction Factors:   Sense of responsibility to family, Living with another person, especially a relative, and Positive social support  Continued Clinical Symptoms:  Moderate anxiety, related to medical conditions  Depression:   Hopelessness Obsessive-Compulsive Disorder More than one psychiatric diagnosis Previous Psychiatric Diagnoses and Treatments Medical Diagnoses and Treatments/Surgeries  Cognitive Features That Contribute To Risk:  None    Suicide Risk:  Mild:  Suicidal ideation of limited frequency, intensity, duration, and specificity.  There are no identifiable plans, no associated intent, mild dysphoria and related symptoms, good self-control (both objective and subjective assessment), few other risk factors, and identifiable protective factors, including available and accessible social support.  Plan Of Care/Follow-up recommendations:   -Follow-up with your outpatient psychiatric provider -instructions on appointment date, time, and address (location) are provided to you in discharge paperwork.  -Take your psychiatric medications as prescribed at discharge - instructions are provided to you in the discharge paperwork  -Follow-up with outpatient primary care doctor and other specialists -for management of preventative medicine and any chronic medical disease.  -Recommend abstinence from alcohol, tobacco, and other illicit drug use at discharge.   -If your psychiatric symptoms recur, worsen, or if you have side effects to your psychiatric  medications, call your outpatient psychiatric provider, 911, [phone] or go to the nearest emergency department.  -If suicidal thoughts occur, call your outpatient psychiatric provider, 911, 988 or go to the nearest emergency department.  Naloxone (Narcan) can help reverse an overdose when given to the victim quickly.  [facility] offers free naloxone kits and instructions/training on its use.  Add naloxone  to your first aid kit and you can help save a life.   Pick up your free kit at the following locations:   [place]:  [facility], [address] [place] [address] [phone]) [facility] [address] [place] [address] [phone]) [facility] [address] [place] [address]  [place]: [facility] [address] [place] [address] ([phone]) [facility] [address] [place] [address] [phone])  Disposition: Self care/ Home to mother   PATTI OLDEN, MD 08/15/2024, 3:04 PM

## 2024-08-15 NOTE — Discharge Instructions (Signed)
-  Follow-up with your outpatient psychiatric provider -instructions on appointment date, time, and address (location) are provided to you in discharge paperwork. Call and make an appointment.   -Take your psychiatric medications as prescribed at discharge - instructions are provided to you in the discharge paperwork  -Follow-up with outpatient primary care doctor and other specialists -for management of preventative medicine and any chronic medical disease.  -Recommend abstinence from alcohol, tobacco, and other illicit drug use at discharge.   -If your psychiatric symptoms recur, worsen, or if you have side effects to your psychiatric medications, call your outpatient psychiatric provider, 911, 988 or go to the nearest emergency department.  -If suicidal thoughts occur, call your outpatient psychiatric provider, 911, 988 or go to the nearest emergency department.  Naloxone (Narcan) can help reverse an overdose when given to the victim quickly.  [facility] offers free naloxone kits and instructions/training on its use.  Add naloxone to your first aid kit and you can help save a life.   Pick up your free kit at the following locations:   [place]:  [facility], [address] [place] [address] [phone]) [facility] [address] [place] [address] [phone]) [facility] [address] [place] [address]  [place]: [facility] [address] [place] [address] ([phone]) [facility] [address] [place] [address] [phone])

## 2024-08-15 NOTE — ED Notes (Signed)
 at the time of discharge, the patient was alert and oriented 3,No current suicidal or homicidal ideation, plan, or intent was reported.   Vital signs at discharge were stable and within normal limits. No signs of acute distress were noted. The patient was medically cleared for discharge.  Patient was discharged to home, picked up by mother. Discharge instructions were reviewed with patien  Patient verbalized understanding of discharge instructions and was provided a copy. All questions were addressed prior to discharge. The patient left the facility in improved condition.

## 2024-08-27 ENCOUNTER — Ambulatory Visit (HOSPITAL_COMMUNITY)
Admission: EM | Admit: 2024-08-27 | Discharge: 2024-08-29 | Disposition: A | Discharge status: Other Acute Inpatient Hospital | Attending: Psychiatry | Admitting: Psychiatry

## 2024-08-27 DIAGNOSIS — K50119 Crohn's disease of large intestine with unspecified complications: Secondary | ICD-10-CM

## 2024-08-27 DIAGNOSIS — F331 Major depressive disorder, recurrent, moderate: Secondary | ICD-10-CM | POA: Insufficient documentation

## 2024-08-27 DIAGNOSIS — F17290 Nicotine dependence, other tobacco product, uncomplicated: Secondary | ICD-10-CM | POA: Diagnosis not present

## 2024-08-27 DIAGNOSIS — K5010 Crohn's disease of large intestine without complications: Secondary | ICD-10-CM | POA: Insufficient documentation

## 2024-08-27 DIAGNOSIS — F410 Panic disorder [episodic paroxysmal anxiety] without agoraphobia: Secondary | ICD-10-CM | POA: Diagnosis not present

## 2024-08-27 DIAGNOSIS — Z79899 Other long term (current) drug therapy: Secondary | ICD-10-CM | POA: Insufficient documentation

## 2024-08-27 DIAGNOSIS — Z720 Tobacco use: Secondary | ICD-10-CM

## 2024-08-27 DIAGNOSIS — F1994 Other psychoactive substance use, unspecified with psychoactive substance-induced mood disorder: Secondary | ICD-10-CM | POA: Diagnosis not present

## 2024-08-27 DIAGNOSIS — F419 Anxiety disorder, unspecified: Secondary | ICD-10-CM | POA: Insufficient documentation

## 2024-08-27 DIAGNOSIS — F1114 Opioid abuse with opioid-induced mood disorder: Secondary | ICD-10-CM | POA: Insufficient documentation

## 2024-08-27 DIAGNOSIS — F1190 Opioid use, unspecified, uncomplicated: Secondary | ICD-10-CM | POA: Diagnosis present

## 2024-08-27 LAB — COMPREHENSIVE METABOLIC PANEL WITH GFR
ALT: 17 U/L (ref 0–44)
AST: 22 U/L (ref 15–41)
Albumin: 4 g/dL (ref 3.5–5.0)
Alkaline Phosphatase: 63 U/L (ref 38–126)
Anion gap: 14 (ref 5–15)
BUN: 13 mg/dL (ref 6–20)
CO2: 26 mmol/L (ref 22–32)
Calcium: 9.2 mg/dL (ref 8.9–10.3)
Chloride: 102 mmol/L (ref 98–111)
Creatinine, Ser: 1.12 mg/dL (ref 0.61–1.24)
GFR, Estimated: >60 mL/min (ref >60)
Glucose, Bld: 63 mg/dL — ABNORMAL LOW (ref 70–99)
Potassium: 4.2 mmol/L (ref 3.5–5.1)
Sodium: 142 mmol/L (ref 135–145)
Total Bilirubin: 0.9 mg/dL (ref 0.0–1.2)
Total Protein: 7.2 g/dL (ref 6.5–8.1)

## 2024-08-27 LAB — CBC WITH DIFFERENTIAL/PLATELET
Abs Immature Granulocytes: 0.01 K/uL (ref 0.00–0.07)
Basophils Absolute: 0 K/uL (ref 0.0–0.1)
Basophils Relative: 0 %
Eosinophils Absolute: 0.1 K/uL (ref 0.0–0.5)
Eosinophils Relative: 2 %
HCT: 43.2 % (ref 39.0–52.0)
Hemoglobin: 14.4 g/dL (ref 13.0–17.0)
Immature Granulocytes: 0 %
Lymphocytes Relative: 25 %
Lymphs Abs: 1.5 K/uL (ref 0.7–4.0)
MCH: 29.2 pg (ref 26.0–34.0)
MCHC: 33.3 g/dL (ref 30.0–36.0)
MCV: 87.6 fL (ref 80.0–100.0)
Monocytes Absolute: 0.5 K/uL (ref 0.1–1.0)
Monocytes Relative: 9 %
Neutro Abs: 3.7 K/uL (ref 1.7–7.7)
Neutrophils Relative %: 64 %
Platelets: 188 K/uL (ref 150–400)
RBC: 4.93 MIL/uL (ref 4.22–5.81)
RDW: 12.2 % (ref 11.5–15.5)
WBC: 5.7 K/uL (ref 4.0–10.5)
nRBC: 0 % (ref 0.0–0.2)

## 2024-08-27 LAB — MAGNESIUM: Magnesium: 2.3 mg/dL (ref 1.7–2.4)

## 2024-08-27 LAB — ETHANOL: Alcohol, Ethyl (B): <15 mg/dL (ref <15)

## 2024-08-27 MED ORDER — LORAZEPAM 2 MG/ML IJ SOLN: 2.0000 mg | Freq: Three times a day (TID) | INTRAMUSCULAR | Status: DC | PRN

## 2024-08-27 MED ORDER — DIPHENHYDRAMINE HCL 50 MG PO CAPS: 50.0000 mg | ORAL_CAPSULE | Freq: Three times a day (TID) | ORAL | Status: DC | PRN

## 2024-08-27 MED ORDER — HALOPERIDOL LACTATE 5 MG/ML IJ SOLN: 10.0000 mg | Freq: Three times a day (TID) | INTRAMUSCULAR | Status: DC | PRN

## 2024-08-27 MED ORDER — NAPROXEN 500 MG PO TABS: 500.0000 mg | ORAL_TABLET | Freq: Two times a day (BID) | ORAL | Status: DC | PRN

## 2024-08-27 MED ORDER — ACETAMINOPHEN 325 MG PO TABS: 650.0000 mg | ORAL_TABLET | Freq: Four times a day (QID) | ORAL | Status: DC | PRN

## 2024-08-27 MED ORDER — ALUM & MAG HYDROXIDE-SIMETH 200-200-20 MG/5ML PO SUSP: 30.0000 mL | ORAL | Status: DC | PRN

## 2024-08-27 MED ORDER — MAGNESIUM HYDROXIDE 400 MG/5ML PO SUSP: 30.0000 mL | Freq: Every day | ORAL | Status: DC | PRN

## 2024-08-27 MED ORDER — CLONIDINE HCL 0.1 MG PO TABS
0.1000 mg | ORAL_TABLET | Freq: Four times a day (QID) | ORAL | Status: DC
  Administered 2024-08-27 – 2024-08-28 (×4): 0.1 mg via ORAL
  Filled 2024-08-27 (×5): qty 1

## 2024-08-27 MED ORDER — HYDROXYZINE HCL 25 MG PO TABS
25.0000 mg | ORAL_TABLET | Freq: Three times a day (TID) | ORAL | Status: DC | PRN
  Administered 2024-08-27 – 2024-08-28 (×2): 25 mg via ORAL
  Filled 2024-08-27 (×2): qty 1

## 2024-08-27 MED ORDER — CLONIDINE HCL 0.1 MG PO TABS: 0.1000 mg | ORAL_TABLET | ORAL | Status: DC

## 2024-08-27 MED ORDER — DIPHENHYDRAMINE HCL 50 MG/ML IJ SOLN: 50.0000 mg | Freq: Three times a day (TID) | INTRAMUSCULAR | Status: DC | PRN

## 2024-08-27 MED ORDER — ONDANSETRON 4 MG PO TBDP: 4.0000 mg | ORAL_TABLET | Freq: Four times a day (QID) | ORAL | Status: DC | PRN

## 2024-08-27 MED ORDER — CLONIDINE HCL 0.1 MG PO TABS: 0.1000 mg | ORAL_TABLET | Freq: Every day | ORAL | Status: DC

## 2024-08-27 MED ORDER — LOPERAMIDE HCL 2 MG PO CAPS: 2.0000 mg | ORAL_CAPSULE | ORAL | Status: DC | PRN

## 2024-08-27 MED ORDER — HALOPERIDOL LACTATE 5 MG/ML IJ SOLN: 5.0000 mg | Freq: Three times a day (TID) | INTRAMUSCULAR | Status: DC | PRN

## 2024-08-27 MED ORDER — NICOTINE POLACRILEX 2 MG MT GUM
2.0000 mg | CHEWING_GUM | OROMUCOSAL | Status: DC | PRN
Start: 2024-08-27 — End: 2024-08-29
  Administered 2024-08-27 – 2024-08-29 (×4): 2 mg via ORAL
  Filled 2024-08-27 (×4): qty 1

## 2024-08-27 MED ORDER — DICYCLOMINE HCL 20 MG PO TABS
20.0000 mg | ORAL_TABLET | Freq: Four times a day (QID) | ORAL | Status: DC | PRN
Start: 2024-08-27 — End: 2024-09-01

## 2024-08-27 MED ORDER — CLONAZEPAM 0.5 MG PO TABS
0.5000 mg | ORAL_TABLET | Freq: Every day | ORAL | Status: DC
  Filled 2024-08-27: qty 1

## 2024-08-27 MED ORDER — TRAZODONE HCL 50 MG PO TABS: 50.0000 mg | ORAL_TABLET | Freq: Every evening | ORAL | Status: DC | PRN

## 2024-08-27 MED ORDER — METHOCARBAMOL 500 MG PO TABS: 500.0000 mg | ORAL_TABLET | Freq: Three times a day (TID) | ORAL | Status: DC | PRN

## 2024-08-27 MED ORDER — HALOPERIDOL 5 MG PO TABS: 5.0000 mg | ORAL_TABLET | Freq: Three times a day (TID) | ORAL | Status: DC | PRN

## 2024-08-27 NOTE — BH Assessment (Signed)
 Comprehensive Clinical Assessment (CCA) Note  08/27/2024 Manuel [name] [id number]  Chief Complaint:  Chief Complaint  Patient presents with   Addiction Problem   Detox  Disposition: Per Ismael Franco, MD patient is recommended for admission to the observation unit until a bed becomes available in the Facility Based Crisis unit.  The patient demonstrates the following risk factors for suicide: Chronic risk factors for suicide include: psychiatric disorder of OCD, depression, anxiety and substance use disorder. Acute risk factors for suicide include: family or marital conflict, unemployment, and social withdrawal/isolation. Protective factors for this patient include: hope for the future. Considering these factors, the overall suicide risk at this point appears to be low. Patient is not appropriate for outpatient follow up.   Manuel [name] is a 23 year old male with a history of OCD, depression and anxiety who presents voluntarily to [facility] for an assessment. Patient reports he is seeking detox from Kratom. He reports he has been using Kratom for the past 8 days straight, unknown amount. Patient reports from 2019- June 2025 he would only use this substance a few days a month. He reports he decided to quit in July 2025 and was eventually prescribed Klonopin to help with detox, but relapsed 8 days ago.Patient resides in the home with his parents and 28 year old sister. He identifies his parents, ex-girlfriend as his primary support system.Patient reports isolation, irritability, hopelessness, guilt, loss of interest to do things they enjoy, fatigue, lack of concentration, worthlessness, insomnia, and decreased appetite. Patient reports SI but denies any clear plans or intent to end his life at this time. Patient denies history of past suicide attempts. Patient denies any other substance use outside of nicotine  vaping. Patient denies NSSIB,HI, and AVH.  Patient identifies his  primary stressors as ongoing substance use,medical concerns (severe tinnitus) relationship issues and feeling like he has ruined his life.Patient denies history of abuse or trauma. Patient denies current legal problems. Patient is receiving outpatient psychiatry services, with Dr.Gupta at [facility]. Patient denies outpatient therapy services at this time.Patient reports he  takes his medications as prescribed (see MAR) and denies recent medication changes. Patient denies previous inpatient admissions for mental health. Patient denies access to weapons.   Treatment options were discussed and patient is in agreement with recommendation for admission to [facility].   During evaluation patient is in no acute distress. He is alert, oriented x 4, anxious, restless but cooperative and attentive. His mood is anxious and depressed with congruent affect. His speech is normal. Patient is able to converse coherently, goal directed thoughts, but does appear to be fixated on his treatment and specific medications that he must have for his detox.He denies suicidal/self-harm/homicidal ideation, psychosis, and paranoia.  Patient answered question appropriately.      Visit Diagnosis:  Opioid abuse with opioid-induced mood disorder (HCC) Substance induced mood disorder (HCC)     CCA Screening, Triage and Referral (STR)  Patient Reported Information How did you hear about us ? Family/Friend  What Is the Reason for Your Visit/Call Today? Per triage note Pt presents to [facility] voluntarily and accompanied with his mother, Manuel [name]. Pt reports SI with a plan to overdose on Kratum. Pt reports withdrawls from Kratum. Pt denies HI, or AVH. Pt admits prior MH diagnosis; also,, reports taking prescribed medication symptom.  How Long Has This Been Causing You Problems? 1 wk - 1 month  What Do You Feel Would Help You the Most Today? Alcohol or Drug Use Treatment;  Treatment for Depression or other mood problem;  Medication(s)   Have You Recently Had Any Thoughts About Hurting Yourself? Yes  Are You Planning to Commit Suicide/Harm Yourself At This time? No   Flowsheet Row ED from 08/27/2024 in [facility] ED from 08/14/2024 in [facility] ED from 08/08/2024 in [facility] Emergency Department at [facility]  C-SSRS RISK CATEGORY Low Risk Low Risk No Risk    Have you Recently Had Thoughts About Hurting Someone [name]? No  Are You Planning to Harm Someone at This Time? No  Explanation: Pt denies   Have You Used Any Alcohol or Drugs in the Past 24 Hours? Yes  How Long Ago Did You Use Drugs or Alcohol? In the past 24 hours What Did You Use and How Much? Kratom- unknown amount   Do You Currently Have a Therapist/Psychiatrist? Yes  Name of Therapist/Psychiatrist: Name of Therapist/Psychiatrist: Dr.Gupta at [facility]   Have You Been Recently Discharged From Any Office Practice or Programs? No  Explanation of Discharge From Practice/Program: n/a    CCA Screening Triage Referral Assessment Type of Contact: Face-to-Face  Telemedicine Service Delivery:   Is this Initial or Reassessment?   Date Telepsych consult ordered in [facility]:    Time Telepsych consult ordered in [facility]:    Location of Assessment: [facility] Services  Provider Location: [facility] Assessment Services   Collateral Involvement: Manuel [name], mother, [phone].   Does Patient Have a Automotive Engineer Guardian? No  Legal Guardian Contact Information: n/a  Copy of Legal Guardianship Form: -- (n/a)  Legal Guardian Notified of Arrival: -- (n/a)  Legal Guardian Notified of Pending Discharge: -- (n/a)  If Minor and Not Living with Parent(s), Who has Custody? n/a  Is CPS involved or ever been involved? Never  Is APS involved or ever been involved? Never   Patient Determined To Be At Risk for Harm To Self or Others Based on Review of  Patient Reported Information or Presenting Complaint? Yes, for Self-Harm  Method: No Plan  Availability of Means: No access or NA  Intent: Vague intent or NA  Notification Required: No need or identified person  Additional Information for Danger to Others Potential: -- (n/a)  Additional Comments for Danger to Others Potential: NA  Are There Guns or Other Weapons in Your Home? No  Types of Guns/Weapons: Pt denies.  Are These Weapons Safely Secured?                            -- (n/a)  Who Could Verify You Are Able To Have These Secured: NA  Do You Have any Outstanding Charges, Pending Court Dates, Parole/Probation? Pt denies  Contacted To Inform of Risk of Harm To Self or Others: Other: Comment (n/a)    Does Patient Present under Involuntary Commitment? No    [place] of Residence: [place]   Patient Currently Receiving the Following Services: Medication Management   Determination of Need: Urgent (48 hours)   Options For Referral: Facility-Based Crisis; [facility]; Outpatient Therapy; Medication Management     CCA Biopsychosocial Patient Reported Schizophrenia/Schizoaffective Diagnosis in Past: No   Strengths: Pt has strong supports.   Mental Health Symptoms Depression:  Difficulty Concentrating; Tearfulness; Fatigue; Hopelessness; Irritability; Sleep (too much or little); Increase/decrease in appetite; Worthlessness; Change in energy/activity (isolation)   Duration of Depressive symptoms: Duration of Depressive Symptoms: Greater than two weeks   Mania:  N/A   Anxiety:   Worrying; Tension; Sleep; Restlessness; Fatigue; Difficulty concentrating   Psychosis:  None   Duration of Psychotic symptoms:    Trauma:  N/A   Obsessions:  N/A   Compulsions:  N/A   Inattention:  N/A   Hyperactivity/Impulsivity:  Feeling of restlessness; Fidgets with hands/feet   Oppositional/Defiant Behaviors:  N/A   Emotional Irregularity:  N/A   Other  Mood/Personality Symptoms:  n/a    Mental Status Exam Appearance and self-care  Stature:  Tall   Weight:  Thin   Clothing:  Casual   Grooming:  Normal   Cosmetic use:  None   Posture/gait:  Tense   Motor activity:  Restless; Agitated   Sensorium  Attention:  Normal   Concentration:  Normal   Orientation:  X5   Recall/memory:  Normal   Affect and Mood  Affect:  Anxious   Mood:  Anxious; Depressed   Relating  Eye contact:  Fleeting   Facial expression:  Anxious; Tense   Attitude toward examiner:  Cooperative   Thought and Language  Speech flow: Normal   Thought content:  Appropriate to Mood and Circumstances   Preoccupation:  None   Hallucinations:  None   Organization:  Coherent   [employer] of Knowledge:  Fair   Intelligence:  Average   Abstraction:  Concrete   Judgement:  Poor   Reality Testing:  Adequate   Insight:  Fair   Decision Making:  Impulsive   Social Functioning  Social Maturity:  Isolates   Social Judgement:  Normal   Stress  Stressors:  Other (Comment); Illness; Relationship; Transitions (Severe tinnitus)   Coping Ability:  Overwhelmed; Exhausted   Skill Deficits:  Responsibility; Self-care   Supports:  Family; Friends/Service system     Religion: Religion/Spirituality Are You A Religious Person?: No How Might This Affect Treatment?: NA  Leisure/Recreation: Leisure / Recreation Do You Have Hobbies?: Yes Leisure and Hobbies: Gaming, watching TV, playing basketball, golf  Exercise/Diet: Exercise/Diet Do You Exercise?: Yes What Type of Exercise Do You Do?: Other (Comment) (basketball, golf) How Many Times a Week Do You Exercise?: 1-3 times a week Have You Gained or Lost A Significant Amount of Weight in the Past Six Months?: No Do You Follow a Special Diet?: No Do You Have Any Trouble Sleeping?: Yes Explanation of Sleeping Difficulties: Insomnia   CCA Employment/Education Employment/Work  Situation: Employment / Work Situation Employment Situation: Unemployed Patient's Job has Been Impacted by Current Illness: No Has Patient ever Been in Equities Trader?: No  Education: Education Is Patient Currently Attending School?: No Last Grade Completed: 12 Did You Product Manager?: Yes What Type of College Degree Do you Have?: Bachelors Degree in Engineering per his report Did You Have An Individualized Education Program (IIEP): No Did You Have Any Difficulty At [employer]?: No Patient's Education Has Been Impacted by Current Illness: No   CCA Family/Childhood History Family and Relationship History: Family history Marital status: Single Does patient have children?: No  Childhood History:  Childhood History By whom was/is the patient raised?: Both parents Did patient suffer any verbal/emotional/physical/sexual abuse as a child?: No Did patient suffer from severe childhood neglect?: No Has patient ever been sexually abused/assaulted/raped as an adolescent or adult?: No Was the patient ever a victim of a crime or a disaster?: No Witnessed domestic violence?: No Has patient been affected by domestic violence as an adult?: No       CCA Substance Use Alcohol/Drug Use: Alcohol /  Drug Use Pain Medications: See MAR Prescriptions: See MAR Over the Counter: See MAR History of alcohol / drug use?: Yes Longest period of sobriety (when/how long): NA Negative Consequences of Use:  (NA) Withdrawal Symptoms: None Substance #1 Name of Substance 1: Kratom 1 - Age of First Use: unknown 1 - Amount (size/oz): unknown 1 - Frequency: daily 1 - Duration: ongoing 1 - Last Use / Amount: past 24 hours 1 - Method of Aquiring: unknown 1- Route of Use: oral consumption in pill form                       ASAM's:  Six Dimensions of Multidimensional Assessment  Dimension 1:  Acute Intoxication and/or Withdrawal Potential:      Dimension 2:  Biomedical Conditions and  Complications:      Dimension 3:  Emotional, Behavioral, or Cognitive Conditions and Complications:     Dimension 4:  Readiness to Change:     Dimension 5:  Relapse, Continued use, or Continued Problem Potential:     Dimension 6:  Recovery/Living Environment:     ASAM Severity Score:    ASAM Recommended Level of Treatment:     Substance use Disorder (SUD)    Recommendations for Services/Supports/Treatments: Recommendations for Services/Supports/Treatments Recommendations For Services/Supports/Treatments: Facility Based Crisis, Detox  Disposition Recommendation per psychiatric provider: Per Chrystal Franco, MD patient is recommended for admission to the observation unit until a bed becomes available in the Facility Based Crisis unit.   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

## 2024-08-27 NOTE — ED Notes (Signed)
 Pt given PB&J sandwich w/ cranberry juice.

## 2024-08-27 NOTE — ED Provider Notes (Signed)
 [facility] Continuous Assessment Admission H&P  Date: 08/27/24 Patient Name: Manuel [name] MRN: [id number] Chief Complaint: detox from Kratom  Diagnoses:  Final diagnoses:  Opioid abuse with opioid-induced mood disorder (HCC)  Substance induced mood disorder (HCC)    HPI: Manuel [name] is a 23 year old male with a past psychiatric history of panic attacks, depression, and kratom use who presents to the [facility] with his mother and best friend.  Patient was evaluated alone.  When asked what brought the patient to the facility, he states that he relapsed on kratom 8 days ago and has been using 2-3 tablets of kratom 50 mg every day.  He is worried of the withdrawal effects of kratom and so he is looking for medications specifically clonidine to help him through the potential withdrawal.  At this time, he is experiencing high anxiety.  He also states that since he has been using kratom, he has been having the worst tinnitus that a 23 year old can have.    He also reports having suicidal ideation for the past week.  He states that he currently denies suicidal ideation at this time and he is contracting for safety.  He states that he started kratom in 2019 and he initially was using it a few times a month until June 2025 when he was using it to the amount and frequency of how he is currently using it until July 2025 when he decided to quit.  He stated that he quit cold turkey which was very uncomfortable and that is why he was prescribed Klonopin.  He does not want to be on Klonopin but he now feels addicted to taking it every night.  Per PDMP, he does get prescribed Klonopin and is filling the prescription appropriately.  He currently denies HI and AVH.   I discussed that he would be appropriate for the facility based crisis for detox along with daily assessment with a psychiatrist and assessment with the social worker regarding next steps of resources.  I discussed admission to our observation  unit as currently the facility based crisis is at capacity but once there is availability can transfer him over and he is in agreement.  Discussed that we will start clonidine taper to aid with opioid withdrawal along with COWS monitoring from his kratom use.  Discussed that we will restart his nightly Klonopin.  I spoke with patient's mother and patient's best friend.  They agree with the plan of patient detoxing in the facility with closer monitoring.  I answered questions regarding the disposition plan.   Total Time spent with patient: 45 minutes  Musculoskeletal  Strength & Muscle Tone: within normal limits Gait & Station: normal Patient leans: N/A  Psychiatric Specialty Exam  Presentation General Appearance:  Appropriate for Environment; Fairly Groomed; Casual  Eye Contact: Fair  Speech: Pressured  Speech Volume: Normal  Handedness: Right   Mood and Affect  Mood: Depressed; Anxious  Affect: Congruent   Thought Process  Thought Processes: Goal Directed; Coherent  Descriptions of Associations:Intact  Orientation:Full (Time, Place and Person)  Thought Content:Logical; Rumination  Diagnosis of Schizophrenia or Schizoaffective disorder in past: No   Hallucinations:Hallucinations: None  Ideas of Reference:None  Suicidal Thoughts:Suicidal Thoughts: No  Homicidal Thoughts:Homicidal Thoughts: No   Sensorium  Memory: Remote Good  Judgment: Impaired  Insight: Lacking   Executive Functions  Concentration: Good  Attention Span: Good  Recall: Good  Fund of Knowledge: Good  Language: Good   Psychomotor Activity  Psychomotor Activity: Psychomotor Activity:  Normal   Assets  Assets: Manufacturing Systems Engineer; Resilience   Sleep  Sleep: Sleep: Fair   Nutritional Assessment (For OBS and FBC admissions only) Has the patient had a weight loss or gain of 10 pounds or more in the last 3 months?: No Has the patient had a decrease in food  intake/or appetite?: No Does the patient have dental problems?: No Does the patient have eating habits or behaviors that may be indicators of an eating disorder including binging or inducing vomiting?: No Has the patient recently lost weight without trying?: 0 Has the patient been eating poorly because of a decreased appetite?: 0 Malnutrition Screening Tool Score: 0    Physical Exam Review of Systems  Constitutional:  Negative for fever.  HENT:  Positive for tinnitus.   Gastrointestinal:  Negative for nausea.  Neurological:  Negative for headaches.  Psychiatric/Behavioral:  Positive for substance abuse. The patient is nervous/anxious.     Blood pressure 112/68, pulse 86, temperature 98.3 F (36.8 C), temperature source Oral, resp. rate 18, SpO2 100%. There is no height or weight on file to calculate BMI.  Past Psychiatric History:  Patient sees Dr. Charlanne for medication management and is currently on Klonopin 0.5 mg nightly. Per chart review, no prior inpatient psychiatric hospitalizations.  Is the patient at risk to self? Yes  Has the patient been a risk to self in the past 6 months? Yes .    Has the patient been a risk to self within the distant past? No   Is the patient a risk to others? No   Has the patient been a risk to others in the past 6 months? No   Has the patient been a risk to others within the distant past? No   Past Medical History: Crohn's disease  Family History: None reported  Social History: Currently lives with parents, unemployed, support system include his mother, father, and ex girlfriend, denies legal history, denies access to firearms  Last Labs:  Admission on 08/14/2024, Discharged on 08/15/2024  Component Date Value Ref Range Status   WBC 08/14/2024 6.7  4.0 - 10.5 K/uL Final   RBC 08/14/2024 4.88  4.22 - 5.81 MIL/uL Final   Hemoglobin 08/14/2024 14.3  13.0 - 17.0 g/dL Final   HCT 89/78/7974 42.5  39.0 - 52.0 % Final   MCV 08/14/2024 87.1  80.0 -  100.0 fL Final   MCH 08/14/2024 29.3  26.0 - 34.0 pg Final   MCHC 08/14/2024 33.6  30.0 - 36.0 g/dL Final   RDW 89/78/7974 12.5  11.5 - 15.5 % Final   Platelets 08/14/2024 210  150 - 400 K/uL Final   nRBC 08/14/2024 0.0  0.0 - 0.2 % Final   Neutrophils Relative % 08/14/2024 68  % Final   Neutro Abs 08/14/2024 4.5  1.7 - 7.7 K/uL Final   Lymphocytes Relative 08/14/2024 20  % Final   Lymphs Abs 08/14/2024 1.4  0.7 - 4.0 K/uL Final   Monocytes Relative 08/14/2024 10  % Final   Monocytes Absolute 08/14/2024 0.7  0.1 - 1.0 K/uL Final   Eosinophils Relative 08/14/2024 1  % Final   Eosinophils Absolute 08/14/2024 0.1  0.0 - 0.5 K/uL Final   Basophils Relative 08/14/2024 1  % Final   Basophils Absolute 08/14/2024 0.0  0.0 - 0.1 K/uL Final   Immature Granulocytes 08/14/2024 0  % Final   Abs Immature Granulocytes 08/14/2024 0.01  0.00 - 0.07 K/uL Final   Performed at [facility],  [address], [place], [place] [address]   Sodium 08/14/2024 139  135 - 145 mmol/L Final   Potassium 08/14/2024 3.4 (L)  3.5 - 5.1 mmol/L Final   Chloride 08/14/2024 103  98 - 111 mmol/L Final   CO2 08/14/2024 26  22 - 32 mmol/L Final   Glucose, Bld 08/14/2024 89  70 - 99 mg/dL Final   Glucose reference range applies only to samples taken after fasting for at least 8 hours.   BUN 08/14/2024 10  6 - 20 mg/dL Final   Creatinine, Ser 08/14/2024 1.01  0.61 - 1.24 mg/dL Final   Calcium 89/78/7974 8.9  8.9 - 10.3 mg/dL Final   Total Protein 89/78/7974 7.0  6.5 - 8.1 g/dL Final   Albumin 89/78/7974 4.2  3.5 - 5.0 g/dL Final   AST 89/78/7974 18  15 - 41 U/L Final   ALT 08/14/2024 13  0 - 44 U/L Final   Alkaline Phosphatase 08/14/2024 73  38 - 126 U/L Final   Total Bilirubin 08/14/2024 0.8  0.0 - 1.2 mg/dL Final   GFR, Estimated 08/14/2024 >60  >60 mL/min Final   Comment: (NOTE) Calculated using the CKD-EPI Creatinine Equation (2021)    Anion gap 08/14/2024 10  5 - 15 Final   Performed at [facility]  Lab, [address], [place], [place] [address]   Hgb A1c MFr Bld 08/14/2024 4.7 (L)  4.8 - 5.6 % Final   Comment: (NOTE) Diagnosis of Diabetes The following HbA1c ranges recommended by the American Diabetes Association (ADA) may be used as an aid in the diagnosis of diabetes mellitus.  Hemoglobin             Suggested A1C NGSP%              Diagnosis  <5.7                   Non Diabetic  5.7-6.4                Pre-Diabetic  >6.4                   Diabetic  <7.0                   Glycemic control for                       adults with diabetes.     Mean Plasma Glucose 08/14/2024 88.19  mg/dL Final   Performed at [facility], [address], [place], [place] [address]   Cholesterol 08/14/2024 136  0 - 200 mg/dL Final   Triglycerides 89/78/7974 115  <150 mg/dL Final   HDL 89/78/7974 57  >40 mg/dL Final   Total CHOL/HDL Ratio 08/14/2024 2.4  RATIO Final   VLDL 08/14/2024 23  0 - 40 mg/dL Final   LDL Cholesterol 08/14/2024 56  0 - 99 mg/dL Final   Comment:        Total Cholesterol/HDL:CHD Risk Coronary Heart Disease Risk Table                     Men   Women  1/2 Average Risk   3.4   3.3  Average Risk       5.0   4.4  2 X Average Risk   9.6   7.1  3 X Average Risk  23.4   11.0  Use the calculated Patient Ratio above and the CHD Risk Table to determine the patient's CHD Risk.        ATP III CLASSIFICATION (LDL):  <100     mg/dL   Optimal  899-870  mg/dL   Near or Above                    Optimal  130-159  mg/dL   Borderline  839-810  mg/dL   High  >809     mg/dL   Very High Performed at [facility], [address] [place], [place] [address]    TSH 08/14/2024 1.703  0.350 - 4.500 uIU/mL Final   Comment: Performed by a 3rd Generation assay with a functional sensitivity of <=0.01 uIU/mL. Performed at [facility], [address] [place], [place] [address]    POC Amphetamine UR 08/14/2024 None Detected  NONE DETECTED (Cut Off Level 1000 ng/mL)  Corrected   POC Secobarbital (BAR) 08/14/2024 None Detected  NONE DETECTED (Cut Off Level 300 ng/mL) Corrected   POC Buprenorphine (BUP) 08/14/2024 None Detected  NONE DETECTED (Cut Off Level 10 ng/mL) Corrected   POC Oxazepam (BZO) 08/14/2024 None Detected  NONE DETECTED (Cut Off Level 300 ng/mL) Corrected   POC Cocaine UR 08/14/2024 None Detected  NONE DETECTED (Cut Off Level 300 ng/mL) Corrected   POC Methamphetamine UR 08/14/2024 None Detected  NONE DETECTED (Cut Off Level 1000 ng/mL) Corrected   POC Morphine  08/14/2024 None Detected  NONE DETECTED (Cut Off Level 300 ng/mL) Corrected   POC Methadone UR 08/14/2024 None Detected  NONE DETECTED (Cut Off Level 300 ng/mL) Corrected   POC Oxycodone  UR 08/14/2024 None Detected  NONE DETECTED (Cut Off Level 100 ng/mL) Corrected   POC Marijuana UR 08/14/2024 Positive (A)  NONE DETECTED (Cut Off Level 50 ng/mL) Corrected    Allergies: Patient has no known allergies.  Medications:  Facility Ordered Medications  Medication   acetaminophen  (TYLENOL ) tablet 650 mg   alum & mag hydroxide-simeth (MAALOX/MYLANTA) 200-200-20 MG/5ML suspension 30 mL   magnesium hydroxide (MILK OF MAGNESIA) suspension 30 mL   haloperidol (HALDOL) tablet 5 mg   And   diphenhydrAMINE (BENADRYL) capsule 50 mg   haloperidol lactate (HALDOL) injection 5 mg   And   diphenhydrAMINE (BENADRYL) injection 50 mg   And   LORazepam (ATIVAN) injection 2 mg   haloperidol lactate (HALDOL) injection 10 mg   And   diphenhydrAMINE (BENADRYL) injection 50 mg   And   LORazepam (ATIVAN) injection 2 mg   hydrOXYzine (ATARAX) tablet 25 mg   traZODone  (DESYREL ) tablet 50 mg   dicyclomine (BENTYL) tablet 20 mg   loperamide (IMODIUM) capsule 2-4 mg   methocarbamol  (ROBAXIN ) tablet 500 mg   naproxen (NAPROSYN) tablet 500 mg   ondansetron  (ZOFRAN -ODT) disintegrating tablet 4 mg   cloNIDine (CATAPRES) tablet 0.1 mg   Followed by   NOREEN ON 08/30/2024] cloNIDine (CATAPRES) tablet 0.1 mg    Followed by   NOREEN ON 09/02/2024] cloNIDine (CATAPRES) tablet 0.1 mg   clonazePAM (KLONOPIN) tablet 0.5 mg   PTA Medications  Medication Sig   EPINEPHrine (ADRENALIN) 1 MG/ML SOLN injection Inject 0.3 mg into the muscle See admin instructions. Re-initiate treatment only upon physician approval (Patient not taking: Reported on 08/15/2024)   OLANZapine (ZYPREXA) 10 MG tablet Take 10 mg by mouth at bedtime.   OLANZapine (ZYPREXA) 5 MG tablet Take 5 mg by mouth daily.   inFLIXimab-dyyb (INFLECTRA IV) Inject 370 mg  into the vein See admin instructions. Inject into venous catheter.   Multiple Vitamins-Minerals (MULTIVITAMIN MEN PO) Take 1 Dose by mouth daily. Take 1 gummy by mouth daily.    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  Dispo: observation unit -> [facility] when available   Ismael KATHEE Franco, MD 08/27/24  8:56 PM
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
## 2024-08-27 NOTE — ED Notes (Signed)
 Pt states he was unable to do UDS at this time upon admitting.  He has the cup for sample on the unit.

## 2024-08-28 LAB — POCT URINE DRUG SCREEN - MANUAL ENTRY (I-SCREEN)
POC Amphetamine UR: NOT DETECTED
POC Buprenorphine (BUP): NOT DETECTED
POC Cocaine UR: NOT DETECTED
POC Marijuana UR: POSITIVE — AB
POC Methadone UR: NOT DETECTED
POC Methamphetamine UR: NOT DETECTED
POC Morphine: NOT DETECTED
POC Oxazepam (BZO): NOT DETECTED
POC Oxycodone UR: NOT DETECTED
POC Secobarbital (BAR): NOT DETECTED

## 2024-08-28 MED ORDER — CLONAZEPAM 0.5 MG PO TABS: 0.5000 mg | ORAL_TABLET | Freq: Every day | ORAL | Status: DC

## 2024-08-28 MED ORDER — CLONAZEPAM 0.5 MG PO TABS
0.5000 mg | ORAL_TABLET | Freq: Every day | ORAL | Status: DC
  Administered 2024-08-28: 0.5 mg via ORAL
  Filled 2024-08-28: qty 1

## 2024-08-28 NOTE — ED Notes (Signed)
 Patient ate lunch and remains cooperative in unit. Patient in no current distress.

## 2024-08-28 NOTE — ED Provider Notes (Cosign Needed Addendum)
 Facility Based Crisis Admission H&P  Date: 08/28/24 Patient Name: Manuel [name] MRN: [id number] Chief Complaint: I want to get out of this acute phase  Diagnoses:  Final diagnoses:  Opioid abuse with opioid-induced mood disorder [facility])  Substance induced mood disorder (HCC)  Crohn's colitis, unspecified complication (HCC)    HPI: Manuel [name], is seen face to face by this provider, consulted with Dr. Cole; and chart reviewed on 08/28/24.  On evaluation [name] reports he is currently experiencing withdrawal symptoms from Kratom. He describes his withdrawal symptoms as increased anxiety, which he currently endorses, and no other symptoms reported.  He states he has been using approximately 15 mg daily, taken orally, with his last use occurring yesterday. He has been using consecutively for the past eight days and reports sobriety prior to that period. He denies suicidal ideation, homicidal ideation, auditory hallucinations, and visual hallucinations. However, he has been ruminating on how his tinnitus is negatively impacting his life, stating that it contributed to the loss of his job, the end of his relationship with his girlfriend, and the loss of trust from his family. He reports having a provider but states that there has been no solution to the issue. He reports that his sleep is adequate and his appetite is good. His anxiety has worsened over the past three days, coinciding with an increase in tinnitus symptoms. He has a psychiatric history of depression and anxiety and receives care from [facility], although he has not taken his prescribed medications in some time.  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 He has one sister. His medical history includes a diagnosis of Crohn's disease- constipation. He takes Inflectra inj every two months, with the last administration occurring in September, and is not due at this time.   He reports experiencing tinnitus for several years, which he attributes to prolonged exposure to high-pitched music over the past nine years, primarily affecting his right ear. When asked about long-term rehabilitation, he stated he is unsure at this time but wants to get through the acute phase of withdrawal, which he believes typically lasts about three days. The patient was admitted yesterday.   During evaluation [name] is laying down in no acute distress.  He is alert & oriented x 4, calm, cooperative and attentive for this assessment.  His mood is anxious with congruent affect.  He has normal speech, and behavior.  Objectively there is no evidence of psychosis/mania or delusional thinking. Pt does not appear to be responding to internal or external stimuli.  Patient is able to converse coherently, goal directed thoughts, no distractibility, or pre-occupation.  He also denies suicidal/self-harm/homicidal ideation, psychosis, and paranoia.  Patient answered question appropriately.    PHQ 2-9:   Flowsheet Row ED from 08/27/2024 in [facility] ED from 08/14/2024 in [facility] ED from 08/08/2024 in [facility] Emergency Department at [facility]  C-SSRS RISK CATEGORY Low Risk Low Risk No Risk    Screenings    Flowsheet Row Most Recent Value  COWS Total Score 2    Total Time spent with patient: 45 minutes  Musculoskeletal  Strength & Muscle Tone: within normal limits Gait & Station: normal Patient leans: N/A  Psychiatric Specialty Exam  Presentation General Appearance:  Fairly Groomed  Eye Contact: Good  Speech: Clear and Coherent  Speech Volume: Normal  Handedness: Right   Mood and Affect  Mood: Anxious  Affect: Congruent   Thought Process  Thought Processes: Coherent  Descriptions of Associations:Intact  Orientation:Full (Time, Place and Person)  Thought Content:Rumination  Diagnosis  of Schizophrenia or Schizoaffective disorder in past: No   Hallucinations:Hallucinations: None  Ideas of Reference:None  Suicidal Thoughts:Suicidal Thoughts: No  Homicidal Thoughts:Homicidal Thoughts: No   Sensorium  Memory: Immediate Fair  Judgment: Fair  Insight: Fair   Art Therapist  Concentration: Good  Attention Span: Good  Recall: Good  Fund of Knowledge: Fair  Language: Fair   Psychomotor Activity  Psychomotor Activity: Psychomotor Activity: Normal   Assets  Assets: Communication Skills; Housing   Sleep  Sleep: Sleep: Fair   Nutritional Assessment (For OBS and FBC admissions only) Has the patient had a weight loss or gain of 10 pounds or more in the last 3 months?: No Has the patient had a decrease in food intake/or appetite?: No Does the patient have dental problems?: No Does the patient have eating habits or behaviors that may be indicators of an eating disorder including binging or inducing vomiting?: No Has the patient recently lost weight without trying?: 0 Has the patient been eating poorly because of a decreased appetite?: 0 Malnutrition Screening Tool Score: 0    Physical Exam HENT:     Head: Normocephalic and atraumatic.     Nose: Nose normal.     Mouth/Throat:     Pharynx: Oropharynx is clear.  Cardiovascular:     Rate and Rhythm: Normal rate.  Pulmonary:     Effort: Pulmonary effort is normal.  Musculoskeletal:        General: Normal range of motion.     Cervical back: Normal range of motion.  Neurological:     Mental Status: He is alert and oriented to person, place, and time.  Psychiatric:        Attention and Perception: Attention and perception normal.        Mood and Affect: Mood is anxious.        Speech: Speech normal.        Behavior: Behavior is agitated. Behavior is cooperative.        Thought Content: Thought content normal.        Cognition and Memory: Cognition normal.        Judgment: Judgment  normal.    Review of Systems  Psychiatric/Behavioral:  Positive for substance abuse. The patient is nervous/anxious.   All other systems reviewed and are negative.   Blood pressure 112/63, pulse 73, temperature 98.2 F (36.8 C), temperature source Oral, resp. rate 16, SpO2 99%. There is no height or weight on file to calculate BMI.  Past Psychiatric History: Depression, anxiety   Is the patient at risk to self? No  Has the patient been a risk to self in the past 6 months? No .    Has the patient been a risk to self within the distant past? No   Is the patient a risk to others? No   Has the patient been a risk to others in the past 6 months? No   Has the patient been a risk to others within the distant past? No   Past Medical History: Tinnitus; Crohn's disease - constipation type. Family History: Non-reported Social History: Lives at home with his family. 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  Last Labs:  Admission on 08/27/2024  Component Date Value Ref Range Status   WBC 08/27/2024 5.7  4.0 - 10.5 K/uL Final  RBC 08/27/2024 4.93  4.22 - 5.81 MIL/uL Final   Hemoglobin 08/27/2024 14.4  13.0 - 17.0 g/dL Final   HCT 88/96/7974 43.2  39.0 - 52.0 % Final   MCV 08/27/2024 87.6  80.0 - 100.0 fL Final   MCH 08/27/2024 29.2  26.0 - 34.0 pg Final   MCHC 08/27/2024 33.3  30.0 - 36.0 g/dL Final   RDW 88/96/7974 12.2  11.5 - 15.5 % Final   Platelets 08/27/2024 188  150 - 400 K/uL Final   nRBC 08/27/2024 0.0  0.0 - 0.2 % Final   Neutrophils Relative % 08/27/2024 64  % Final   Neutro Abs 08/27/2024 3.7  1.7 - 7.7 K/uL Final   Lymphocytes Relative 08/27/2024 25  % Final   Lymphs Abs 08/27/2024 1.5  0.7 - 4.0 K/uL Final   Monocytes Relative 08/27/2024 9  % Final   Monocytes Absolute 08/27/2024 0.5  0.1 - 1.0 K/uL Final   Eosinophils Relative 08/27/2024 2  % Final   Eosinophils Absolute 08/27/2024 0.1  0.0 - 0.5 K/uL Final   Basophils Relative 08/27/2024 0  %  Final   Basophils Absolute 08/27/2024 0.0  0.0 - 0.1 K/uL Final   Immature Granulocytes 08/27/2024 0  % Final   Abs Immature Granulocytes 08/27/2024 0.01  0.00 - 0.07 K/uL Final   Performed at [facility] Lab, [address], [place], [place] [address]   Sodium 08/27/2024 142  135 - 145 mmol/L Final   Potassium 08/27/2024 4.2  3.5 - 5.1 mmol/L Final   Chloride 08/27/2024 102  98 - 111 mmol/L Final   CO2 08/27/2024 26  22 - 32 mmol/L Final   Glucose, Bld 08/27/2024 63 (L)  70 - 99 mg/dL Final   Glucose reference range applies only to samples taken after fasting for at least 8 hours.   BUN 08/27/2024 13  6 - 20 mg/dL Final   Creatinine, Ser 08/27/2024 1.12  0.61 - 1.24 mg/dL Final   Calcium 88/96/7974 9.2  8.9 - 10.3 mg/dL Final   Total Protein 88/96/7974 7.2  6.5 - 8.1 g/dL Final   Albumin 88/96/7974 4.0  3.5 - 5.0 g/dL Final   AST 88/96/7974 22  15 - 41 U/L Final   ALT 08/27/2024 17  0 - 44 U/L Final   Alkaline Phosphatase 08/27/2024 63  38 - 126 U/L Final   Total Bilirubin 08/27/2024 0.9  0.0 - 1.2 mg/dL Final   GFR, Estimated 08/27/2024 >60  >60 mL/min Final   Comment: (NOTE) Calculated using the CKD-EPI Creatinine Equation (2021)    Anion gap 08/27/2024 14  5 - 15 Final   Performed at [facility] Lab, [address], [place], [place] [address]   Magnesium 08/27/2024 2.3  1.7 - 2.4 mg/dL Final   Performed at [facility] Lab, [address], [place], [place] [address]   Alcohol, Ethyl (B) 08/27/2024 <15  <15 mg/dL Final   Comment: (NOTE) For medical purposes only. Performed at [facility] Lab, [address], [place], [place] [address]    POC Amphetamine UR 08/28/2024 None Detected  NONE DETECTED (Cut Off Level 1000 ng/mL) Final   POC Secobarbital (BAR) 08/28/2024 None Detected  NONE DETECTED (Cut Off Level 300 ng/mL) Final   POC Buprenorphine (BUP) 08/28/2024 None Detected  NONE DETECTED (Cut Off Level 10 ng/mL) Final   POC Oxazepam (BZO) 08/28/2024 None Detected   NONE DETECTED (Cut Off Level 300 ng/mL) Final   POC Cocaine UR 08/28/2024  None Detected  NONE DETECTED (Cut Off Level 300 ng/mL) Final   POC Methamphetamine UR 08/28/2024 None Detected  NONE DETECTED (Cut Off Level 1000 ng/mL) Final   POC Morphine  08/28/2024 None Detected  NONE DETECTED (Cut Off Level 300 ng/mL) Final   POC Methadone UR 08/28/2024 None Detected  NONE DETECTED (Cut Off Level 300 ng/mL) Final   POC Oxycodone  UR 08/28/2024 None Detected  NONE DETECTED (Cut Off Level 100 ng/mL) Final   POC Marijuana UR 08/28/2024 Positive (A)  NONE DETECTED (Cut Off Level 50 ng/mL) Final  Admission on 08/14/2024, Discharged on 08/15/2024  Component Date Value Ref Range Status   WBC 08/14/2024 6.7  4.0 - 10.5 K/uL Final   RBC 08/14/2024 4.88  4.22 - 5.81 MIL/uL Final   Hemoglobin 08/14/2024 14.3  13.0 - 17.0 g/dL Final   HCT 89/78/7974 42.5  39.0 - 52.0 % Final   MCV 08/14/2024 87.1  80.0 - 100.0 fL Final   MCH 08/14/2024 29.3  26.0 - 34.0 pg Final   MCHC 08/14/2024 33.6  30.0 - 36.0 g/dL Final   RDW 89/78/7974 12.5  11.5 - 15.5 % Final   Platelets 08/14/2024 210  150 - 400 K/uL Final   nRBC 08/14/2024 0.0  0.0 - 0.2 % Final   Neutrophils Relative % 08/14/2024 68  % Final   Neutro Abs 08/14/2024 4.5  1.7 - 7.7 K/uL Final   Lymphocytes Relative 08/14/2024 20  % Final   Lymphs Abs 08/14/2024 1.4  0.7 - 4.0 K/uL Final   Monocytes Relative 08/14/2024 10  % Final   Monocytes Absolute 08/14/2024 0.7  0.1 - 1.0 K/uL Final   Eosinophils Relative 08/14/2024 1  % Final   Eosinophils Absolute 08/14/2024 0.1  0.0 - 0.5 K/uL Final   Basophils Relative 08/14/2024 1  % Final   Basophils Absolute 08/14/2024 0.0  0.0 - 0.1 K/uL Final   Immature Granulocytes 08/14/2024 0  % Final   Abs Immature Granulocytes 08/14/2024 0.01  0.00 - 0.07 K/uL Final   Performed at [facility], [address] [place], [place] [address]   Sodium 08/14/2024 139  135 - 145 mmol/L Final   Potassium 08/14/2024 3.4 (L)  3.5  - 5.1 mmol/L Final   Chloride 08/14/2024 103  98 - 111 mmol/L Final   CO2 08/14/2024 26  22 - 32 mmol/L Final   Glucose, Bld 08/14/2024 89  70 - 99 mg/dL Final   Glucose reference range applies only to samples taken after fasting for at least 8 hours.   BUN 08/14/2024 10  6 - 20 mg/dL Final   Creatinine, Ser 08/14/2024 1.01  0.61 - 1.24 mg/dL Final   Calcium 89/78/7974 8.9  8.9 - 10.3 mg/dL Final   Total Protein 89/78/7974 7.0  6.5 - 8.1 g/dL Final   Albumin 89/78/7974 4.2  3.5 - 5.0 g/dL Final   AST 89/78/7974 18  15 - 41 U/L Final   ALT 08/14/2024 13  0 - 44 U/L Final   Alkaline Phosphatase 08/14/2024 73  38 - 126 U/L Final   Total Bilirubin 08/14/2024 0.8  0.0 - 1.2 mg/dL Final   GFR, Estimated 08/14/2024 >60  >60 mL/min Final   Comment: (NOTE) Calculated using the CKD-EPI Creatinine Equation (2021)    Anion gap 08/14/2024 10  5 - 15 Final   Performed at [facility] Lab, [address], [place], [place] [address]   Hgb A1c MFr Bld 08/14/2024 4.7 (L)  4.8 -  5.6 % Final   Comment: (NOTE) Diagnosis of Diabetes The following HbA1c ranges recommended by the American Diabetes Association (ADA) may be used as an aid in the diagnosis of diabetes mellitus.  Hemoglobin             Suggested A1C NGSP%              Diagnosis  <5.7                   Non Diabetic  5.7-6.4                Pre-Diabetic  >6.4                   Diabetic  <7.0                   Glycemic control for                       adults with diabetes.     Mean Plasma Glucose 08/14/2024 88.19  mg/dL Final   Performed at [facility] Lab, [address], [place], [place] [address]   Cholesterol 08/14/2024 136  0 - 200 mg/dL Final   Triglycerides 89/78/7974 115  <150 mg/dL Final   HDL 89/78/7974 57  >40 mg/dL Final   Total CHOL/HDL Ratio 08/14/2024 2.4  RATIO Final   VLDL 08/14/2024 23  0 - 40 mg/dL Final   LDL Cholesterol 08/14/2024 56  0 - 99 mg/dL Final   Comment:        Total Cholesterol/HDL:CHD  Risk Coronary Heart Disease Risk Table                     Men   Women  1/2 Average Risk   3.4   3.3  Average Risk       5.0   4.4  2 X Average Risk   9.6   7.1  3 X Average Risk  23.4   11.0        Use the calculated Patient Ratio above and the CHD Risk Table to determine the patient's CHD Risk.        ATP III CLASSIFICATION (LDL):  <100     mg/dL   Optimal  899-870  mg/dL   Near or Above                    Optimal  130-159  mg/dL   Borderline  839-810  mg/dL   High  >809     mg/dL   Very High Performed at [facility] Lab, [address], [place], [place] [address]    TSH 08/14/2024 1.703  0.350 - 4.500 uIU/mL Final   Comment: Performed by a 3rd Generation assay with a functional sensitivity of <=0.01 uIU/mL. Performed at [facility] Lab, [address], [place], [place] [address]    POC Amphetamine UR 08/14/2024 None Detected  NONE DETECTED (Cut Off Level 1000 ng/mL) Corrected   POC Secobarbital (BAR) 08/14/2024 None Detected  NONE DETECTED (Cut Off Level 300 ng/mL) Corrected   POC Buprenorphine (BUP) 08/14/2024 None Detected  NONE DETECTED (Cut Off Level 10 ng/mL) Corrected   POC Oxazepam (BZO) 08/14/2024 None Detected  NONE DETECTED (Cut Off Level 300 ng/mL) Corrected   POC Cocaine UR 08/14/2024 None Detected  NONE DETECTED (Cut Off Level 300 ng/mL) Corrected   POC Methamphetamine UR 08/14/2024 None Detected  NONE DETECTED (Cut  Off Level 1000 ng/mL) Corrected   POC Morphine  08/14/2024 None Detected  NONE DETECTED (Cut Off Level 300 ng/mL) Corrected   POC Methadone UR 08/14/2024 None Detected  NONE DETECTED (Cut Off Level 300 ng/mL) Corrected   POC Oxycodone  UR 08/14/2024 None Detected  NONE DETECTED (Cut Off Level 100 ng/mL) Corrected   POC Marijuana UR 08/14/2024 Positive (A)  NONE DETECTED (Cut Off Level 50 ng/mL) Corrected    Allergies: Patient has no known allergies.  Medications:  Facility Ordered Medications  Medication   acetaminophen  (TYLENOL ) tablet 650 mg    alum & mag hydroxide-simeth (MAALOX/MYLANTA) 200-200-20 MG/5ML suspension 30 mL   magnesium hydroxide (MILK OF MAGNESIA) suspension 30 mL   haloperidol (HALDOL) tablet 5 mg   And   diphenhydrAMINE (BENADRYL) capsule 50 mg   haloperidol lactate (HALDOL) injection 5 mg   And   diphenhydrAMINE (BENADRYL) injection 50 mg   And   LORazepam (ATIVAN) injection 2 mg   haloperidol lactate (HALDOL) injection 10 mg   And   diphenhydrAMINE (BENADRYL) injection 50 mg   And   LORazepam (ATIVAN) injection 2 mg   hydrOXYzine (ATARAX) tablet 25 mg   traZODone  (DESYREL ) tablet 50 mg   dicyclomine (BENTYL) tablet 20 mg   loperamide (IMODIUM) capsule 2-4 mg   methocarbamol  (ROBAXIN ) tablet 500 mg   naproxen (NAPROSYN) tablet 500 mg   ondansetron  (ZOFRAN -ODT) disintegrating tablet 4 mg   cloNIDine (CATAPRES) tablet 0.1 mg   Followed by   NOREEN ON 08/30/2024] cloNIDine (CATAPRES) tablet 0.1 mg   Followed by   NOREEN ON 09/02/2024] cloNIDine (CATAPRES) tablet 0.1 mg   clonazePAM (KLONOPIN) tablet 0.5 mg   nicotine  polacrilex (NICORETTE) gum 2 mg   PTA Medications  Medication Sig   inFLIXimab-dyyb (INFLECTRA IV) Inject 370 mg into the vein See admin instructions. Inject into venous catheter.   Multiple Vitamins-Minerals (MULTIVITAMIN MEN PO) Take 1 Dose by mouth daily. Take 1 gummy by mouth daily.   gabapentin (NEURONTIN) 300 MG capsule Take 300 mg by mouth daily.   cloNIDine (CATAPRES) 0.1 MG tablet Take 0.1 mg by mouth every 8 (eight) hours as needed.   clonazePAM (KLONOPIN) 0.5 MG tablet Take 0.5 mg by mouth daily.   Acetylcysteine (NAC PO) Take 1 tablet by mouth daily.   EPINEPHrine (ADRENALIN) 1 MG/ML SOLN injection Inject 0.3 mg into the muscle See admin instructions. Re-initiate treatment only upon physician approval (Patient not taking: Reported on 08/15/2024)   OLANZapine (ZYPREXA) 10 MG tablet Take 10 mg by mouth at bedtime. (Patient not taking: Reported on 08/28/2024)   OLANZapine  (ZYPREXA) 5 MG tablet Take 5 mg by mouth daily. (Patient not taking: Reported on 08/28/2024)   fluvoxaMINE (LUVOX) 25 MG tablet Take 25 mg by mouth daily. (Patient not taking: Reported on 08/28/2024)   SPRAVATO, 84 MG DOSE, 28 MG/DEVICE SOPK Place 3 sprays into both nostrils See admin instructions. Frequency unspecified (Patient not taking: Reported on 08/28/2024)   Buprenorphine HCl-Naloxone HCl 8-2 MG FILM Place 1 Film under the tongue in the morning and at bedtime. (Patient not taking: Reported on 08/28/2024)   Risankizumab-rzaa (SKYRIZI PEN [place]) Inject 1 Dose into the skin See admin instructions. Frequency unknown (Patient not taking: Reported on 08/28/2024)   PREDNISONE  PO Take 1 Dose by mouth See admin instructions. Frequency unspecified (Patient not taking: Reported on 08/28/2024)   ONDANSETRON  PO Take 1 tablet by mouth See admin instructions. Frequency and dose unspecified (Patient not taking: Reported on 08/28/2024)    Long  Term Goals: Improvement in symptoms so as ready for discharge  Short Term Goals: Patient will verbalize feelings in meetings with treatment team members., Patient will attend at least of 50% of the groups daily., Pt will complete the PHQ9 on admission, day 3 and discharge., and Patient will take medications as prescribed daily.  Medical Decision Making  Pt admitted to continuous observation yesterday, to be admitted to [facility] when bed becomes available.    Labs already obtained: -CBC w/ differential/platelet -CMP -Ethanol -Hemoglobin A1c -TSH   -EKG 12-Lead    Medications Started yesterday: -Tylenol , 650mg , oral, every 6 hours PRN, mild pain -Maalox/Mylanta 30mL, oral, every 4 hours PRN, indigestion -Atarax 25mg , oral, 3 times daily PRN, anxiety -Milk of Magnesia, 30 mL, oral, daily PRN, mild constipation -Desyrel , 50mg , oral, at bedtime PRN, sleep -Naproxen 500 mg BID as needed for pain -Bentyl 20 mg every 6 hours as needed for spasms/abdominal cramping -Robaxin   500 mg every 8 hours as needed for muscle spasms -Zofran  4 mg every 6 hours as needed for nausea or vomiting -Imodium 2 to 4 mg as needed for diarrhea or loose stools  Protocol/Meds started yesterday COWS assessment Clonidine taper   Klonopin 0.5 mg nightly   Recommendations  Based on my evaluation the patient does not appear to have an emergency medical condition.  Tosin Filimon Miranda, NP 08/28/24  2:10 PM

## 2024-08-28 NOTE — Progress Notes (Signed)
 Patient presents slightly anxious but denies any major s/s of withdrawal. Clonidine held per provider this morning due to low BP. Patients COW score 2. Patient denies SI,HI, and A/V/H with no plan or intent. Patient ate breakfast and remains cooperative in unit.

## 2024-08-28 NOTE — ED Notes (Signed)
 Pt A&O x 4, resting at present, no distress noted.  Watching a movie at present.  Monitoring for safety.

## 2024-08-28 NOTE — ED Notes (Signed)
 Pt in no acute distress, sleeping. Respirations even & unlabored. Q15 min safety checks continued.

## 2024-08-28 NOTE — ED Notes (Signed)
 Patient upset due to having to wait for klonopin at bedtime. Provider contacted and medication time adjusted. Patient received medication and received nicotine  gum earlier today prn. Patient remains overall cooperative in unit.

## 2024-08-28 NOTE — ED Notes (Signed)
 Pt sleeping at present, no distress noted.  Monitoring for safety.

## 2024-08-28 NOTE — ED Notes (Signed)
 Patient was provided breakfast

## 2024-08-29 ENCOUNTER — Encounter (HOSPITAL_COMMUNITY): Payer: Self-pay

## 2024-08-29 ENCOUNTER — Inpatient Hospital Stay (HOSPITAL_COMMUNITY)
Admission: AD | Admit: 2024-08-29 | Discharge: 2024-09-03 | DRG: 885 | Disposition: A | Discharge status: Home / Self Care | Source: Intra-hospital | Attending: Student in an Organized Health Care Education/Training Program | Admitting: Student in an Organized Health Care Education/Training Program

## 2024-08-29 ENCOUNTER — Other Ambulatory Visit: Payer: Self-pay

## 2024-08-29 DIAGNOSIS — F429 Obsessive-compulsive disorder, unspecified: Principal | ICD-10-CM | POA: Diagnosis present

## 2024-08-29 DIAGNOSIS — H905 Unspecified sensorineural hearing loss: Secondary | ICD-10-CM | POA: Diagnosis present

## 2024-08-29 DIAGNOSIS — H9311 Tinnitus, right ear: Secondary | ICD-10-CM | POA: Diagnosis present

## 2024-08-29 DIAGNOSIS — F17290 Nicotine dependence, other tobacco product, uncomplicated: Secondary | ICD-10-CM | POA: Diagnosis present

## 2024-08-29 DIAGNOSIS — F1990 Other psychoactive substance use, unspecified, uncomplicated: Secondary | ICD-10-CM | POA: Diagnosis not present

## 2024-08-29 DIAGNOSIS — F331 Major depressive disorder, recurrent, moderate: Principal | ICD-10-CM | POA: Diagnosis present

## 2024-08-29 DIAGNOSIS — Z818 Family history of other mental and behavioral disorders: Secondary | ICD-10-CM | POA: Diagnosis not present

## 2024-08-29 DIAGNOSIS — K5090 Crohn's disease, unspecified, without complications: Secondary | ICD-10-CM | POA: Diagnosis present

## 2024-08-29 DIAGNOSIS — E559 Vitamin D deficiency, unspecified: Secondary | ICD-10-CM | POA: Diagnosis present

## 2024-08-29 DIAGNOSIS — F17210 Nicotine dependence, cigarettes, uncomplicated: Secondary | ICD-10-CM | POA: Diagnosis present

## 2024-08-29 DIAGNOSIS — F1210 Cannabis abuse, uncomplicated: Secondary | ICD-10-CM | POA: Diagnosis present

## 2024-08-29 DIAGNOSIS — Z716 Tobacco abuse counseling: Secondary | ICD-10-CM

## 2024-08-29 DIAGNOSIS — F410 Panic disorder [episodic paroxysmal anxiety] without agoraphobia: Secondary | ICD-10-CM | POA: Diagnosis present

## 2024-08-29 DIAGNOSIS — F411 Generalized anxiety disorder: Secondary | ICD-10-CM | POA: Diagnosis present

## 2024-08-29 DIAGNOSIS — F1290 Cannabis use, unspecified, uncomplicated: Secondary | ICD-10-CM | POA: Diagnosis not present

## 2024-08-29 MED ORDER — HALOPERIDOL LACTATE 5 MG/ML IJ SOLN: 10.0000 mg | Freq: Three times a day (TID) | INTRAMUSCULAR | Status: DC | PRN

## 2024-08-29 MED ORDER — DIPHENHYDRAMINE HCL 25 MG PO CAPS: 50.0000 mg | ORAL_CAPSULE | Freq: Three times a day (TID) | ORAL | Status: DC | PRN

## 2024-08-29 MED ORDER — OLANZAPINE 10 MG PO TBDP: 10.0000 mg | ORAL_TABLET | Freq: Every day | ORAL | Status: DC

## 2024-08-29 MED ORDER — METHOCARBAMOL 500 MG PO TABS
500.0000 mg | ORAL_TABLET | Freq: Three times a day (TID) | ORAL | Status: AC | PRN
  Filled 2024-08-29: qty 1

## 2024-08-29 MED ORDER — HALOPERIDOL 5 MG PO TABS: 5.0000 mg | ORAL_TABLET | Freq: Three times a day (TID) | ORAL | Status: DC | PRN

## 2024-08-29 MED ORDER — ONDANSETRON 4 MG PO TBDP: 4.0000 mg | ORAL_TABLET | Freq: Four times a day (QID) | ORAL | Status: AC | PRN

## 2024-08-29 MED ORDER — TRAZODONE HCL 50 MG PO TABS
50.0000 mg | ORAL_TABLET | Freq: Every evening | ORAL | Status: DC | PRN
  Administered 2024-08-30 (×2): 50 mg via ORAL
  Filled 2024-08-29 (×2): qty 1

## 2024-08-29 MED ORDER — NICOTINE POLACRILEX 2 MG MT GUM
2.0000 mg | CHEWING_GUM | OROMUCOSAL | Status: DC | PRN
  Administered 2024-08-29 – 2024-09-03 (×12): 2 mg via ORAL
  Filled 2024-08-29 (×4): qty 1

## 2024-08-29 MED ORDER — OLANZAPINE 5 MG PO TBDP: 5.0000 mg | ORAL_TABLET | Freq: Every day | ORAL | Status: DC

## 2024-08-29 MED ORDER — ACETAMINOPHEN 325 MG PO TABS: 650.0000 mg | ORAL_TABLET | Freq: Four times a day (QID) | ORAL | Status: DC | PRN

## 2024-08-29 MED ORDER — CLONIDINE HCL 0.1 MG PO TABS
0.1000 mg | ORAL_TABLET | Freq: Every day | ORAL | Status: DC
  Filled 2024-08-29 (×2): qty 1

## 2024-08-29 MED ORDER — CLONIDINE HCL 0.1 MG PO TABS
0.1000 mg | ORAL_TABLET | Freq: Four times a day (QID) | ORAL | Status: AC
  Administered 2024-08-29: 0.1 mg via ORAL
  Filled 2024-08-29: qty 1

## 2024-08-29 MED ORDER — CLONAZEPAM 0.25 MG PO TBDP
0.5000 mg | ORAL_TABLET | Freq: Once | ORAL | Status: AC
  Administered 2024-08-29: 0.5 mg via ORAL
  Filled 2024-08-29: qty 2

## 2024-08-29 MED ORDER — NICOTINE 21 MG/24HR TD PT24: 21.0000 mg | MEDICATED_PATCH | Freq: Every day | TRANSDERMAL | Status: DC

## 2024-08-29 MED ORDER — ALUM & MAG HYDROXIDE-SIMETH 200-200-20 MG/5ML PO SUSP: 30.0000 mL | ORAL | Status: DC | PRN

## 2024-08-29 MED ORDER — LORAZEPAM 2 MG/ML IJ SOLN: 2.0000 mg | Freq: Three times a day (TID) | INTRAMUSCULAR | Status: DC | PRN

## 2024-08-29 MED ORDER — OLANZAPINE 5 MG PO TBDP
5.0000 mg | ORAL_TABLET | Freq: Every day | ORAL | Status: DC
  Filled 2024-08-29: qty 1

## 2024-08-29 MED ORDER — HALOPERIDOL LACTATE 5 MG/ML IJ SOLN: 5.0000 mg | Freq: Three times a day (TID) | INTRAMUSCULAR | Status: DC | PRN

## 2024-08-29 MED ORDER — DICYCLOMINE HCL 20 MG PO TABS: 20.0000 mg | ORAL_TABLET | Freq: Four times a day (QID) | ORAL | Status: AC | PRN

## 2024-08-29 MED ORDER — DIPHENHYDRAMINE HCL 50 MG/ML IJ SOLN: 50.0000 mg | Freq: Three times a day (TID) | INTRAMUSCULAR | Status: DC | PRN

## 2024-08-29 MED ORDER — HYDROXYZINE HCL 25 MG PO TABS
25.0000 mg | ORAL_TABLET | Freq: Four times a day (QID) | ORAL | Status: DC | PRN
  Administered 2024-08-29 – 2024-09-02 (×7): 25 mg via ORAL
  Filled 2024-08-29 (×7): qty 1

## 2024-08-29 MED ORDER — NAPROXEN 500 MG PO TABS: 500.0000 mg | ORAL_TABLET | Freq: Two times a day (BID) | ORAL | Status: AC | PRN

## 2024-08-29 MED ORDER — CLONIDINE HCL 0.1 MG PO TABS
0.1000 mg | ORAL_TABLET | ORAL | Status: AC
  Administered 2024-08-31: 0.1 mg via ORAL
  Filled 2024-08-29 (×2): qty 1

## 2024-08-29 MED ORDER — LOPERAMIDE HCL 2 MG PO CAPS: 2.0000 mg | ORAL_CAPSULE | ORAL | Status: AC | PRN

## 2024-08-29 MED ORDER — NICOTINE 21 MG/24HR TD PT24
21.0000 mg | MEDICATED_PATCH | Freq: Every day | TRANSDERMAL | Status: DC
  Administered 2024-08-30 – 2024-09-03 (×5): 21 mg via TRANSDERMAL
  Filled 2024-08-29 (×5): qty 1

## 2024-08-29 MED ORDER — MAGNESIUM HYDROXIDE 400 MG/5ML PO SUSP: 30.0000 mL | Freq: Every day | ORAL | Status: DC | PRN

## 2024-08-29 NOTE — ED Notes (Signed)
 Pt sleeping at present, no distress noted.  Monitoring for safety.

## 2024-08-29 NOTE — Progress Notes (Signed)
 Patient ID: Manuel [name], male   DOB: April 11, 2001, 23 y.o.   MRN: [id number] Patient is 23 yr old male, voluntary, first admission to [facility].  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  Recently lost job at [employer], [place] (worked there couple months).  Patient stated he could not focus.  Presently not attending school and not working.  Stated he lives with parents who knew he was very depressed because of tinnitus (severe tinnitus for the last three months).  He read on the internet that Hartley was helpful which he tried three days which was helping him.  After 4th day, it did not help him.  Stated he enjoys playing video games, outdoors, nature.  During admission patient denied SI, contracts for safety.  He did deny SI, but then made comment not seriously wishing I was dead or did not wake up.  Denied HI.  Denied A/V hallucinations.  Rated anxiety 8, depression 1, hopeless 2.  Stated he vapes tobacco every day 10 hits every day.  Denied all use of alcohol.  THC since 23 yrs old, uses occasionally twice weekly, last used 4 days ago.  Has used Kratum for the past 8 days, has never used that before.  Has prescription for klonipin since August 2025.  Clonidine prn for withdrawals, one pill every 8 hours.  Dr [facility], for anxiety, panic.  Sleeps 8-9 hours nightly.  History of crohn's, bowel resection in 2015.   Fall risk information given and discussed with patient, low fall risk. Patient oriented to unit and given food/drink.

## 2024-08-29 NOTE — ED Notes (Signed)
 Paitent provided breakfast.

## 2024-08-29 NOTE — [facility] Group Notes (Signed)
 Patient did not attend the RN group.

## 2024-08-29 NOTE — Plan of Care (Signed)
 Nurse discussed coping skills with patient.

## 2024-08-29 NOTE — Tx Team (Signed)
 Initial Treatment Plan 08/29/2024 6:01 PM [name] FMW:[id number]    PATIENT STRESSORS: Educational concerns   Financial difficulties   Health problems   Occupational concerns   Substance abuse     PATIENT STRENGTHS: Ability for insight  Average or above average intelligence  Capable of independent living  Communication skills  General fund of knowledge  Motivation for treatment/growth  Physical Health  Supportive family/friends    PATIENT IDENTIFIED PROBLEMS: Anxiety  Depression  Panic   Suicide thoughts occasionally, not wake up               DISCHARGE CRITERIA:  Ability to meet basic life and health needs Adequate post-discharge living arrangements Improved stabilization in mood, thinking, and/or behavior Medical problems require only outpatient monitoring Motivation to continue treatment in a less acute level of care Need for constant or close observation no longer present Reduction of life-threatening or endangering symptoms to within safe limits Safe-care adequate arrangements made Verbal commitment to aftercare and medication compliance  PRELIMINARY DISCHARGE PLAN: Attend aftercare/continuing care group Attend PHP/IOP Attend 12-step recovery group Outpatient therapy Return to previous living arrangement  PATIENT/FAMILY INVOLVEMENT: This treatment plan has been presented to and reviewed with the patient, Manuel [name], and/or family member..  The patient and family have been given the opportunity to ask questions and make suggestions.  Anice Line [place], RN 08/29/2024, 6:01 PM

## 2024-08-29 NOTE — Progress Notes (Signed)
 Pt has been accepted to [facility] on 08/29/2024 Bed assignment: 306-02  Pt meets inpatient criteria per: Bridgette Salt, NP   Attending Physician will be: Dr. Raliegh MD  Report can be called to:e unit: Adult unit: [phone]  Pt can arrive after [facility] WILL UPDATE  Care Team Notified: [facility] Cherylynn Ernst RN, [place], [place], NP, Zaira Bird RN  [place] Driskill LCSW-A   08/29/2024 9:43 AM

## 2024-08-29 NOTE — Progress Notes (Signed)
 Patient refused HS Zyprexa he became agitated when he did not get Klonopin, stating his Home Provider had stopped it because the medication was giving him anxiety and sleep walking. Patient requested for Klonopin stating it's the only medication that works for my ringing ear, if I don't get it will be a medical emergency. On call Provider notified. OTO of Klonopin given. Patient was calm after medication was given.

## 2024-08-29 NOTE — Progress Notes (Signed)
Pt did attend NA group. 

## 2024-08-29 NOTE — ED Provider Notes (Signed)
 Behavioral Health Progress Note  Date and Time: 08/29/2024 9:54 AM Name: Manuel [name] MRN:  [id number]  Subjective:  I slept bad  Manuel [name], is seen face to face by this provider, consulted with Dr. Cole; and chart reviewed on 08/29/24.  On evaluation Manuel [name] reports that another patient was screaming overnight, which disrupted his sleep as well as that of others on the unit. He denies suicidal ideation, homicidal ideation, auditory or visual hallucinations.  He asked, "If I answer, would you keep me here or send me home?" Provider informed the patient that he must express exactly how he is feeling. The patient denies withdrawal symptoms and states he is ready to go home. He shared that he spoke with his parents, who believed that coming here would help resolve his tinnitus and improve his mental health. He requested that the provider call his mother, Manuel [name], at [phone]. He reports a good appetite and denies paranoia. He expressed that he does not want to explore rehab options, stating they are too expensive. The provider encouraged him to engage with the social worker to  at least explore available resources.  Collateral Information:  Per the patient's request, the provider spoke with his mother, Manuel [name]. She expressed concern for the patient's well-being, stating that she worries she might come home one day and find him dead. She reports that the patient has been expressing thoughts such as, "His life is over and they should let him go; he has no reason for living." She reports that these thoughts have been ongoing for several months but have worsened in recent weeks, and that he still expressed it to her yesterday. She denied that the patient has any history of self-harm or of ever acting on his suicidal thoughts.  Manuel [name] reports that the patient currently stays at home and is unemployed. He lost his job over the summer, reportedly due to issues related to  tinnitus. He is followed by Dr. Charlanne, and they have been exploring treatment options including SSRIs for OCD and ketamine for depression. He also has an audiology appointment scheduled for 11/12 regarding his tinnitus which seems to be his main stressor from his conversation with the provider. He expressed that tinnitus has ruined everything yesterday, that he lost his job, his girlfriend and the trust of his family.   When asked about medication compliance, the patient's mother reported that he has not been compliant with his medications and that he does a lot of research online. The patient does not currently have a therapist but was in the process of setting up an appointment through [facility]. The provider discussed with the pt's mother that intensive outpatient treatment might be more beneficial at this time. We also discussed the possibility of an autism assessment, though she reports that the patient is reluctant to pursue a diagnosis, despite prior exploration of this concern. The patient's mother reports that since his admission, no one has contacted her. When asked if she was the POA or legal guardian, she stated she was not. She was informed that we are not legally allowed to call and update her unless the patient authorizes it. She said she was contacted during his last hospitalization, and per the notes--as with today--the patient had requested that the provider call her. The mother verbalized understanding and was informed that if she wants the providers to speak with her, the patient must give us  permission to do so.  Based on the patient's presentation and collateral information from his  mother, inpatient psychiatric hospitalization is recommended.  During evaluation Manuel [name] is sitting upright position in no acute distress.  He is alert & oriented x 4, calm, cooperative and attentive for this assessment.  His mood is anxious, depressed with congruent affect.  He has normal speech, and  behavior.  Objectively there is no evidence of psychosis/mania or delusional thinking. Pt does not appear to be responding to internal or external stimuli.  Patient is able to converse coherently, goal directed thoughts, no distractibility, or pre-occupation. He also denies suicidal/self-harm/homicidal ideation, psychosis, and paranoia.  Patient answered question appropriately.    Diagnosis:  Final diagnoses:  Opioid abuse with opioid-induced mood disorder (HCC)  Substance induced mood disorder (HCC)  Crohn's colitis, unspecified complication (HCC)  Major depressive disorder, recurrent episode, moderate (HCC)    Total Time spent with patient: 1 hour  Past Psychiatric History: Suicidal ideations, anxiety, MDD, Asperger Syndrome, OCD, anxiety and depression.  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  Family History: Family history of aortic dissection. Family Psychiatric  History:  Non-reported.  Social History: Patient lives at home with his mother, has an medical laboratory scientific officer, and is currently unemployed.   Additional Social History:    Pain Medications: See MAR Prescriptions: See MAR Over the Counter: See MAR History of alcohol / drug use?: Yes Longest period of sobriety (when/how long): NA Negative Consequences of Use:  (NA) Withdrawal Symptoms: None Name of Substance 1: Kratom 1 - Age of First Use: unknown 1 - Amount (size/oz): unknown 1 - Frequency: daily 1 - Duration: ongoing 1 - Last Use / Amount: past 24 hours 1 - Method of Aquiring: unknown 1- Route of Use: oral consumption in pill form                  Sleep: Fair  Appetite:  Good  Current Medications:  Current Facility-Administered Medications  Medication Dose Route Frequency Provider Last Rate Last Admin   acetaminophen  (TYLENOL ) tablet 650 mg  650 mg Oral Q6H PRN Hoang, Daniela B, MD       alum & mag hydroxide-simeth  (MAALOX/MYLANTA) 200-200-20 MG/5ML suspension 30 mL  30 mL Oral Q4H PRN Hoang, Daniela B, MD       clonazePAM (KLONOPIN) tablet 0.5 mg  0.5 mg Oral QHS Hoang, Daniela B, MD   0.5 mg at 08/28/24 1842   cloNIDine (CATAPRES) tablet 0.1 mg  0.1 mg Oral QID Hoang, Daniela B, MD   0.1 mg at 08/28/24 2115   Followed by   NOREEN ON 08/30/2024] cloNIDine (CATAPRES) tablet 0.1 mg  0.1 mg Oral BH-qamhs Hoang, Daniela B, MD       Followed by   NOREEN ON 09/02/2024] cloNIDine (CATAPRES) tablet 0.1 mg  0.1 mg Oral QAC breakfast Hoang, Daniela B, MD       dicyclomine (BENTYL) tablet 20 mg  20 mg Oral Q6H PRN Hoang, Daniela B, MD       haloperidol (HALDOL) tablet 5 mg  5 mg Oral TID PRN Hoang, Daniela B, MD       And   diphenhydrAMINE (BENADRYL) capsule 50 mg  50 mg Oral TID PRN Hoang, Daniela B, MD       haloperidol lactate (HALDOL) injection 5 mg  5 mg Intramuscular TID PRN Hoang, Daniela B, MD       And   diphenhydrAMINE (BENADRYL) injection 50 mg  50 mg Intramuscular TID PRN Hoang, Daniela B, MD  And   LORazepam (ATIVAN) injection 2 mg  2 mg Intramuscular TID PRN Hoang, Daniela B, MD       haloperidol lactate (HALDOL) injection 10 mg  10 mg Intramuscular TID PRN Hoang, Daniela B, MD       And   diphenhydrAMINE (BENADRYL) injection 50 mg  50 mg Intramuscular TID PRN Hoang, Daniela B, MD       And   LORazepam (ATIVAN) injection 2 mg  2 mg Intramuscular TID PRN Hoang, Daniela B, MD       hydrOXYzine (ATARAX) tablet 25 mg  25 mg Oral TID PRN Hoang, Daniela B, MD   25 mg at 08/28/24 2119   loperamide (IMODIUM) capsule 2-4 mg  2-4 mg Oral PRN Hoang, Daniela B, MD       magnesium hydroxide (MILK OF MAGNESIA) suspension 30 mL  30 mL Oral Daily PRN Hoang, Daniela B, MD       methocarbamol  (ROBAXIN ) tablet 500 mg  500 mg Oral Q8H PRN Hoang, Daniela B, MD       naproxen (NAPROSYN) tablet 500 mg  500 mg Oral BID PRN Hoang, Daniela B, MD       nicotine  polacrilex (NICORETTE) gum 2 mg  2 mg Oral PRN Hoang,  Daniela B, MD   2 mg at 08/29/24 0941   OLANZapine zydis (ZYPREXA) disintegrating tablet 10 mg  10 mg Oral QHS Cloria Ciresi, NP       OLANZapine zydis (ZYPREXA) disintegrating tablet 5 mg  5 mg Oral Daily Charlesia Canaday, NP       ondansetron  (ZOFRAN -ODT) disintegrating tablet 4 mg  4 mg Oral Q6H PRN Hoang, Daniela B, MD       traZODone  (DESYREL ) tablet 50 mg  50 mg Oral QHS PRN Hoang, Daniela B, MD       Current Outpatient Medications  Medication Sig Dispense Refill   Acetylcysteine (NAC PO) Take 1 tablet by mouth daily.     clonazePAM (KLONOPIN) 0.5 MG tablet Take 0.5 mg by mouth daily.     cloNIDine (CATAPRES) 0.1 MG tablet Take 0.1 mg by mouth every 8 (eight) hours as needed.     gabapentin (NEURONTIN) 300 MG capsule Take 300 mg by mouth daily.     inFLIXimab-dyyb (INFLECTRA IV) Inject 370 mg into the vein See admin instructions. Inject into venous catheter.     Multiple Vitamins-Minerals (MULTIVITAMIN MEN PO) Take 1 Dose by mouth daily. Take 1 gummy by mouth daily.     Buprenorphine HCl-Naloxone HCl 8-2 MG FILM Place 1 Film under the tongue in the morning and at bedtime. (Patient not taking: Reported on 08/28/2024)     EPINEPHrine (ADRENALIN) 1 MG/ML SOLN injection Inject 0.3 mg into the muscle See admin instructions. Re-initiate treatment only upon physician approval (Patient not taking: Reported on 08/15/2024)     fluvoxaMINE (LUVOX) 25 MG tablet Take 25 mg by mouth daily. (Patient not taking: Reported on 08/28/2024)     OLANZapine (ZYPREXA) 10 MG tablet Take 10 mg by mouth at bedtime. (Patient not taking: Reported on 08/28/2024)     OLANZapine (ZYPREXA) 5 MG tablet Take 5 mg by mouth daily. (Patient not taking: Reported on 08/28/2024)     ONDANSETRON  PO Take 1 tablet by mouth See admin instructions. Frequency and dose unspecified (Patient not taking: Reported on 08/28/2024)     PREDNISONE  PO Take 1 Dose by mouth See admin instructions. Frequency unspecified (Patient not  taking: Reported on 08/28/2024)     Risankizumab-rzaa [facility]  PEN [place]) Inject 1 Dose into the skin See admin instructions. Frequency unknown (Patient not taking: Reported on 08/28/2024)     SPRAVATO, 84 MG DOSE, 28 MG/DEVICE SOPK Place 3 sprays into both nostrils See admin instructions. Frequency unspecified (Patient not taking: Reported on 08/28/2024)      Labs  Lab Results:  Admission on 08/27/2024  Component Date Value Ref Range Status   WBC 08/27/2024 5.7  4.0 - 10.5 K/uL Final   RBC 08/27/2024 4.93  4.22 - 5.81 MIL/uL Final   Hemoglobin 08/27/2024 14.4  13.0 - 17.0 g/dL Final   HCT 88/96/7974 43.2  39.0 - 52.0 % Final   MCV 08/27/2024 87.6  80.0 - 100.0 fL Final   MCH 08/27/2024 29.2  26.0 - 34.0 pg Final   MCHC 08/27/2024 33.3  30.0 - 36.0 g/dL Final   RDW 88/96/7974 12.2  11.5 - 15.5 % Final   Platelets 08/27/2024 188  150 - 400 K/uL Final   nRBC 08/27/2024 0.0  0.0 - 0.2 % Final   Neutrophils Relative % 08/27/2024 64  % Final   Neutro Abs 08/27/2024 3.7  1.7 - 7.7 K/uL Final   Lymphocytes Relative 08/27/2024 25  % Final   Lymphs Abs 08/27/2024 1.5  0.7 - 4.0 K/uL Final   Monocytes Relative 08/27/2024 9  % Final   Monocytes Absolute 08/27/2024 0.5  0.1 - 1.0 K/uL Final   Eosinophils Relative 08/27/2024 2  % Final   Eosinophils Absolute 08/27/2024 0.1  0.0 - 0.5 K/uL Final   Basophils Relative 08/27/2024 0  % Final   Basophils Absolute 08/27/2024 0.0  0.0 - 0.1 K/uL Final   Immature Granulocytes 08/27/2024 0  % Final   Abs Immature Granulocytes 08/27/2024 0.01  0.00 - 0.07 K/uL Final   Performed at [facility], [address] [place], [place] [address]   Sodium 08/27/2024 142  135 - 145 mmol/L Final   Potassium 08/27/2024 4.2  3.5 - 5.1 mmol/L Final   Chloride 08/27/2024 102  98 - 111 mmol/L Final   CO2 08/27/2024 26  22 - 32 mmol/L Final   Glucose, Bld 08/27/2024 63 (L)  70 - 99 mg/dL Final   Glucose reference range applies only to samples taken after fasting for at  least 8 hours.   BUN 08/27/2024 13  6 - 20 mg/dL Final   Creatinine, Ser 08/27/2024 1.12  0.61 - 1.24 mg/dL Final   Calcium 88/96/7974 9.2  8.9 - 10.3 mg/dL Final   Total Protein 88/96/7974 7.2  6.5 - 8.1 g/dL Final   Albumin 88/96/7974 4.0  3.5 - 5.0 g/dL Final   AST 88/96/7974 22  15 - 41 U/L Final   ALT 08/27/2024 17  0 - 44 U/L Final   Alkaline Phosphatase 08/27/2024 63  38 - 126 U/L Final   Total Bilirubin 08/27/2024 0.9  0.0 - 1.2 mg/dL Final   GFR, Estimated 08/27/2024 >60  >60 mL/min Final   Comment: (NOTE) Calculated using the CKD-EPI Creatinine Equation (2021)    Anion gap 08/27/2024 14  5 - 15 Final   Performed at [facility], [address] [place], [place] [address]   Magnesium 08/27/2024 2.3  1.7 - 2.4 mg/dL Final   Performed at [facility], [address] [place], [place] [address]   Alcohol, Ethyl (B) 08/27/2024 <15  <15 mg/dL Final   Comment: (NOTE) For medical purposes only. Performed at [facility], [address], [place], [place] [address]    POC Amphetamine UR 08/28/2024 None Detected  NONE DETECTED (Cut Off Level 1000 ng/mL) Final   POC Secobarbital (BAR) 08/28/2024 None Detected  NONE DETECTED (Cut Off Level 300 ng/mL) Final   POC Buprenorphine (BUP) 08/28/2024 None Detected  NONE DETECTED (Cut Off Level 10 ng/mL) Final   POC Oxazepam (BZO) 08/28/2024 None Detected  NONE DETECTED (Cut Off Level 300 ng/mL) Final   POC Cocaine UR 08/28/2024 None Detected  NONE DETECTED (Cut Off Level 300 ng/mL) Final   POC Methamphetamine UR 08/28/2024 None Detected  NONE DETECTED (Cut Off Level 1000 ng/mL) Final   POC Morphine  08/28/2024 None Detected  NONE DETECTED (Cut Off Level 300 ng/mL) Final   POC Methadone UR 08/28/2024 None Detected  NONE DETECTED (Cut Off Level 300 ng/mL) Final   POC Oxycodone  UR 08/28/2024 None Detected  NONE DETECTED (Cut Off Level 100 ng/mL) Final   POC Marijuana UR 08/28/2024 Positive (A)  NONE DETECTED (Cut Off Level 50  ng/mL) Final  Admission on 08/14/2024, Discharged on 08/15/2024  Component Date Value Ref Range Status   WBC 08/14/2024 6.7  4.0 - 10.5 K/uL Final   RBC 08/14/2024 4.88  4.22 - 5.81 MIL/uL Final   Hemoglobin 08/14/2024 14.3  13.0 - 17.0 g/dL Final   HCT 89/78/7974 42.5  39.0 - 52.0 % Final   MCV 08/14/2024 87.1  80.0 - 100.0 fL Final   MCH 08/14/2024 29.3  26.0 - 34.0 pg Final   MCHC 08/14/2024 33.6  30.0 - 36.0 g/dL Final   RDW 89/78/7974 12.5  11.5 - 15.5 % Final   Platelets 08/14/2024 210  150 - 400 K/uL Final   nRBC 08/14/2024 0.0  0.0 - 0.2 % Final   Neutrophils Relative % 08/14/2024 68  % Final   Neutro Abs 08/14/2024 4.5  1.7 - 7.7 K/uL Final   Lymphocytes Relative 08/14/2024 20  % Final   Lymphs Abs 08/14/2024 1.4  0.7 - 4.0 K/uL Final   Monocytes Relative 08/14/2024 10  % Final   Monocytes Absolute 08/14/2024 0.7  0.1 - 1.0 K/uL Final   Eosinophils Relative 08/14/2024 1  % Final   Eosinophils Absolute 08/14/2024 0.1  0.0 - 0.5 K/uL Final   Basophils Relative 08/14/2024 1  % Final   Basophils Absolute 08/14/2024 0.0  0.0 - 0.1 K/uL Final   Immature Granulocytes 08/14/2024 0  % Final   Abs Immature Granulocytes 08/14/2024 0.01  0.00 - 0.07 K/uL Final   Performed at [facility], [address], [place], [place] [address]   Sodium 08/14/2024 139  135 - 145 mmol/L Final   Potassium 08/14/2024 3.4 (L)  3.5 - 5.1 mmol/L Final   Chloride 08/14/2024 103  98 - 111 mmol/L Final   CO2 08/14/2024 26  22 - 32 mmol/L Final   Glucose, Bld 08/14/2024 89  70 - 99 mg/dL Final   Glucose reference range applies only to samples taken after fasting for at least 8 hours.   BUN 08/14/2024 10  6 - 20 mg/dL Final   Creatinine, Ser 08/14/2024 1.01  0.61 - 1.24 mg/dL Final   Calcium 89/78/7974 8.9  8.9 - 10.3 mg/dL Final   Total Protein 89/78/7974 7.0  6.5 - 8.1 g/dL Final   Albumin 89/78/7974 4.2  3.5 - 5.0 g/dL Final   AST 89/78/7974 18  15 - 41 U/L Final   ALT 08/14/2024 13  0 - 44 U/L  Final   Alkaline Phosphatase  08/14/2024 73  38 - 126 U/L Final   Total Bilirubin 08/14/2024 0.8  0.0 - 1.2 mg/dL Final   GFR, Estimated 08/14/2024 >60  >60 mL/min Final   Comment: (NOTE) Calculated using the CKD-EPI Creatinine Equation (2021)    Anion gap 08/14/2024 10  5 - 15 Final   Performed at [facility], [address], [place], [place] [address]   Hgb A1c MFr Bld 08/14/2024 4.7 (L)  4.8 - 5.6 % Final   Comment: (NOTE) Diagnosis of Diabetes The following HbA1c ranges recommended by the American Diabetes Association (ADA) may be used as an aid in the diagnosis of diabetes mellitus.  Hemoglobin             Suggested A1C NGSP%              Diagnosis  <5.7                   Non Diabetic  5.7-6.4                Pre-Diabetic  >6.4                   Diabetic  <7.0                   Glycemic control for                       adults with diabetes.     Mean Plasma Glucose 08/14/2024 88.19  mg/dL Final   Performed at [facility], [address], [place], [place] [address]   Cholesterol 08/14/2024 136  0 - 200 mg/dL Final   Triglycerides 89/78/7974 115  <150 mg/dL Final   HDL 89/78/7974 57  >40 mg/dL Final   Total CHOL/HDL Ratio 08/14/2024 2.4  RATIO Final   VLDL 08/14/2024 23  0 - 40 mg/dL Final   LDL Cholesterol 08/14/2024 56  0 - 99 mg/dL Final   Comment:        Total Cholesterol/HDL:CHD Risk Coronary Heart Disease Risk Table                     Men   Women  1/2 Average Risk   3.4   3.3  Average Risk       5.0   4.4  2 X Average Risk   9.6   7.1  3 X Average Risk  23.4   11.0        Use the calculated Patient Ratio above and the CHD Risk Table to determine the patient's CHD Risk.        ATP III CLASSIFICATION (LDL):  <100     mg/dL   Optimal  899-870  mg/dL   Near or Above                    Optimal  130-159  mg/dL   Borderline  839-810  mg/dL   High  >809     mg/dL   Very High Performed at [facility], [address], [place], [place]  [address]    TSH 08/14/2024 1.703  0.350 - 4.500 uIU/mL Final   Comment: Performed by a 3rd Generation assay with a functional sensitivity of <=0.01 uIU/mL. Performed at [facility], [address] [place], [place] [address]    POC Amphetamine UR 08/14/2024 None Detected  NONE DETECTED (Cut Off Level 1000 ng/mL) Corrected  POC Secobarbital (BAR) 08/14/2024 None Detected  NONE DETECTED (Cut Off Level 300 ng/mL) Corrected   POC Buprenorphine (BUP) 08/14/2024 None Detected  NONE DETECTED (Cut Off Level 10 ng/mL) Corrected   POC Oxazepam (BZO) 08/14/2024 None Detected  NONE DETECTED (Cut Off Level 300 ng/mL) Corrected   POC Cocaine UR 08/14/2024 None Detected  NONE DETECTED (Cut Off Level 300 ng/mL) Corrected   POC Methamphetamine UR 08/14/2024 None Detected  NONE DETECTED (Cut Off Level 1000 ng/mL) Corrected   POC Morphine  08/14/2024 None Detected  NONE DETECTED (Cut Off Level 300 ng/mL) Corrected   POC Methadone UR 08/14/2024 None Detected  NONE DETECTED (Cut Off Level 300 ng/mL) Corrected   POC Oxycodone  UR 08/14/2024 None Detected  NONE DETECTED (Cut Off Level 100 ng/mL) Corrected   POC Marijuana UR 08/14/2024 Positive (A)  NONE DETECTED (Cut Off Level 50 ng/mL) Corrected    Blood Alcohol level:  Lab Results  Component Value Date   [facility] <15 08/27/2024    Metabolic Disorder Labs: Lab Results  Component Value Date   HGBA1C 4.7 (L) 08/14/2024   MPG 88.19 08/14/2024   No results found for: PROLACTIN Lab Results  Component Value Date   CHOL 136 08/14/2024   TRIG 115 08/14/2024   HDL 57 08/14/2024   CHOLHDL 2.4 08/14/2024   VLDL 23 08/14/2024   LDLCALC 56 08/14/2024   LDLCALC 59 12/29/2012    Therapeutic Lab Levels: No results found for: LITHIUM No results found for: VALPROATE No results found for: CBMZ  Physical Findings   PHQ2-9    Flowsheet Row Office Visit from 09/16/2020 in [facility] [place] Total Score 0    Flowsheet Row ED from 08/27/2024 in [facility] ED from 08/14/2024 in [facility] ED from 08/08/2024 in [facility] Emergency Department at [facility]  C-SSRS RISK CATEGORY Low Risk Low Risk No Risk     Musculoskeletal  Strength & Muscle Tone: within normal limits Gait & Station: normal Patient leans: N/A  Psychiatric Specialty Exam  Presentation  General Appearance:  Appropriate for Environment  Eye Contact: Minimal  Speech: Clear and Coherent  Speech Volume: Normal  Handedness: Right   Mood and Affect  Mood: Euthymic  Affect: Congruent   Thought Process  Thought Processes: Coherent  Descriptions of Associations:Intact  Orientation:Full (Time, Place and Person)  Thought Content:WDL  Diagnosis of Schizophrenia or Schizoaffective disorder in past: No    Hallucinations:Hallucinations: None  Ideas of Reference:None  Suicidal Thoughts:Suicidal Thoughts: No  Homicidal Thoughts:Homicidal Thoughts: No   Sensorium  Memory: Immediate Fair  Judgment: Fair  Insight: Fair   Art Therapist  Concentration: Fair  Attention Span: Fair  Recall: [employer] of Knowledge: Fair  Language: Fair   Psychomotor Activity  Psychomotor Activity: Psychomotor Activity: Normal   Assets  Assets: Housing; Social Support; Communication Skills   Sleep  Sleep: Sleep: Fair  No Safety Checks orders active in given range  No data recorded  Physical Exam  Physical Exam Vitals reviewed.  Neurological:     Mental Status: He is oriented to person, place, and time.  Psychiatric:        Attention and Perception: Attention normal.        Mood and Affect: Mood normal. Affect is flat.        Speech: Speech normal.        Behavior: Behavior normal. Behavior is cooperative.  Thought Content: Thought content normal.        Cognition and Memory: Cognition normal.         Judgment: Judgment normal.    Review of Systems  Psychiatric/Behavioral:  Positive for substance abuse. The patient has insomnia.   All other systems reviewed and are negative.  Blood pressure (!) 98/55, pulse 67, temperature 97.7 F (36.5 C), temperature source Oral, resp. rate 18, SpO2 99%. There is no height or weight on file to calculate BMI.  Treatment Plan Summary: Plan: Transfer to [facility] when a bed becomes available.   Home Meds Restarted:  Olanzapine 5mg  PO Daily- Mood Olanzapine 10mg  PO at bedtime - Mood -Continue agitation protocol -Continue Tylenol , 650mg , oral, every 6 hours PRN, mild pain -Continue Maalox/Mylanta 30mL, oral, every 4 hours PRN, indigestion -Continue Atarax 25mg , oral, 3 times daily PRN, anxiety -Continue Milk of Magnesia, 30 mL, oral, daily PRN, mild constipation -Continue Desyrel , 50mg , oral, at bedtime PRN, sleep -Continue Naproxen 500 mg BID as needed for pain -Continue Bentyl 20 mg every 6 hours as needed for spasms/abdominal cramping -Continue Robaxin  500 mg every 8 hours as needed for muscle spasms -Continue Zofran  4 mg every 6 hours as needed for nausea or vomiting -Continue Imodium 2 to 4 mg as needed for diarrhea or loose stools  Pre-admit orders entered  Tosin Shyana Kulakowski, NP 08/29/2024 9:54 AM

## 2024-08-29 NOTE — Plan of Care (Signed)
Nurse discussed anxiety, depression, coping skills with patient. 

## 2024-08-29 NOTE — ED Notes (Signed)
 Patient is transferring to [facility] at this time via safe transport. Voluntary consent, EMTALA, and other transfer paperwork sent with patient and provided to transport. Patient denies SI,HI, and A/V/H. Cooperative at time of transport. All valuables/belongings sent with patient. Patient in no current distress.

## 2024-08-29 NOTE — ED Notes (Addendum)
 Patient resting in recliner. Breathing even and unlabored with even rise and fall of chest. No s/s of current distress.

## 2024-08-29 NOTE — ED Notes (Signed)
 Paitent provided lunch.

## 2024-08-29 NOTE — Group Note (Signed)
 Date:  08/29/2024 Time:  4:57 PM  Group Topic/Focus:  Dimensions of Wellness:   The focus of this group is to introduce the topic of wellness and discuss the role each dimension of wellness plays in total health.    Participation Level:  Did Not Attend  Participation Quality:    Affect:    Cognitive:    Insight:   Engagement in Group:    Modes of Intervention:    Additional Comments:  Pt was admitted after group  Manuel [name] L Dawood Spitler 08/29/2024, 4:57 PM

## 2024-08-30 MED ORDER — CLONAZEPAM 0.25 MG PO TBDP
0.2500 mg | ORAL_TABLET | Freq: Every day | ORAL | Status: DC
  Administered 2024-08-30: 0.25 mg via ORAL
  Filled 2024-08-30: qty 1

## 2024-08-30 MED ORDER — SACCHAROMYCES BOULARDII 250 MG PO CAPS
250.0000 mg | ORAL_CAPSULE | Freq: Two times a day (BID) | ORAL | Status: DC
  Administered 2024-09-01 – 2024-09-03 (×4): 250 mg via ORAL
  Filled 2024-08-30 (×5): qty 1

## 2024-08-30 NOTE — Progress Notes (Signed)
 Patient sleeping on the hallway c/o roommate is snoring ear plugs were provided support and encouragement provided as needed.

## 2024-08-30 NOTE — [facility] Suicide Risk Assessment (Signed)
 Suicide Risk Assessment  Admission Assessment    [facility] Admission Suicide Risk Assessment   Nursing information obtained from:  Patient Demographic factors:  Male, Unemployed, Adolescent or young adult, Caucasian Current Mental Status:  Self-harm thoughts Loss Factors:  Decrease in vocational status Historical Factors:  Impulsivity Risk Reduction Factors:  Living with another person, especially a relative  Total Time spent with patient: 45 minutes Principal Problem: Major depressive disorder, recurrent episode, moderate degree (HCC) Diagnosis:  Principal Problem:   Major depressive disorder, recurrent episode, moderate degree (HCC)  Subjective Data:  CC: I took way too much Kratom and I have had terrible depression   History of Present Illness: Manuel [name] is a 23 yo male with past psychiatric history significant for major depressive disorder and medical history significant for Crohn's disease s/p multiple bowel obstructions resulting in bowel resection, family history of aortic dissection, long term use of opioids, and most recently, sensorineural hearing loss and severe worsening of his 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   [name] reports that he has been having difficulties since approximately March of 2025 when he suffered from a bowel obstruction leading to a necessary bowel resection for his Crohn's disease.   Since that time, he has had worsening obsessive thoughts about his decade long problem of tinnitus. He reports that this has caused severe impairment in his life, particularly since the significant worsening of his tinnitus occurred in September of 2025, leading to the loss of the hobbies he's been most passionate about (music and his hobby of playing the video game counter-strike with friends). He has had severe impairments in his work life too, he blames some of his worsening tinnitus on his professional work as an physiological scientist, in which his exposure to loud sounds  exacerbated the problem. He reports that he now has a constant sound in his R ear that is equivalent in volume to a 70 decibel sound. This impairs not only his hearing but also his concentration and social life.   Beyond this, it has led him to begin to read obsessively about the problem, including exhaustive personal research on the medical literature about tinnitus. He reports that he'll often read the same article multiple times late into the night as though hoping for some new insight. He reports frequent rumination and self-recrimination about his past habits of ignoring volume warnings and listening to loud music/video games. This inward chastisement has led to worsening depressive and anxiety symptoms as below.   He has turned to increasing substance use in order to deal with the constant annoyance of these thoughts. He has had 3x weekly cannabis use (increase), frequent kratom use, and has needed (prescribed) clonazepam in order to deal with nightly panic attacks.   Anxiety and depression about his tinnitus has led to demoralization about his job, contributing to his decision recently to quit his job and move back in with his parents in [place]. The patient also cites difficulties in his relationship with his girlfriend Aleck as his symptoms of depression and anxiety have worsened.   The patient was recommended to come in when his family discovered how frequently he was using kratom to self-medicate.    Continued Clinical Symptoms:  Alcohol Use Disorder Identification Test Final Score (AUDIT): 0 The Alcohol Use Disorders Identification Test, Guidelines for Use in Primary Care, Second Edition.  World Science Writer [facility]). Score between 0-7:  no or low risk or alcohol related problems. Score between 8-15:  moderate risk of alcohol related problems.  Score between 16-19:  high risk of alcohol related problems. Score 20 or above:  warrants further diagnostic evaluation for alcohol  dependence and treatment.   CLINICAL FACTORS:   Panic Attacks Depression:   Anhedonia Comorbid alcohol abuse/dependence Hopelessness Impulsivity Insomnia Alcohol/Substance Abuse/Dependencies Obsessive-Compulsive Disorder Chronic Pain Previous Psychiatric Diagnoses and Treatments Medical Diagnoses and Treatments/Surgeries   Musculoskeletal: Strength & Muscle Tone: within normal limits Gait & Station: normal Patient leans: N/A  Psychiatric Specialty Exam:  Musculoskeletal: Strength & Muscle Tone: within normal limits Gait & Station: normal Patient leans: N/A   Psychiatric Specialty Exam: Mental Status Exam: General Appearance and Behavior: Tall, thin, pale caucasian young adult male. Sparse facial hair wearing a immunologist. His hair is unkempt  Orientation:  Full (Time, Place, and Person)  Memory:  Grossly intact  Attention: Fair  Eye Contact:  Good  Speech:  Clear and Coherent and rapid but not pressured  Language:  Good  Volume:  Normal  Mood: I feel all over the place  Affect:  Appropriate and Full Range  Thought Process:  Disorganized  Thought Content:  Obsessions, Rumination, and Tangential  Suicidal Thoughts:  No  Homicidal Thoughts:  No  Judgement:  Fair  Insight:  Fair  Psychomotor Activity:  Normal  Akathisia:  No  Fund of Knowledge:  Good Assets:  Communication Skills Desire for Improvement Financial Resources/Insurance Housing Intimacy Leisure Time Physical Health Resilience Social Support  Cognition:  WNL  ADL's:  Intact    Details about paranoia, delusions, or hallucinations:   Physical Exam: Vitals reviewed.  Constitutional:      Appearance: Normal appearance.  HENT:     Head: Normocephalic and atraumatic.     Nose: No congestion.  Skin:    General: Skin is warm and dry.     Coloration: Skin is pale.  Neurological:     Mental Status: He is alert and oriented to person, place, and time.     Sensory: Sensory  deficit present.  Psychiatric:        Attention and Perception: Attention and perception normal.        Mood and Affect: Mood is anxious.        Speech: Speech is tangential.        Behavior: Behavior normal. Behavior is cooperative.        Thought Content: Thought content is not paranoid or delusional. Thought content does not include homicidal or suicidal ideation.        Cognition and Memory: Cognition and memory normal.        Judgment: Judgment is impulsive.     Review of Systems  Constitutional:  Negative for chills, diaphoresis, fever, malaise/fatigue and weight loss.  HENT:  Positive for hearing loss and tinnitus.   Respiratory:  Negative for cough.   Cardiovascular:  Negative for chest pain.  Gastrointestinal:  Negative for abdominal pain, constipation, diarrhea, nausea and vomiting.  Genitourinary:  Negative for urgency.  Psychiatric/Behavioral:  Positive for depression and substance abuse. Negative for hallucinations and suicidal ideas. The patient is nervous/anxious and has insomnia.    Blood pressure 106/70, pulse (!) 48, temperature 97.7 F (36.5 C), temperature source Oral, resp. rate 16, height 6' 2 (1.88 m), weight 73.5 kg, SpO2 100%. Body mass index is 20.8 kg/m.     COGNITIVE FEATURES THAT CONTRIBUTE TO RISK:  Thought constriction (tunnel vision)    SUICIDE RISK:  Mild:  There are no identifiable suicide plans, no associated intent, mild dysphoria and related symptoms,  good self-control (both objective and subjective assessment), few other risk factors, and identifiable protective factors, including available and accessible social support.   PLAN OF CARE: See H&P  I certify that inpatient services furnished can reasonably be expected to improve the patient's condition.   Lynwood Morene Lavone Delsie, MD 08/30/2024, 6:02 PM

## 2024-08-30 NOTE — Group Note (Unsigned)
 Date:  08/30/2024 Time:  7:56 AM  Group Topic/Focus:  Coping With Mental Health Crisis:   The purpose of this group is to help patients identify strategies for coping with mental health crisis.  Group discusses possible causes of crisis and ways to manage them effectively.     Participation Level:  {[facility] PARTICIPATION OZCZO:77735}  Participation Quality:  {B[facility] PARTICIPATION QUALITY:22265}  Affect:  {B[facility] AFFECT:22266}  Cognitive:  {[facility] COGNITIVE:22267}  Insight: {BHH Insight2:20797}  Engagement in Group:  {B[facility] ENGAGEMENT IN HMNLE:77731}  Modes of Intervention:  {B[facility] MODES OF INTERVENTION:22269}  Additional Comments:  ***  Jaquetta Currier E Bravery Ketcham 08/30/2024, 7:56 AM

## 2024-08-30 NOTE — Progress Notes (Signed)
 Pt room changed after complaining about the room mate snoring and started yelling at the room mate stating you need to wake up it's my turn to sleep you are snoring I can't sleep

## 2024-08-30 NOTE — Progress Notes (Signed)
(  Sleep Hours) - 3.25  (Any PRNs that were needed, meds refused, or side effects to meds)- Vistaril, Trazodone    (Any disturbances and when (visitation, over night)- Anxiety agitation.  (Concerns raised by the patient)- Roommate snoring. Patient want scheduled Klonopin.  (SI/HI/AVH)- Denies

## 2024-08-30 NOTE — Group Note (Unsigned)
 Date:  08/30/2024 Time:  1:57 PM  Group Topic/Focus:  Emotional Education:   The focus of this group is to discuss what feelings/emotions are, and how they are experienced.    Participation Level:  {[facility] PARTICIPATION OZCZO:77735}  Participation Quality:  {B[facility] PARTICIPATION QUALITY:22265}  Affect:  {B[facility] AFFECT:22266}  Cognitive:  {[facility] COGNITIVE:22267}  Insight: {BHH Insight2:20797}  Engagement in Group:  {B[facility] ENGAGEMENT IN HMNLE:77731}  Modes of Intervention:  {B[facility] MODES OF INTERVENTION:22269}  Additional Comments:  ***  Endre Coutts A Marlis Oldaker 08/30/2024, 1:57 PM

## 2024-08-30 NOTE — Group Note (Signed)
 Date:  08/30/2024 Time:  2:25 PM  Group Topic/Focus:  Emotional Education:   The focus of this group is to discuss what feelings/emotions are, and how they are experienced.    Participation Level:  Active  Participation Quality:  Appropriate  Affect:  Appropriate  Cognitive:  Appropriate  Insight: Appropriate  Engagement in Group:  Engaged  Modes of Intervention:  Discussion  Additional Comments:  Plays video games, travels, and spends time with friends when feeling down.  Ender Rorke A Aivy Akter 08/30/2024, 2:25 PM

## 2024-08-30 NOTE — H&P (Signed)
 Psychiatric Admission Assessment Adult  Patient Identification: Manuel [name] MRN:  [id number] Date of Evaluation:  08/30/2024 Chief Complaint:  Major depressive disorder, recurrent episode, moderate degree (HCC) [F33.1] Principal Diagnosis: Major depressive disorder, recurrent episode, moderate degree (HCC) Diagnosis:  Principal Problem:   Major depressive disorder, recurrent episode, moderate degree (HCC)  CC: I took way too much Kratom and I have had terrible depression  History of Present Illness: Manuel [name] is a 23 yo male with past psychiatric history significant for major depressive disorder and medical history significant for Crohn's disease s/p multiple bowel obstructions resulting in bowel resection, family history of aortic dissection, long term use of opioids, and most recently, sensorineural hearing loss and severe worsening of his 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  Manuel [name] reports that he has been having difficulties since approximately March of 2025 when he suffered from a bowel obstruction leading to a necessary bowel resection for his Crohn's disease.  Since that time, he has had worsening obsessive thoughts about his decade long problem of tinnitus. He reports that this has caused severe impairment in his life, particularly since the significant worsening of his tinnitus occurred in September of 2025, leading to the loss of the hobbies he's been most passionate about (music and his hobby of playing the video game counter-strike with friends). He has had severe impairments in his work life too, he blames some of his worsening tinnitus on his professional work as an physiological scientist, in which his exposure to loud sounds exacerbated the problem. He reports that he now has a constant sound in his R ear that is equivalent in volume to a 70 decibel sound. This impairs not only his hearing but also his concentration and social life.  Beyond this, it has led him to begin to  read obsessively about the problem, including exhaustive personal research on the medical literature about tinnitus. He reports that he'll often read the same article multiple times late into the night as though hoping for some new insight. He reports frequent rumination and self-recrimination about his past habits of ignoring volume warnings and listening to loud music/video games. This inward chastisement has led to worsening depressive and anxiety symptoms as below.  He has turned to increasing substance use in order to deal with the constant annoyance of these thoughts. He has had 3x weekly cannabis use (increase), frequent kratom use, and has needed (prescribed) clonazepam in order to deal with nightly panic attacks.  Anxiety and depression about his tinnitus has led to demoralization about his job, contributing to his decision recently to quit his job and move back in with his parents in [place]. The patient also cites difficulties in his relationship with his girlfriend Manuel [name] as his symptoms of depression and anxiety have worsened.  The patient was recommended to come in when his family discovered how frequently he was using kratom to self-medicate.  Associated Signs/Symptoms: Depression Symptoms:  depressed mood, anhedonia, insomnia, feelings of worthlessness/guilt, difficulty concentrating, hopelessness, anxiety, panic attacks, loss of energy/fatigue, disturbed sleep, (Hypo) Manic Symptoms:  denies Anxiety Symptoms:  Excessive Worry, Panic Symptoms, Obsessive Compulsive Symptoms:   Checking Reading- and re-reading of literature in a way that detracts from his quality of life, Psychotic Symptoms:  na PTSD Symptoms: denies Total Time spent with patient: 45 minutes  Past Psychiatric History:   Previous Psych Diagnoses: Suicidal ideations, anxiety, MDD, Asperger Syndrome, OCD, anxiety and depression.  Prior inpatient treatment: [facility] trips x2 Current/prior outpatient  treatment: sees Manuel Manuel [name]  Prior rehab hx: denies Psychotherapy hx: denies, has appt 11/12 History of suicide: denies, collateral denies History of homicide or aggression: denies, collateral denies Psychiatric medication history: lexapro, trazodone , ketamine, olanzapine, methocarbamol  Psychiatric medication compliance history: poor per mother (hx from [facility]) Neuromodulation history: denies  Current Psychiatrist: Dr [name] Current therapist: starting with tinnitus therapist 11/12  Substance Abuse Hx: Alcohol: tried, but has never used consistently. Does not enjoy Tobacco: vapes nicotine  hourly Illicit drugs:  Marijuana/THC: 3x a week Cocaine: denies Methamphetamines: denies Benzodiazepines: denies Opioids: patient gives inconsistent history on this.  Kratom: yes, patient reports escalating recent history of kratom use Hallucinogens: denies Bath salts: denies Prescription Drug abuse problems:   Past Medical History: Medical Diagnoses: Crohns Disease on long term biologic therapy, status post gut resection in 12/2023, marfanoid hyperflexibility, sensorineural hearing loss of right ear. Family history of aortic dissection. Some old notes imply Marfan's syndrome? Home Rx: see below Prior Hosp: multiple for gut-related problems Prior Surgeries/Trauma: small bowel resection 12/2023 Head trauma, LOC, concussions, seizures: denies Allergies: denies  Family History: Medical: Family history of aortic dissection Psych: patient unaware.  Psych Rx: none known SA/HA: denies Substance use family hx: denies  Social History: Childhood (bring, raised, lives now, parents, siblings, schooling, education): Abuse: denies Marital Status: never married Sexual orientation: Children: denies Education: medical laboratory scientific officer from [employer] Employment: recently quit position as physiological scientist Peer Group: has several close friends that he stays in touch with from high school and  university Housing: Patient currently lives with mother. Firearms: No access to guns.  Finances: mild strain, unemployed Legal: no pending issues. No historic issues Military: denies affiliation   Is the patient at risk to self? No.  Has the patient been a risk to self in the past 6 months? Yes.    Has the patient been a risk to self within the distant past? No.  Is the patient a risk to others? No.  Has the patient been a risk to others in the past 6 months? No.  Has the patient been a risk to others within the distant past? No.   Columbia Scale:  Flowsheet Row [facility] (Current[facility] from [facility] from 08/14/2024 in [facility]  C-SSRS RISK CATEGORY Error: Q3, 4, or 5 should not be populated when Q2 is No Low Risk Low Risk     Prior Inpatient Therapy: No.  Prior Outpatient Therapy: No.    Alcohol Screening: 1. How often do you have a drink containing alcohol?: Never 2. How many drinks containing alcohol do you have on a typical day when you are drinking?: 1 or 2 3. How often do you have six or more drinks on one occasion?: Never AUDIT-C Score: 0 4. How often during the last year have you found that you were not able to stop drinking once you had started?: Never 5. How often during the last year have you failed to do what was normally expected from you because of drinking?: Never 6. How often during the last year have you needed a first drink in the morning to get yourself going after a heavy drinking session?: Never 7. How often during the last year have you had a feeling of guilt of remorse after drinking?: Never 8. How often during the last year have you been unable to remember what happened the night before because you had been drinking?: Never 9. Have you  or someone else been injured as a result of your drinking?: No 10. Has a relative or  friend or a doctor or another health worker been concerned about your drinking or suggested you cut down?: No Alcohol Use Disorder Identification Test Final Score (AUDIT): 0 Alcohol Brief Interventions/Follow-up: Alcohol education/Brief advice Substance Abuse History in the last 12 months:  Yes.   Consequences of Substance Abuse: Withdrawal Symptoms:   Cramps Diarrhea Previous Psychotropic Medications: No  Psychological Evaluations: No  Past Medical History:  Past Medical History:  Diagnosis Date   Allergy    Anxiety    Constipation since about age 53   Depression    Eczema     Past Surgical History:  Procedure Laterality Date   BOWEL RESECTION  07/2015   COLONOSCOPY N/A 12/29/2012   Procedure: COLONOSCOPY;  Surgeon: Fairy VEAR Gaskins, MD;  Location: [facility] OR;  Service: Gastroenterology;  Laterality: N/A;   COLONOSCOPY N/A 12/29/2012   Procedure: COLONOSCOPY;  Surgeon: Fairy VEAR Gaskins, MD;  Location: [facility] OR;  Service: Gastroenterology;  Laterality: N/A;   ESOPHAGOGASTRODUODENOSCOPY N/A 12/29/2012   Procedure: ESOPHAGOGASTRODUODENOSCOPY (EGD);  Surgeon: Fairy VEAR Gaskins, MD;  Location: [facility] OR;  Service: Gastroenterology;  Laterality: N/A;   ESOPHAGOGASTRODUODENOSCOPY Bilateral 12/29/2012   Procedure: ESOPHAGOGASTRODUODENOSCOPY (EGD);  Surgeon: Fairy VEAR Gaskins, MD;  Location: [facility] OR;  Service: Gastroenterology;  Laterality: Bilateral;   Family History:  Family History  Problem Relation Age of Onset   Depression Father    Birth defects Maternal Grandmother    Alcohol abuse Maternal Grandfather    Birth defects Maternal Grandfather     Tobacco Screening:  Social History   Tobacco Use  Smoking Status Every Day   Current packs/day: 1.00   Types: Cigarettes  Smokeless Tobacco Former   Quit date: 08/2024  Tobacco Comments   Vapes tobacco every day      uses THC since 23 yrs old, occasionally twice week    BH Tobacco Counseling     Are you interested in Tobacco Cessation Medications?  Yes,  implement Nicotene Replacement Protocol Counseled patient on smoking cessation:  Yes Reason Tobacco Screening Not Completed: No value filed.       Social History:  Social History   Substance and Sexual Activity  Alcohol Use Never   Alcohol/week: 8.0 standard drinks of alcohol   Types: 8 Cans of beer per week   Comment: pt stated during admission that he does not use alcohol     Social History   Substance and Sexual Activity  Drug Use Yes   Types: Marijuana, Other-see comments   Comment: kratum for 8 days, never used kratum before, anxious about tinnitis    Additional Social History: Marital status: Single Are you sexually active?: Yes What is your sexual orientation?: Straight Has your sexual activity been affected by drugs, alcohol, medication, or emotional stress?: No Does patient have children?: No                         Allergies:  No Known Allergies Lab Results: No results found for this or any previous visit (from the past 48 hours).  Blood Alcohol level:  Lab Results  Component Value Date   [facility] <15 08/27/2024    Metabolic Disorder Labs:  Lab Results  Component Value Date   HGBA1C 4.7 (L) 08/14/2024   MPG 88.19 08/14/2024   No results found for: PROLACTIN Lab Results  Component Value Date   CHOL 136 08/14/2024  TRIG 115 08/14/2024   HDL 57 08/14/2024   CHOLHDL 2.4 08/14/2024   VLDL 23 08/14/2024   LDLCALC 56 08/14/2024   LDLCALC 59 12/29/2012    Current Medications: Current Facility-Administered Medications  Medication Dose Route Frequency Provider Last Rate Last Admin   acetaminophen  (TYLENOL ) tablet 650 mg  650 mg Oral Q6H PRN Olasunkanmi, Oluwatosin, NP       alum & mag hydroxide-simeth (MAALOX/MYLANTA) 200-200-20 MG/5ML suspension 30 mL  30 mL Oral Q4H PRN Olasunkanmi, Oluwatosin, NP       cloNIDine (CATAPRES) tablet 0.1 mg  0.1 mg Oral BH-qamhs Olasunkanmi, Oluwatosin, NP       Followed by   NOREEN ON 09/02/2024] cloNIDine  (CATAPRES) tablet 0.1 mg  0.1 mg Oral QAC breakfast Olasunkanmi, Oluwatosin, NP       dicyclomine (BENTYL) tablet 20 mg  20 mg Oral Q6H PRN Olasunkanmi, Oluwatosin, NP       haloperidol (HALDOL) tablet 5 mg  5 mg Oral TID PRN Olasunkanmi, Oluwatosin, NP       And   diphenhydrAMINE (BENADRYL) capsule 50 mg  50 mg Oral TID PRN Olasunkanmi, Oluwatosin, NP       diphenhydrAMINE (BENADRYL) capsule 50 mg  50 mg Oral Q8H PRN Olasunkanmi, Oluwatosin, NP       haloperidol lactate (HALDOL) injection 5 mg  5 mg Intramuscular TID PRN Olasunkanmi, Oluwatosin, NP       And   diphenhydrAMINE (BENADRYL) injection 50 mg  50 mg Intramuscular TID PRN Olasunkanmi, Oluwatosin, NP       And   LORazepam (ATIVAN) injection 2 mg  2 mg Intramuscular TID PRN Olasunkanmi, Oluwatosin, NP       haloperidol lactate (HALDOL) injection 10 mg  10 mg Intramuscular TID PRN Olasunkanmi, Oluwatosin, NP       And   diphenhydrAMINE (BENADRYL) injection 50 mg  50 mg Intramuscular TID PRN Olasunkanmi, Oluwatosin, NP       And   LORazepam (ATIVAN) injection 2 mg  2 mg Intramuscular TID PRN Olasunkanmi, Oluwatosin, NP       hydrOXYzine (ATARAX) tablet 25 mg  25 mg Oral Q6H PRN Olasunkanmi, Oluwatosin, NP   25 mg at 08/30/24 1539   loperamide (IMODIUM) capsule 2-4 mg  2-4 mg Oral PRN Olasunkanmi, Oluwatosin, NP       magnesium hydroxide (MILK OF MAGNESIA) suspension 30 mL  30 mL Oral Daily PRN Olasunkanmi, Oluwatosin, NP       methocarbamol  (ROBAXIN ) tablet 500 mg  500 mg Oral Q8H PRN Olasunkanmi, Oluwatosin, NP       naproxen (NAPROSYN) tablet 500 mg  500 mg Oral BID PRN Olasunkanmi, Oluwatosin, NP       nicotine  (NICODERM CQ  - dosed in mg/24 hours) patch 21 mg  21 mg Transdermal Daily Prentis Kitchens A, DO   21 mg at 08/30/24 0816   nicotine  polacrilex (NICORETTE) gum 2 mg  2 mg Oral PRN Prentis Kitchens A, DO   2 mg at 08/30/24 1649   OLANZapine zydis (ZYPREXA) disintegrating tablet 10 mg  10 mg Oral QHS Olasunkanmi, Oluwatosin, NP        OLANZapine zydis (ZYPREXA) disintegrating tablet 5 mg  5 mg Oral Daily Olasunkanmi, Oluwatosin, NP       ondansetron  (ZOFRAN -ODT) disintegrating tablet 4 mg  4 mg Oral Q6H PRN Olasunkanmi, Oluwatosin, NP       traZODone  (DESYREL ) tablet 50 mg  50 mg Oral QHS PRN Olasunkanmi, Oluwatosin, NP   50 mg at 08/30/24 [phone]  PTA Medications: Medications Prior to Admission  Medication Sig Dispense Refill Last Dose/Taking   Acetylcysteine (NAC PO) Take 1 tablet by mouth daily.      Buprenorphine HCl-Naloxone HCl 8-2 MG FILM Place 1 Film under the tongue in the morning and at bedtime. (Patient not taking: Reported on 08/28/2024)      clonazePAM (KLONOPIN) 0.5 MG tablet Take 0.5 mg by mouth daily.      cloNIDine (CATAPRES) 0.1 MG tablet Take 0.1 mg by mouth every 8 (eight) hours as needed.      EPINEPHrine (ADRENALIN) 1 MG/ML SOLN injection Inject 0.3 mg into the muscle See admin instructions. Re-initiate treatment only upon physician approval (Patient not taking: Reported on 08/15/2024)      fluvoxaMINE (LUVOX) 25 MG tablet Take 25 mg by mouth daily. (Patient not taking: Reported on 08/28/2024)      gabapentin (NEURONTIN) 300 MG capsule Take 300 mg by mouth daily.      inFLIXimab-dyyb (INFLECTRA IV) Inject 370 mg into the vein See admin instructions. Inject into venous catheter.      Multiple Vitamins-Minerals (MULTIVITAMIN MEN PO) Take 1 Dose by mouth daily. Take 1 gummy by mouth daily.      OLANZapine (ZYPREXA) 10 MG tablet Take 10 mg by mouth at bedtime. (Patient not taking: Reported on 08/28/2024)      OLANZapine (ZYPREXA) 5 MG tablet Take 5 mg by mouth daily. (Patient not taking: Reported on 08/28/2024)      ONDANSETRON  PO Take 1 tablet by mouth See admin instructions. Frequency and dose unspecified (Patient not taking: Reported on 08/28/2024)      PREDNISONE  PO Take 1 Dose by mouth See admin instructions. Frequency unspecified (Patient not taking: Reported on 08/28/2024)      Risankizumab-rzaa (SKYRIZI  PEN [employer]) Inject 1 Dose into the skin See admin instructions. Frequency unknown (Patient not taking: Reported on 08/28/2024)      SPRAVATO, 84 MG DOSE, 28 MG/DEVICE SOPK Place 3 sprays into both nostrils See admin instructions. Frequency unspecified (Patient not taking: Reported on 08/28/2024)       AIMS:  ,  ,  ,  ,  ,  ,    Musculoskeletal: Strength & Muscle Tone: within normal limits Gait & Station: normal Patient leans: N/A  Psychiatric Specialty Exam: Mental Status Exam: General Appearance and Behavior: Tall, thin, pale caucasian young adult male. Sparse facial hair wearing a immunologist. His hair is unkempt  Orientation:  Full (Time, Place, and Person)  Memory:  Grossly intact  Attention: Fair  Eye Contact:  Good  Speech:  Clear and Coherent and rapid but not pressured  Language:  Good  Volume:  Normal  Mood: I feel all over the place  Affect:  Appropriate and Full Range  Thought Process:  Disorganized  Thought Content:  Obsessions, Rumination, and Tangential  Suicidal Thoughts:  No  Homicidal Thoughts:  No  Judgement:  Fair  Insight:  Fair  Psychomotor Activity:  Normal  Akathisia:  No  Fund of Knowledge:  Good Assets:  Communication Skills Desire for Improvement Financial Resources/Insurance Housing Intimacy Leisure Time Physical Health Resilience Social Support  Cognition:  WNL  ADL's:  Intact    Details about paranoia, delusions, or hallucinations:  Assets  Assets: Housing; Social Support; Communication Skills   Sleep  Sleep: Sleep: Fair  Estimated Sleeping Duration (Last 24 Hours): 3.00-3.75 hours (Due to [employer] Time, the durations displayed may not accurately represent documentation during the time change interval)  Physical Exam: Physical Exam Vitals reviewed.  Constitutional:      Appearance: Normal appearance.  HENT:     Head: Normocephalic and atraumatic.     Nose: No congestion.  Skin:    General: Skin is warm  and dry.     Coloration: Skin is pale.  Neurological:     Mental Status: He is alert and oriented to person, place, and time.     Sensory: Sensory deficit present.  Psychiatric:        Attention and Perception: Attention and perception normal.        Mood and Affect: Mood is anxious.        Speech: Speech is tangential.        Behavior: Behavior normal. Behavior is cooperative.        Thought Content: Thought content is not paranoid or delusional. Thought content does not include homicidal or suicidal ideation.        Cognition and Memory: Cognition and memory normal.        Judgment: Judgment is impulsive.    Review of Systems  Constitutional:  Negative for chills, diaphoresis, fever, malaise/fatigue and weight loss.  HENT:  Positive for hearing loss and tinnitus.   Respiratory:  Negative for cough.   Cardiovascular:  Negative for chest pain.  Gastrointestinal:  Negative for abdominal pain, constipation, diarrhea, nausea and vomiting.  Genitourinary:  Negative for urgency.  Psychiatric/Behavioral:  Positive for depression and substance abuse. Negative for hallucinations and suicidal ideas. The patient is nervous/anxious and has insomnia.    Blood pressure 106/70, pulse (!) 48, temperature 97.7 F (36.5 C), temperature source Oral, resp. rate 16, height 6' 2 (1.88 m), weight 73.5 kg, SpO2 100%. Body mass index is 20.8 kg/m.  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  Manuel [name] presentation meets full diagnostic criteria for multiple issues, including major depressive disorder (which he may have had for years), obsessive  compulsive disorder (which appears to have started in March 2025 after gut resection), and adjustment disorder with panic attacks (since approximately September after the acute worsening of his sensorineural hearing loss).He also meets criteria for cannabis use disorder, nicotine  use disorder, and while there is no formal classification, kratom use disorder.  Patient has had a debilitating mental health deterioration leading to loss of job, loss of independence, and worsening obsessions and rumination on his loss.  Patient is unwilling to use SSRI medications (first line in OCD, depression, and anxiety) due to the rare but documented phenomenon of worsening tinnitus. However, he continues to self-medicate using multiple substances which commonly worsen tinnitus (benzodiazepines though prescribed can worsen tinnitus, as well as nicotine  and cannabis). Per literature review, there is insufficient evidence to conclude about the effects of kratom.  Treatment Plan Summary: Daily contact with patient to assess and evaluate symptoms and progress in treatment and Medication management  Observation Level/Precautions:  15 minute checks  Laboratory:  Vitamin B-12 Vitamin D  Psychotherapy:  Patient requires CBT for Tinnitus, OCD  Medications:   Discontinue zyprexa Decrease home clonazepam from 0.5 mg at bedtime to 0.25 mg at bedtime Continue methocarbamol  500 mg Q8PRN for muscle spasms Start saccaromyces boulardii (florastor probiotic)  250 mg BID for gut biota improvement (depression) Continue hydroxyzine 25 mg Q6 PRN for anxiety  Consultations:  TMS consultation for depression, anxiety, tinnitus  Discharge Concerns:    Estimated LOS:  Other:  Consider workup for Marfan's syndrome   Physician Treatment Plan for Primary Diagnosis: Major depressive disorder, recurrent episode, moderate degree (HCC) Long Term Goal(s): Improvement in symptoms so as ready for discharge  Short Term Goals: Ability to  identify changes in lifestyle to reduce recurrence of condition will improve, Ability to verbalize feelings will improve, Ability to disclose and discuss suicidal ideas, Ability to demonstrate self-control will improve, Ability to identify and develop effective coping behaviors will improve, Compliance with prescribed medications will improve, and Ability to identify triggers associated with substance abuse/mental health issues will improve  Physician Treatment Plan for Secondary Diagnosis: Principal Problem:   Major depressive disorder, recurrent episode, moderate degree (HCC)  Long Term Goal(s): Improvement in symptoms so as ready for discharge  Short Term Goals: Ability to identify changes in lifestyle to reduce recurrence of condition will improve, Ability to verbalize feelings will improve, Ability to disclose and discuss suicidal ideas, Ability to demonstrate self-control will improve, and Ability to identify and develop effective coping behaviors will improve  I certify that inpatient services furnished can reasonably be expected to improve the patient's condition.    Lynwood Morene Lavone Delsie, MD 11/6/20256:02 PM

## 2024-08-30 NOTE — [facility] Group Notes (Signed)
 [facility] Group Notes:  (Nursing/MHT/Case Management/Adjunct)  Date:  08/30/2024  Time:  10:59 PM  Type of Therapy:  Wrap-up group  Participation Level:  Active  Participation Quality:  Appropriate  Affect:  Appropriate  Cognitive:  Appropriate  Insight:  Appropriate  Engagement in Group:  Engaged  Modes of Intervention:  Education  Summary of Progress/Problems: Goal to do some self reflection. Rated day 8/10.  Manuel [name] 08/30/2024, 10:59 PM

## 2024-08-30 NOTE — Plan of Care (Signed)
   Problem: Education: Goal: Knowledge of [facility] Education information/materials will improve Outcome: Progressing   Problem: Activity: Goal: Interest or engagement in activities will improve Outcome: Progressing   Problem: Coping: Goal: Ability to verbalize frustrations and anger appropriately will improve Outcome: Progressing   Problem: Safety: Goal: Periods of time without injury will increase Outcome: Progressing

## 2024-08-30 NOTE — Progress Notes (Signed)
   08/30/24 1300  Psych Admission Type (Psych Patients Only)  Admission Status Voluntary  Psychosocial Assessment  Patient Complaints Anxiety  Eye Contact Fair  Facial Expression Worried;Anxious  Affect Anxious  Speech Logical/coherent  Interaction Assertive  Motor Activity Fidgety  Appearance/Hygiene Unremarkable  Behavior Characteristics Cooperative;Calm  Mood Depressed;Anxious  Thought Process  Coherency WDL  Content Blaming others  Delusions None reported or observed  Perception WDL  Hallucination None reported or observed  Judgment Poor  Confusion None  Danger to Self  Current suicidal ideation? Denies  Self-Injurious Behavior No self-injurious ideation or behavior indicators observed or expressed   Agreement Not to Harm Self Yes  Description of Agreement verbal  Danger to Others  Danger to Others None reported or observed

## 2024-08-30 NOTE — Group Note (Signed)
 Date:  08/30/2024 Time:  10:38 AM  Group Topic/Focus:  Nutrition Group    Participation Level:  Active   Manuel [name] Manuel [name] 08/30/2024, 10:38 AM

## 2024-08-30 NOTE — Plan of Care (Signed)
   Problem: Education: Goal: Emotional status will improve Outcome: Progressing Goal: Mental status will improve Outcome: Progressing Goal: Verbalization of understanding the information provided will improve Outcome: Progressing

## 2024-08-30 NOTE — Progress Notes (Signed)
 Patient woke up C/O anxiety and sleep disturbance refused Trazodone  stating I cannot  take Trazodone  it make my ear rings more. PRN Vistaril given for anxiety. Patient went back to bed

## 2024-08-30 NOTE — Group Note (Signed)
 LCSW Group Therapy Note   Group Date: 08/30/2024 Start Time: 1100 End Time: 1200   Participation:  patient was present and actively participated in the discussion.  Type of Therapy:  Group Therapy   Topic:  Stronger Together:  Building Healthy Relationships  Objective:  To explore loneliness, boundaries, and safe ways to build relationships.  Goals: - Recognize healthy vs. unhealthy relationships. - Learn safe ways to connect with others. - Psychiatrist.  Summary:  Participants discussed loneliness, healthy connections, and setting boundaries. They explored safe ways to meet people and shared personal experiences. Key insights were reinforced through discussion and quotes.  Therapeutic Modalities Used: - Cognitive Behavioral Therapy (CBT) Elements - Identifying unhealthy relationship patterns, challenging negative thoughts about connection. - Dialectical Behavior Therapy (DBT) Elements - Interpersonal effectiveness, setting and maintaining boundaries. - Supportive Group Therapy - Peer discussion, shared experiences, and emotional validation.   Clemie General O Alivea Gladson, LCSWA 08/30/2024  4:40 PM

## 2024-08-30 NOTE — Plan of Care (Signed)

## 2024-08-30 NOTE — [facility] Counselor (Signed)
 Adult Comprehensive Assessment  Patient ID: Manuel [name], male   DOB: 2000/12/23, 23 y.o.   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 I was feeling depressed about that. I was using Kratom for about a week because of it Patient states their goals for this hospitilization and ongoing recovery are:: I would like to have a therapist that focuses on tennitus Educational / Learning stressors: None reported Employment / Job issues: None reported Family Relationships: None reported Surveyor, Quantity / Lack of resources (include bankruptcy): None reported Housing / Lack of housing: None reported Physical health (include injuries & life threatening diseases): The ringing in my ear is what makes everything feel like a problem Social relationships: None reported Substance abuse: I didnt go through withdrawal because i didnt use that long Bereavement / Loss: None reported  Living/Environment/Situation:  Living Arrangements: Parent Living conditions (as described by patient or guardian): House Who else lives in the home?: Mom, dad, sister How long has patient lived in current situation?: January 2025 What is atmosphere in current home: Comfortable, Supportive  Family History:  Marital status: Single Are you sexually active?: Yes What is your sexual orientation?: Straight Has your sexual activity been affected by drugs, alcohol, medication, or emotional stress?: No Does patient have children?: No  Childhood History:  By whom was/is the patient raised?: Both parents Description of patient's relationship with caregiver when they were a child: Pretty good, I had a really good life growing up Patient's description of current relationship with people who raised him/her: It's still really good How were you disciplined when you  got in trouble as a child/adolescent?: Took away video games Does patient have siblings?: Yes Number of Siblings: 1 Description of patient's current relationship with siblings: It's good Did patient suffer any verbal/emotional/physical/sexual abuse as a child?: No Did patient suffer from severe childhood neglect?: No Has patient ever been sexually abused/assaulted/raped as an adolescent or adult?: No Was the patient ever a victim of a crime or a disaster?: No Witnessed domestic violence?: No Has patient been affected by domestic violence as an adult?: No  Education:  Highest grade of school patient has completed: Chief Operating Officer in Public Relations Account Executive Currently a consulting civil engineer?: No Learning disability?: No  Employment/Work Situation:   Employment Situation: Unemployed Patient's Job has Been Impacted by Current Illness: Yes Describe how Patient's Job has Been Impacted: Ringing in the ear What is the Longest Time Patient has Held a Job?: 2 years 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 Has Patient ever Been in Equities Trader?: No (I did have low level clearances because of my work)  Architect:   Surveyor, Quantity resources: Support from parents / caregiver, Media planner Does patient have a lawyer or guardian?: No  Alcohol/Substance Abuse:   What has been your use of drugs/alcohol within the last 12 months?: I smoke some weed If attempted suicide, did drugs/alcohol play a role in this?: No Alcohol/Substance Abuse Treatment Hx: Denies past history Has alcohol/substance abuse ever caused legal problems?: No  Social Support System:   Conservation Officer, Nature Support System: Good Describe Community Support System: Girlfriend and family Type of faith/religion: None reported How does patient's faith help to cope with current illness?: NA  Leisure/Recreation:   Do You Have Hobbies?: Yes Leisure and Hobbies: Gaming, watching TV, playing basketball,  golf  Strengths/Needs:   What  is the patient's perception of their strengths?: None reported Patient states they can use these personal strengths during their treatment to contribute to their recovery: None reported Patient states these barriers may affect/interfere with their treatment: None reported Patient states these barriers may affect their return to the community: None reported  Discharge Plan:   Currently receiving community mental health services: Yes (From Whom) Patient states concerns and preferences for aftercare planning are: Tennitis therapist (unsure of name) speciality program through [facility] and Dr. Lupta for [facility] in [place] Patient states they will know when they are safe and ready for discharge when: I feel like I just need a good therapist Does patient have access to transportation?: Yes Does patient have financial barriers related to discharge medications?: No Will patient be returning to same living situation after discharge?: Yes  Summary/Recommendations:   Summary and Recommendations (to be completed by the evaluator): Manuel [name] is a 24yo male who is voluntarily admitted to [facility] to [facility] due to kratom use/detox and 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 Pt endorses these as his only stressors currently stating the tinnitus is the cause for his depression. Not currently employed and not in school, does have an medical laboratory scientific officer and previously worked as an physiological scientist for 2 years. Lives at home with mom, dad, and sister. Endorses kratom use to help with the tinnitus for the past week and recently told his parents because he was afraid of going into withdrawal. Endorses marijuana use. Denies alcohol use or other substance use. Denies SI, HI and AVH. Currently has providers for therapy and medication management. [facility] Dr. Olam Baseman is his therapist who specializes in tinnitus. Dr. Lupta is his psychatrist through [facility] in the [place] office. Pt  will return home with family at discharge. While here, [place] can benefit from crisis stabilization, medication management, therapeutic milieu, and referrals for services.   Jenkins LULLA Primer. 08/30/2024

## 2024-08-30 NOTE — Group Note (Signed)
 Occupational Therapy Group Note  Group Topic: Sleep Hygiene  Group Date: 08/30/2024 Start Time: 1500 End Time: 1530 Facilitators: Dot [place] MATSU, OT   Group Description: Group encouraged increased participation and engagement through topic focused on sleep hygiene. Patients reflected on the quality of sleep they typically receive and identified areas that need improvement. Group was given background information on sleep and sleep hygiene, including common sleep disorders. Group members also received information on how to improve one's sleep and introduced a sleep diary as a tool that can be utilized to track sleep quality over a length of time. Group session ended with patients identifying one or more strategies they could utilize or implement into their sleep routine in order to improve overall sleep quality.        Therapeutic Goal(s):  Identify one or more strategies to improve overall sleep hygiene  Identify one or more areas of sleep that are negatively impacted (sleep too much, too little, etc)     Participation Level: Engaged   Participation Quality: Independent   Behavior: Appropriate   Speech/Thought Process: Relevant   Affect/Mood: Appropriate   Insight: Fair   Judgement: Fair      Modes of Intervention: Education  Patient Response to Interventions:  Attentive   Plan: Continue to engage patient in OT groups 2 - 3x/week.  08/30/2024  [place] MATSU Dot, OT   Ramey Ketcherside, OT

## 2024-08-30 NOTE — Group Note (Signed)
 Date:  08/30/2024 Time:  9:35 AM  Group Topic/Focus:  Emotional Education:   The focus of this group is to discuss what feelings/emotions are, and how they are experienced. Goals Group:   The focus of this group is to help patients establish daily goals to achieve during treatment and discuss how the patient can incorporate goal setting into their daily lives to aide in recovery. Orientation:   The focus of this group is to educate the patient on the purpose and policies of crisis stabilization and provide a format to answer questions about their admission.  The group details unit policies and expectations of patients while admitted.    Participation Level:  Active  Participation Quality:  Appropriate  Affect:  Appropriate  Cognitive:  Appropriate  Insight: Good  Engagement in Group:  Engaged  Modes of Intervention:  Discussion and Education  Additional Comments:  Pt actively participated in group.   Manuel [name] Molly 08/30/2024, 9:35 AM

## 2024-08-30 NOTE — [facility] Group Notes (Signed)
 Patient did attend OT group.

## 2024-08-31 ENCOUNTER — Encounter (HOSPITAL_COMMUNITY): Payer: Self-pay

## 2024-08-31 DIAGNOSIS — F411 Generalized anxiety disorder: Secondary | ICD-10-CM

## 2024-08-31 DIAGNOSIS — F1290 Cannabis use, unspecified, uncomplicated: Secondary | ICD-10-CM

## 2024-08-31 DIAGNOSIS — F1990 Other psychoactive substance use, unspecified, uncomplicated: Secondary | ICD-10-CM

## 2024-08-31 DIAGNOSIS — F429 Obsessive-compulsive disorder, unspecified: Principal | ICD-10-CM | POA: Diagnosis present

## 2024-08-31 LAB — VITAMIN D 25 HYDROXY (VIT D DEFICIENCY, FRACTURES): Vit D, 25-Hydroxy: 16.27 ng/mL — ABNORMAL LOW (ref 30–100)

## 2024-08-31 LAB — VITAMIN B12: Vitamin B-12: 411 pg/mL (ref 180–914)

## 2024-08-31 MED ORDER — TRAZODONE HCL 100 MG PO TABS
100.0000 mg | ORAL_TABLET | Freq: Every evening | ORAL | Status: DC | PRN
  Administered 2024-08-31 – 2024-09-02 (×3): 100 mg via ORAL
  Filled 2024-08-31 (×3): qty 1

## 2024-08-31 MED ORDER — VITAMIN D (ERGOCALCIFEROL) 1.25 MG (50000 UNIT) PO CAPS
50000.0000 [IU] | ORAL_CAPSULE | ORAL | Status: DC
  Administered 2024-09-01: 50000 [IU] via ORAL
  Filled 2024-08-31: qty 1

## 2024-08-31 MED ORDER — FLUVOXAMINE MALEATE 50 MG PO TABS
25.0000 mg | ORAL_TABLET | Freq: Two times a day (BID) | ORAL | Status: DC
  Administered 2024-08-31 – 2024-09-01 (×2): 25 mg via ORAL
  Filled 2024-08-31 (×2): qty 1

## 2024-08-31 MED ORDER — CLONAZEPAM 0.25 MG PO TBDP
0.3750 mg | ORAL_TABLET | Freq: Every day | ORAL | Status: DC
  Administered 2024-08-31 – 2024-09-02 (×3): 0.375 mg via ORAL
  Filled 2024-08-31 (×6): qty 1

## 2024-08-31 NOTE — Progress Notes (Addendum)
 [facility] MD Progress Note 08/31/2024 3:48 PM ILEY [name]  MRN:  [id number] Principal Problem: Major depressive disorder, recurrent episode, moderate degree (HCC) Diagnosis: Principal Problem:   Major depressive disorder, recurrent episode, moderate degree (HCC)   Subjective:   CC: I took way too much Kratom and I have had terrible depression   History of Present Illness: Manuel [name] is a 23 yo male with past psychiatric history significant for major depressive disorder and medical history significant for Crohn's disease s/p multiple bowel obstructions resulting in bowel resection, family history of aortic dissection, long term use of opioids, and most recently, sensorineural hearing loss and severe worsening of his 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  Chart Review from last 24 hours and discussion during bed progression: The patient's chart was reviewed and nursing notes were reviewed. The patient's case was discussed in multidisciplinary team meeting.   - events per chart review / staff report: none - Patient received all scheduled medications - Patient received the following PRN medications: Hydroxyzine, nicotine , trazodone   Per Patient:  On assessment today, the patient reports some improvement from yesterday.  He is extremely appreciative of the time that staff has devoted to working on his challenging issues.  Patient has many moments of reflection on how difficult it must be for others to try and talk to the rational part of me that is still left, by which he means the portion of his brain not devoted to thinking about tinnitus all the time.  Discussed with the patient that while the tinnitus is a problem, it is not THE problem that got him into this place.  We discussed the importance of conceptualizing this stay as a treatment for obsessive-compulsive disorder and major depressive disorder as a result of that obsession with the tinnitus.  Many people have tinnitus and do not  develop obsessive-compulsive disorder.  Sleep: Poor Appetite:  Fair Depression: still high Anxiety: high Auditory Hallucinations: denies, not seen responding Visual Hallucinations: denies, not seen responding Paranoia: denies HI: denies SI: denies Side effects from medications: does not endorse any side-effects they attribute to medications. Other concerns discussed with patient:  Review of Systems  Constitutional:  Negative for chills, diaphoresis, fever, malaise/fatigue and weight loss.  Respiratory:  Negative for cough.   Cardiovascular:  Negative for chest pain.  Gastrointestinal:  Negative for abdominal pain, constipation, diarrhea, nausea and vomiting.  Genitourinary:  Negative for urgency.  Psychiatric/Behavioral:  Positive for depression and substance abuse. Negative for hallucinations and suicidal ideas. The patient is nervous/anxious and has insomnia.     Total time spent with patient: 45 minutes  HX from H&P:  Previous Psych Diagnoses: Suicidal ideations, anxiety, MDD, Asperger Syndrome, OCD, anxiety and depression.  Prior inpatient treatment: [facility] trips x2 Current/prior outpatient treatment: sees Dr Ismael Franco Prior rehab hx: denies Psychotherapy hx: denies, has appt 11/12 History of suicide: denies, collateral denies History of homicide or aggression: denies, collateral denies Psychiatric medication history: lexapro, trazodone , ketamine, olanzapine, methocarbamol  Psychiatric medication compliance history: poor per mother (hx from [facility]) Neuromodulation history: denies  Current Psychiatrist: Dr Charlanne Current therapist: starting with tinnitus therapist 11/12   Substance Abuse Hx: Alcohol: tried, but has never used consistently. Does not enjoy Tobacco: vapes nicotine  hourly Illicit drugs:  Marijuana/THC: 3x a week Cocaine: denies Methamphetamines: denies Benzodiazepines: denies Opioids: patient gives inconsistent history on this.  Kratom: yes, patient  reports escalating recent history of kratom use Hallucinogens: denies Bath salts: denies Prescription Drug abuse problems:    Past  Medical History: Medical Diagnoses: Crohns Disease on long term biologic therapy, status post gut resection in 12/2023, marfanoid hyperflexibility, sensorineural hearing loss of right ear. Family history of aortic dissection. Some old notes imply Marfan's syndrome? Home Rx: see below Prior Hosp: multiple for gut-related problems Prior Surgeries/Trauma: small bowel resection 12/2023 Head trauma, LOC, concussions, seizures: denies Allergies: denies   Family History: Medical: Family history of aortic dissection Psych: patient unaware.  Psych Rx: none known SA/HA: denies Substance use family hx: denies   Social History: Childhood (bring, raised, lives now, parents, siblings, schooling, education): Abuse: denies Marital Status: never married Sexual orientation: Children: denies Education: medical laboratory scientific officer from [employer] Employment: recently quit position as physiological scientist Peer Group: has several close friends that he stays in touch with from high school and university Housing: Patient currently lives with mother. Firearms: No access to guns.  Finances: mild strain, unemployed Legal: no pending issues. No historic Museum/gallery Exhibitions Officer: denies affiliation     Past Medical History (auto-populated):  Past Medical History:  Diagnosis Date   Allergy    Anxiety    Constipation since about age 6   Depression    Eczema     Past Surgical History:  Procedure Laterality Date   BOWEL RESECTION  07/2015   COLONOSCOPY N/A 12/29/2012   Procedure: COLONOSCOPY;  Surgeon: Fairy VEAR Gaskins, MD;  Location: [facility] OR;  Service: Gastroenterology;  Laterality: N/A;   COLONOSCOPY N/A 12/29/2012   Procedure: COLONOSCOPY;  Surgeon: Fairy VEAR Gaskins, MD;  Location: [facility] OR;  Service: Gastroenterology;  Laterality: N/A;   ESOPHAGOGASTRODUODENOSCOPY N/A 12/29/2012    Procedure: ESOPHAGOGASTRODUODENOSCOPY (EGD);  Surgeon: Fairy VEAR Gaskins, MD;  Location: [facility] OR;  Service: Gastroenterology;  Laterality: N/A;   ESOPHAGOGASTRODUODENOSCOPY Bilateral 12/29/2012   Procedure: ESOPHAGOGASTRODUODENOSCOPY (EGD);  Surgeon: Fairy VEAR Gaskins, MD;  Location: [facility] OR;  Service: Gastroenterology;  Laterality: Bilateral;    Social History:  Social History   Substance and Sexual Activity  Alcohol Use Never   Alcohol/week: 8.0 standard drinks of alcohol   Types: 8 Cans of beer per week   Comment: pt stated during admission that he does not use alcohol     Social History   Substance and Sexual Activity  Drug Use Yes   Types: Marijuana, Other-see comments   Comment: kratum for 8 days, never used kratum before, anxious about tinnitis    Social History   Socioeconomic History   Marital status: Single    Spouse name: Not on file   Number of children: Not on file   Years of education: 16   Highest education level: Bachelor's degree (e.g., BA, AB, BS)  Occupational History   Occupation: Student  Tobacco Use   Smoking status: Every Day    Current packs/day: 1.00    Types: Cigarettes   Smokeless tobacco: Former    Quit date: 08/2024   Tobacco comments:    Vapes tobacco every day      uses THC since 23 yrs old, occasionally twice week  Vaping Use   Vaping status: Every Day   Last attempt to quit: 08/29/2024   Devices: vapes tobacco every day  Substance and Sexual Activity   Alcohol use: Never    Alcohol/week: 8.0 standard drinks of alcohol    Types: 8 Cans of beer per week    Comment: pt stated during admission that he does not use alcohol   Drug use: Yes    Types: Marijuana, Other-see comments    Comment: kratum for 8  days, never used kratum before, anxious about tinnitis   Sexual activity: Never  Other Topics Concern   Not on file  Social History Narrative   Student at [employer]   Financial Resource Strain: Not on file  Food  Insecurity: No Food Insecurity (08/29/2024)   Hunger Vital Sign    Worried About Running Out of Food in the Last Year: Never true    Ran Out of Food in the Last Year: Never true  Transportation Needs: No Transportation Needs (08/29/2024)   PRAPARE - Administrator, Civil Service (Medical): No    Lack of Transportation (Non-Medical): No  Physical Activity: Not on file  Stress: Not on file  Social Connections: Not on file   Additional Social History:   Objective: Medications, Labs, Mental Status Exam, Physical Exam Current Medications: Current Facility-Administered Medications  Medication Dose Route Frequency Provider Last Rate Last Admin   acetaminophen  (TYLENOL ) tablet 650 mg  650 mg Oral Q6H PRN Olasunkanmi, Oluwatosin, NP       alum & mag hydroxide-simeth (MAALOX/MYLANTA) 200-200-20 MG/5ML suspension 30 mL  30 mL Oral Q4H PRN Olasunkanmi, Oluwatosin, NP       clonazepam (KLONOPIN) disintegrating tablet 0.375 mg  0.375 mg Oral QHS Delsie Lynwood Morene Lavone, MD       cloNIDine (CATAPRES) tablet 0.1 mg  0.1 mg Oral BH-qamhs Olasunkanmi, Oluwatosin, NP       Followed by   NOREEN ON 09/02/2024] cloNIDine (CATAPRES) tablet 0.1 mg  0.1 mg Oral QAC breakfast Olasunkanmi, Oluwatosin, NP       dicyclomine (BENTYL) tablet 20 mg  20 mg Oral Q6H PRN Olasunkanmi, Oluwatosin, NP       haloperidol (HALDOL) tablet 5 mg  5 mg Oral TID PRN Olasunkanmi, Oluwatosin, NP       And   diphenhydrAMINE (BENADRYL) capsule 50 mg  50 mg Oral TID PRN Olasunkanmi, Oluwatosin, NP       diphenhydrAMINE (BENADRYL) capsule 50 mg  50 mg Oral Q8H PRN Olasunkanmi, Oluwatosin, NP       haloperidol lactate (HALDOL) injection 5 mg  5 mg Intramuscular TID PRN Olasunkanmi, Oluwatosin, NP       And   diphenhydrAMINE (BENADRYL) injection 50 mg  50 mg Intramuscular TID PRN Olasunkanmi, Oluwatosin, NP       And   LORazepam (ATIVAN) injection 2 mg  2 mg Intramuscular TID PRN Olasunkanmi, Oluwatosin, NP        haloperidol lactate (HALDOL) injection 10 mg  10 mg Intramuscular TID PRN Olasunkanmi, Oluwatosin, NP       And   diphenhydrAMINE (BENADRYL) injection 50 mg  50 mg Intramuscular TID PRN Olasunkanmi, Oluwatosin, NP       And   LORazepam (ATIVAN) injection 2 mg  2 mg Intramuscular TID PRN Olasunkanmi, Oluwatosin, NP       fluvoxaMINE (LUVOX) tablet 25 mg  25 mg Oral BID Delsie Lynwood Morene Lavone, MD       hydrOXYzine (ATARAX) tablet 25 mg  25 mg Oral Q6H PRN Olasunkanmi, Oluwatosin, NP   25 mg at 08/30/24 2105   loperamide (IMODIUM) capsule 2-4 mg  2-4 mg Oral PRN Olasunkanmi, Oluwatosin, NP       magnesium hydroxide (MILK OF MAGNESIA) suspension 30 mL  30 mL Oral Daily PRN Olasunkanmi, Oluwatosin, NP       methocarbamol  (ROBAXIN ) tablet 500 mg  500 mg Oral Q8H PRN Olasunkanmi, Oluwatosin, NP  naproxen (NAPROSYN) tablet 500 mg  500 mg Oral BID PRN Olasunkanmi, Oluwatosin, NP       nicotine  (NICODERM CQ  - dosed in mg/24 hours) patch 21 mg  21 mg Transdermal Daily Prentis Kitchens A, DO   21 mg at 08/31/24 9245   nicotine  polacrilex (NICORETTE) gum 2 mg  2 mg Oral PRN Prentis Kitchens A, DO   2 mg at 08/31/24 0754   ondansetron  (ZOFRAN -ODT) disintegrating tablet 4 mg  4 mg Oral Q6H PRN Olasunkanmi, Oluwatosin, NP       saccharomyces boulardii (FLORASTOR) capsule 250 mg  250 mg Oral BID Delsie Lynwood Morene Lavone, MD       traZODone  (DESYREL ) tablet 50 mg  50 mg Oral QHS PRN Olasunkanmi, Oluwatosin, NP   50 mg at 08/30/24 2105    Lab Results:  Results for orders placed or performed during the hospital encounter of 08/29/24 (from the past 48 hours)  Vitamin B12     Status: None   Collection Time: 08/31/24  6:12 AM  Result Value Ref Range   Vitamin B-12 411 180 - 914 pg/mL    Comment: Performed at [facility], [address], [place], [place] [address]  VITAMIN D 25 Hydroxy (Vit-D Deficiency, Fractures)     Status: Abnormal   Collection Time: 08/31/24  6:12 AM   Result Value Ref Range   Vit D, 25-Hydroxy 16.27 (L) 30 - 100 ng/mL    Comment: (NOTE) Vitamin D deficiency has been defined by the Institute of Medicine  and an Endocrine Society practice guideline as a level of serum 25-OH  vitamin D less than 20 ng/mL (1,2). The Endocrine Society went on to  further define vitamin D insufficiency as a level between 21 and 29  ng/mL (2).  1. IOM (Institute of Medicine). 2010. Dietary reference intakes for  calcium and D. [place]: [employer]. 2. Holick MF, Binkley Monaca, Bischoff-Ferrari HA, et al. Evaluation,  treatment, and prevention of vitamin D deficiency: an Endocrine  Society clinical practice guideline, JCEM. 2011 Jul; 96(7): 1911-30.  Performed at [facility], [address], [place], [place] [address]     Blood Alcohol level:  Lab Results  Component Value Date   [facility] <15 08/27/2024    Metabolic Disorder Labs: Lab Results  Component Value Date   HGBA1C 4.7 (L) 08/14/2024   MPG 88.19 08/14/2024   No results found for: PROLACTIN Lab Results  Component Value Date   CHOL 136 08/14/2024   TRIG 115 08/14/2024   HDL 57 08/14/2024   CHOLHDL 2.4 08/14/2024   VLDL 23 08/14/2024   LDLCALC 56 08/14/2024   LDLCALC 59 12/29/2012    Physical Findings: AIMS:  , ,  ,  ,    CIWA:  CIWA-Ar Total: 4 COWS:  COWS Total Score: 1  Musculoskeletal: Strength & Muscle Tone: within normal limits Gait & Station: normal Patient leans: N/A   Psychiatric Specialty Exam: Mental Status Exam: General Appearance and Behavior: Tall, thin, pale caucasian young adult male.  Patient does not appear to have shaved or showered in the last 24 hours.  He appears extremely anxious  Orientation:  Full (Time, Place, and Person)  Memory:  Grossly intact  Attention: Good  Eye Contact:  Good  Speech:  Clear and Coherent and rapid but not pressured  Language:  Good  Volume:  Normal  Mood: I feel very anxious about a lot of  different things  Affect:  Appropriate  and Full Range  Thought Process: More linear than yesterday, though patient describes himself as less than organized than yesterday  Thought Content:  Obsessions (tinnitus), Rumination (every possible thing that he has done that could have contributed to his tinnitus), and Tangential (all things go back to tinnitus)  Suicidal Thoughts:  No  Homicidal Thoughts:  No  Judgement:  Fair  Insight: Shallow  Psychomotor Activity:  Normal  Akathisia:  No  Fund of Knowledge:  Good Assets:  Communication Skills Desire for Improvement Financial Resources/Insurance Housing Intimacy Leisure Time Physical Health Resilience Social Support  Cognition:  WNL  ADL's:  Intact    Details about paranoia, delusions, or hallucinations: Physical Exam: Physical Exam Vitals and nursing note reviewed.  Constitutional:      General: He is not in acute distress.    Appearance: Normal appearance. He is not ill-appearing.  HENT:     Head: Normocephalic and atraumatic.  Pulmonary:     Effort: Pulmonary effort is normal.  Skin:    General: Skin is warm and dry.  Neurological:     General: No focal deficit present.     Mental Status: He is alert and oriented to person, place, and time.     Sensory: No sensory deficit.  Psychiatric:        Behavior: Behavior normal.     Blood pressure (!) 104/59, pulse (!) 51, temperature 98 F (36.7 C), temperature source Oral, resp. rate 18, height 6' 2 (1.88 m), weight 73.5 kg, SpO2 100%. Body mass index is 20.8 kg/m.  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   Mr  Kuba presentation meets full diagnostic criteria for multiple issues, including major depressive disorder (which he may have had for years), obsessive compulsive disorder (which appears to have started in March 2025 after gut resection), and adjustment disorder with panic attacks (since approximately September after the acute worsening of his sensorineural hearing loss).He also meets criteria for cannabis use disorder, nicotine  use disorder, and while there is no formal classification, kratom use disorder.   Patient has had a debilitating mental health deterioration leading to loss of job, loss of independence, and worsening obsessions and rumination on his loss.   11/7: Patient reports he is doing better overall.  There are some signs that this is the case indicating improved concentration during discussions, less tangents, ability to be redirected onto the topic.  Patient needs to constantly have this hospitalization reframed as treating for obsessive-compulsive disorder, not the tinnitus.  Patient requires significant reassurance, and is extremely knowledgeable and intelligent.  Needs and wants hard facts and data but has a tendency to ruminate.  Critical to be clear and direct before you present a plan to him.  Diagnoses / Active Problems: Obsessive-compulsive disorder Major depressive disorder without psychotic features Generalized anxiety disorder Cannabis use disorder Other unspecified substance use disorder (kratom)  PLAN: Safety and Monitoring:  --  Voluntary admission to inpatient psychiatric unit for safety, stabilization and treatment  -- Daily contact with patient to assess and evaluate symptoms and progress in treatment  -- Patient's case to be discussed in multi-disciplinary team meeting  -- Observation Level : q15 minute checks  -- Vital signs:  q12 hours  -- Precautions: suicide, elopement, and assault  2. Psychiatric Diagnoses and Treatment:  Increase clonazepam from  0.25 mg to 0.375 mg at bedtime for nighttime panic attacks Continue methocarbamol  500 mg Q8PRN for muscle spasms Start saccaromyces boulardii (florastor probiotic) 250 mg BID for gut biota improvement (depression) Increase trazodone  from 50 mg to 100 mg nightly for sleep Continue hydroxyzine 25 mg Q6 PRN for anxiety Start fluvoxamine 25 mg twice daily for obsessive-compulsive disorder -plan to increase this to 25 mg in the morning 50 mg at night before discharge on 50 twice daily    The risks/benefits/side-effects/alternatives to this medication were discussed in detail with the patient and time was given for questions. The patient consents to medication trial.  Metabolic profile and EKG monitoring obtained while on an atypical antipsychotic Body mass index is 20.8 kg/m. Lipid Panel: Within normal limits on 08/14/24 Hbg A1c: 4.7 (Within normal limits) on 08/14/24 QTc: 373 ms, last obtained 08/29/2024 Encouraged patient to participate in unit milieu and in scheduled group therapies   -- Short Term Goals: Ability to identify changes in lifestyle to reduce recurrence of condition will improve, Ability to verbalize feelings will improve, Ability to demonstrate self-control will improve, and Ability to identify and develop effective coping behaviors will improve  -- Long Term Goals: Improvement in symptoms so as ready for discharge    3. Medical Issues Being Addressed:  Crohn's disease Immunosuppression (long-term use of Biologics) Sensorineural hearing loss of Right ear Tinnitus, chronic Labs reviewed, notable for low vitamin D, otherwise unremarkable  Tobacco Use Disorder  --  Patient in need of nicotine  replacement; nicotine  polacrilex (gum) ordered. Smoking cessation encouraged  -- Smoking cessation encouraged  4. Discharge Planning:   -- Social work and case management to assist with discharge planning and identification of hospital follow-up needs prior to discharge  -- Estimated  discharge: 11/10  -- Discharge Concerns: Need to establish a safety plan; Medication compliance and effectiveness  -- Discharge Goals: Return home with outpatient referrals for mental health follow-up including medication management/psychotherapy   Lynwood Morene Lavone Delsie, MD 08/31/2024, 3:48 PM

## 2024-08-31 NOTE — Group Note (Signed)
 Date:  08/31/2024 Time:  1:49 PM  Group Topic/Focus: Social Wellness Developing a Wellness Toolbox:   The focus of this group is to help patients develop a wellness toolbox with skills and strategies to promote recovery upon discharge.    Participation Level:  Did Not Attend    Additional Comments:  Patient didn't attend  Manuel [name] 08/31/2024, 1:49 PM

## 2024-08-31 NOTE — Plan of Care (Signed)
  Problem: Education: Goal: Knowledge of [facility] Education information/materials will improve 08/31/2024 2249 by Quillian Revonda LABOR, RN Outcome: Progressing 08/31/2024 2249 by Quillian Revonda LABOR, RN Outcome: Progressing Goal: Emotional status will improve 08/31/2024 2249 by Quillian Revonda LABOR, RN Outcome: Progressing 08/31/2024 2249 by Quillian Revonda LABOR, RN Outcome: Progressing Goal: Mental status will improve 08/31/2024 2249 by Quillian Revonda LABOR, RN Outcome: Progressing 08/31/2024 2249 by Quillian Revonda LABOR, RN Outcome: Progressing Goal: Verbalization of understanding the information provided will improve 08/31/2024 2249 by Quillian Revonda LABOR, RN Outcome: Progressing 08/31/2024 2249 by Quillian Revonda LABOR, RN Outcome: Progressing   Problem: Activity: Goal: Interest or engagement in activities will improve 08/31/2024 2249 by Quillian Revonda LABOR, RN Outcome: Progressing 08/31/2024 2249 by Quillian Revonda LABOR, RN Outcome: Progressing Goal: Sleeping patterns will improve 08/31/2024 2249 by Quillian Revonda LABOR, RN Outcome: Progressing 08/31/2024 2249 by Quillian Revonda A, RN Outcome: Progressing   Problem: Coping: Goal: Ability to verbalize frustrations and anger appropriately will improve 08/31/2024 2249 by Quillian Revonda LABOR, RN Outcome: Progressing 08/31/2024 2249 by Quillian Revonda A, RN Outcome: Progressing Goal: Ability to demonstrate self-control will improve 08/31/2024 2249 by Quillian Revonda LABOR, RN Outcome: Progressing 08/31/2024 2249 by Quillian Revonda LABOR, RN Outcome: Progressing   Problem: Health Behavior/Discharge Planning: Goal: Identification of resources available to assist in meeting health care needs will improve 08/31/2024 2249 by Quillian Revonda LABOR, RN Outcome: Progressing 08/31/2024 2249 by Quillian Revonda LABOR, RN Outcome: Progressing Goal: Compliance with treatment plan for underlying cause of condition will improve 08/31/2024 2249 by Quillian Revonda LABOR, RN Outcome: Progressing 08/31/2024 2249 by Quillian Revonda LABOR, RN Outcome: Progressing   Problem: Physical Regulation: Goal: Ability to maintain clinical measurements within normal limits will improve 08/31/2024 2249 by Quillian Revonda LABOR, RN Outcome: Progressing 08/31/2024 2249 by Quillian Revonda LABOR, RN Outcome: Progressing   Problem: Safety: Goal: Periods of time without injury will increase Outcome: Progressing   Problem: Education: Goal: Knowledge of [facility] Education information/materials will improve Outcome: Progressing Goal: Emotional status will improve Outcome: Progressing Goal: Mental status will improve Outcome: Progressing Goal: Verbalization of understanding the information provided will improve Outcome: Progressing   Problem: Education: Goal: Ability to state activities that reduce stress will improve Outcome: Progressing   Problem: Education: Goal: Ability to make informed decisions regarding treatment will improve Outcome: Progressing   Problem: Activity: Goal: Will identify at least one activity in which they can participate Outcome: Progressing

## 2024-08-31 NOTE — Group Note (Signed)
 Date:  08/31/2024 Time:  1:18 PM  Group Topic/Focus: Recreational Therapy Patient use their thinking skills to guess the lyrics of the song     Participation Level:  Did Not Attend    Additional Comments:  Did not attend group  Lashonna Rieke M Skarleth [place]ico 08/31/2024, 1:18 PM

## 2024-08-31 NOTE — Group Note (Signed)
 Date:  08/31/2024 Time:  3:52 PM  Group Topic/Focus:  Overcoming Stress:   The focus of this group is to define stress and help patients assess their triggers.    Participation Level:  Active  Participation Quality:  Appropriate  Affect:  Appropriate  Cognitive:  Appropriate  Insight: Appropriate  Engagement in Group:  Engaged  Modes of Intervention:  Discussion   Manuel [name]  [name] 08/31/2024, 3:52 PM

## 2024-08-31 NOTE — BH IP Treatment Plan (Signed)
 Interdisciplinary Treatment and Diagnostic Plan Update  08/31/2024 Time of Session: 10:25 AM  [name] MRN: [id number]  Principal Diagnosis: Obsessive compulsive disorder  Secondary Diagnoses: Principal Problem:   Obsessive compulsive disorder Active Problems:   Major depressive disorder, recurrent episode, moderate degree (HCC)   Current Medications:  Current Facility-Administered Medications  Medication Dose Route Frequency Provider Last Rate Last Admin   acetaminophen  (TYLENOL ) tablet 650 mg  650 mg Oral Q6H PRN Olasunkanmi, Oluwatosin, NP       alum & mag hydroxide-simeth (MAALOX/MYLANTA) 200-200-20 MG/5ML suspension 30 mL  30 mL Oral Q4H PRN Olasunkanmi, Oluwatosin, NP       clonazepam (KLONOPIN) disintegrating tablet 0.375 mg  0.375 mg Oral QHS Delsie Lynwood Morene Lavone, MD       cloNIDine (CATAPRES) tablet 0.1 mg  0.1 mg Oral BH-qamhs Olasunkanmi, Oluwatosin, NP       Followed by   NOREEN ON 09/02/2024] cloNIDine (CATAPRES) tablet 0.1 mg  0.1 mg Oral QAC breakfast Olasunkanmi, Oluwatosin, NP       dicyclomine (BENTYL) tablet 20 mg  20 mg Oral Q6H PRN Olasunkanmi, Oluwatosin, NP       haloperidol (HALDOL) tablet 5 mg  5 mg Oral TID PRN Olasunkanmi, Oluwatosin, NP       And   diphenhydrAMINE (BENADRYL) capsule 50 mg  50 mg Oral TID PRN Olasunkanmi, Oluwatosin, NP       diphenhydrAMINE (BENADRYL) capsule 50 mg  50 mg Oral Q8H PRN Olasunkanmi, Oluwatosin, NP       haloperidol lactate (HALDOL) injection 5 mg  5 mg Intramuscular TID PRN Olasunkanmi, Oluwatosin, NP       And   diphenhydrAMINE (BENADRYL) injection 50 mg  50 mg Intramuscular TID PRN Olasunkanmi, Oluwatosin, NP       And   LORazepam (ATIVAN) injection 2 mg  2 mg Intramuscular TID PRN Olasunkanmi, Oluwatosin, NP       haloperidol lactate (HALDOL) injection 10 mg  10 mg Intramuscular TID PRN Olasunkanmi, Oluwatosin, NP       And   diphenhydrAMINE (BENADRYL) injection 50 mg  50 mg Intramuscular TID PRN  Olasunkanmi, Oluwatosin, NP       And   LORazepam (ATIVAN) injection 2 mg  2 mg Intramuscular TID PRN Olasunkanmi, Oluwatosin, NP       fluvoxaMINE (LUVOX) tablet 25 mg  25 mg Oral BID Delsie Lynwood Morene Lavone, MD       hydrOXYzine (ATARAX) tablet 25 mg  25 mg Oral Q6H PRN Olasunkanmi, Oluwatosin, NP   25 mg at 08/30/24 2105   loperamide (IMODIUM) capsule 2-4 mg  2-4 mg Oral PRN Olasunkanmi, Oluwatosin, NP       magnesium hydroxide (MILK OF MAGNESIA) suspension 30 mL  30 mL Oral Daily PRN Olasunkanmi, Oluwatosin, NP       methocarbamol  (ROBAXIN ) tablet 500 mg  500 mg Oral Q8H PRN Olasunkanmi, Oluwatosin, NP       naproxen (NAPROSYN) tablet 500 mg  500 mg Oral BID PRN Olasunkanmi, Oluwatosin, NP       nicotine  (NICODERM CQ  - dosed in mg/24 hours) patch 21 mg  21 mg Transdermal Daily Bouchard, Marc A, DO   21 mg at 08/31/24 0754   nicotine  polacrilex (NICORETTE) gum 2 mg  2 mg Oral PRN Prentis Kitchens A, DO   2 mg at 08/31/24 0754   ondansetron  (ZOFRAN -ODT) disintegrating tablet 4 mg  4 mg Oral Q6H PRN Olasunkanmi, Oluwatosin, NP       saccharomyces boulardii (  FLORASTOR) capsule 250 mg  250 mg Oral BID Delsie Lynwood Morene Lavone, MD       traZODone  (DESYREL ) tablet 50 mg  50 mg Oral QHS PRN Olasunkanmi, Oluwatosin, NP   50 mg at 08/30/24 2105   [START ON 09/01/2024] Vitamin D (Ergocalciferol) (DRISDOL) 1.25 MG (50000 UNIT) capsule 50,000 Units  50,000 Units Oral Q7 days Delsie Lynwood Morene Lavone, MD       PTA Medications: Medications Prior to Admission  Medication Sig Dispense Refill Last Dose/Taking   Acetylcysteine (NAC PO) Take 1 tablet by mouth daily.      Buprenorphine HCl-Naloxone HCl 8-2 MG FILM Place 1 Film under the tongue in the morning and at bedtime. (Patient not taking: Reported on 08/28/2024)      clonazePAM (KLONOPIN) 0.5 MG tablet Take 0.5 mg by mouth daily.      cloNIDine (CATAPRES) 0.1 MG tablet Take 0.1 mg by mouth every 8 (eight) hours as needed.       EPINEPHrine (ADRENALIN) 1 MG/ML SOLN injection Inject 0.3 mg into the muscle See admin instructions. Re-initiate treatment only upon physician approval (Patient not taking: Reported on 08/15/2024)      fluvoxaMINE (LUVOX) 25 MG tablet Take 25 mg by mouth daily. (Patient not taking: Reported on 08/28/2024)      gabapentin (NEURONTIN) 300 MG capsule Take 300 mg by mouth daily.      inFLIXimab-dyyb (INFLECTRA IV) Inject 370 mg into the vein See admin instructions. Inject into venous catheter.      Multiple Vitamins-Minerals (MULTIVITAMIN MEN PO) Take 1 Dose by mouth daily. Take 1 gummy by mouth daily.      OLANZapine (ZYPREXA) 10 MG tablet Take 10 mg by mouth at bedtime. (Patient not taking: Reported on 08/28/2024)      OLANZapine (ZYPREXA) 5 MG tablet Take 5 mg by mouth daily. (Patient not taking: Reported on 08/28/2024)      ONDANSETRON  PO Take 1 tablet by mouth See admin instructions. Frequency and dose unspecified (Patient not taking: Reported on 08/28/2024)      PREDNISONE  PO Take 1 Dose by mouth See admin instructions. Frequency unspecified (Patient not taking: Reported on 08/28/2024)      Risankizumab-rzaa (SKYRIZI PEN [place]) Inject 1 Dose into the skin See admin instructions. Frequency unknown (Patient not taking: Reported on 08/28/2024)      SPRAVATO, 84 MG DOSE, 28 MG/DEVICE SOPK Place 3 sprays into both nostrils See admin instructions. Frequency unspecified (Patient not taking: Reported on 08/28/2024)       Patient Stressors: Educational concerns   Financial difficulties   Health problems   Occupational concerns   Substance abuse    Patient Strengths: Ability for insight  Average or above average intelligence  Capable of independent living  Communication skills  General fund of knowledge  Motivation for treatment/growth  Physical Health  Supportive family/friends   Treatment Modalities: Medication Management, Group therapy, Case management,  1 to 1 session with clinician, Psychoeducation,  Recreational therapy.   Physician Treatment Plan for Primary Diagnosis: Obsessive compulsive disorder Long Term Goal(s): Improvement in symptoms so as ready for discharge   Short Term Goals: Ability to identify changes in lifestyle to reduce recurrence of condition will improve Ability to verbalize feelings will improve Ability to demonstrate self-control will improve Ability to identify and develop effective coping behaviors will improve  Medication Management: Evaluate patient's response, side effects, and tolerance of medication regimen.  Therapeutic Interventions: 1 to 1 sessions, Unit Group sessions and Medication administration.  Evaluation  of Outcomes: Not Progressing  Physician Treatment Plan for Secondary Diagnosis: Principal Problem:   Obsessive compulsive disorder Active Problems:   Major depressive disorder, recurrent episode, moderate degree (HCC)  Long Term Goal(s): Improvement in symptoms so as ready for discharge   Short Term Goals: Ability to identify changes in lifestyle to reduce recurrence of condition will improve Ability to verbalize feelings will improve Ability to demonstrate self-control will improve Ability to identify and develop effective coping behaviors will improve     Medication Management: Evaluate patient's response, side effects, and tolerance of medication regimen.  Therapeutic Interventions: 1 to 1 sessions, Unit Group sessions and Medication administration.  Evaluation of Outcomes: Not Progressing   RN Treatment Plan for Primary Diagnosis: Obsessive compulsive disorder Long Term Goal(s): Knowledge of disease and therapeutic regimen to maintain health will improve  Short Term Goals: Ability to remain free from injury will improve, Ability to verbalize frustration and anger appropriately will improve, Ability to demonstrate self-control, Ability to participate in decision making will improve, Ability to verbalize feelings will improve,  Ability to disclose and discuss suicidal ideas, Ability to identify and develop effective coping behaviors will improve, and Compliance with prescribed medications will improve  Medication Management: RN will administer medications as ordered by provider, will assess and evaluate patient's response and provide education to patient for prescribed medication. RN will report any adverse and/or side effects to prescribing provider.  Therapeutic Interventions: 1 on 1 counseling sessions, Psychoeducation, Medication administration, Evaluate responses to treatment, Monitor vital signs and CBGs as ordered, Perform/monitor CIWA, COWS, AIMS and Fall Risk screenings as ordered, Perform wound care treatments as ordered.  Evaluation of Outcomes: Not Progressing   LCSW Treatment Plan for Primary Diagnosis: Obsessive compulsive disorder Long Term Goal(s): Safe transition to appropriate next level of care at discharge, Engage patient in therapeutic group addressing interpersonal concerns.  Short Term Goals: Engage patient in aftercare planning with referrals and resources, Increase social support, Increase ability to appropriately verbalize feelings, Increase emotional regulation, Facilitate acceptance of mental health diagnosis and concerns, Facilitate patient progression through stages of change regarding substance use diagnoses and concerns, Identify triggers associated with mental health/substance abuse issues, and Increase skills for wellness and recovery  Therapeutic Interventions: Assess for all discharge needs, 1 to 1 time with Social worker, Explore available resources and support systems, Assess for adequacy in community support network, Educate family and significant other(s) on suicide prevention, Complete Psychosocial Assessment, Interpersonal group therapy.  Evaluation of Outcomes: Not Progressing   Progress in Treatment: Attending groups: attended some groups Participating in groups: Yes. Taking  medication as prescribed: Yes. Toleration medication: Yes. Family/Significant other contact made: Yes, individual(s) contacted:  Mother, [name] [phone] Patient understands diagnosis: Yes. Discussing patient identified problems/goals with staff: Yes. Medical problems stabilized or resolved: Yes. Denies suicidal/homicidal ideation: Yes. Issues/concerns per patient self-inventory: No.  New problem(s) identified:  No  New Short Term/Long Term Goal(s):  detox, medication management for mood stabilization; elimination of SI thoughts; development of comprehensive mental wellness/sobriety plan    Patient Goals:  I want to break the OCD cycle.  Discharge Plan or Barriers:  Patient recently admitted. CSW will continue to follow and assess for appropriate referrals and possible discharge planning.    Reason for Continuation of Hospitalization: Depression Medication stabilization Detox  Estimated Length of Stay:  5 - 7 days  Last 3 [place] Suicide Severity Risk Score: Flowsheet Row Ad[facility] (Current) [facility] from 08/27/2024 in  [facility] ED from [facility]/14/2024 in [facility]  C-SSRS RISK CATEGORY Error: Q3, 4, or 5 should not be populated when Q2 is No Low Risk Low Risk    Last PHQ 2/9 Scores:    09/16/2020    9:46 AM  Depression screen PHQ 2/9  Decreased Interest 0  Down, Depressed, Hopeless 0  PHQ - 2 Score 0    Scribe for Treatment Team: Kikue Gerhart O Glendal Cassaday, LCSWA 08/31/2024 4:11 PM

## 2024-08-31 NOTE — Progress Notes (Signed)
 D: Patient is alert, oriented, pleasant, and cooperative. Denies SI, HI, AVH, and verbally contracts for safety. Patient reports he slept poor last night with sleeping medication. Patient reports his appetite as good, energy level as normal, and concentration as good. Patient rates his depression 2/10, hopelessness 1/10, and anxiety 1/10. Patient reports tinnitus. Patient blood pressure and heart rate are low.    A: Clonidine held due to low blood pressure and heart rate. Patient refused chron's medication. Other scheduled medications administered per MD order. PRN nicotine  gum administered. Support provided. Patient educated on safety on the unit and medications. Routine safety checks every 15 minutes. Patient stated understanding to tell nurse about any new physical symptoms. Patient understands to tell staff of any needs.     R: No adverse drug reactions noted. Patient remains safe at this time and will continue to monitor.    08/31/24 1100  Psych Admission Type (Psych Patients Only)  Admission Status Voluntary  Psychosocial Assessment  Patient Complaints Sleep disturbance  Eye Contact Fair  Facial Expression Animated  Affect Appropriate to circumstance  Speech Logical/coherent  Interaction Assertive  Motor Activity Fidgety  Appearance/Hygiene Unremarkable  Behavior Characteristics Cooperative;Appropriate to situation;Calm  Mood Pleasant  Thought Process  Coherency WDL  Content WDL  Delusions None reported or observed  Perception WDL  Hallucination None reported or observed  Judgment Poor  Confusion None  Danger to Self  Current suicidal ideation? Denies  Self-Injurious Behavior No self-injurious ideation or behavior indicators observed or expressed   Agreement Not to Harm Self Yes  Description of Agreement verbal  Danger to Others  Danger to Others None reported or observed

## 2024-08-31 NOTE — Group Note (Signed)
 Date:  08/31/2024 Time:  10:56 AM  Group Topic/Focus: Self-Regulation assessment and goals orientation  Goals Group:   The focus of this group is to help patients establish daily goals to achieve during treatment and discuss how the patient can incorporate goal setting into their daily lives to aide in recovery. Orientation:   The focus of this group is to educate the patient on the purpose and policies of crisis stabilization and provide a format to answer questions about their admission.  The group details unit policies and expectations of patients while admitted.    Participation Level:  Active  Participation Quality:  Appropriate  Affect:  Appropriate  Cognitive:  Alert  Insight: Appropriate  Engagement in Group:  Engaged  Modes of Intervention:  Orientation  Additional Comments:  Patient did attend and was engaged with activity.  Jhoan Schmieder M Zenobia Kuennen 08/31/2024, 10:56 AM

## 2024-08-31 NOTE — Plan of Care (Signed)

## 2024-08-31 NOTE — Group Note (Signed)
 Recreation Therapy Group Note   Group Topic:Leisure Education  Group Date: 08/31/2024 Start Time: 0940 End Time: 1012 Facilitators: Ryna Beckstrom-McCall, LRT,CTRS Location: [address] Dayroom   Group Topic: Leisure Education  Goal Area(s) Addresses:  Patient will identify positive leisure activities.  Patient will identify one positive benefit of participation in leisure activities.   Behavioral Response: Moderate  Intervention: Leisure Group Game  Activity: Patient, MHT, and LRT participated in playing a trivia game of Guess the Verdi. Teams took turns spinning the wheel. Whatever category ([address], Pop, Dance, Hip Hop, Indie and R&B) the wheel landed on, LRT read the lyric and that team would guess the missing lyric. The team with the most points wins the game.  Education:  Leisure Exposure, Pharmacist, Community, Discharge Planning  Education Outcome: Acknowledges education/In group clarification offered/Needs additional education   Affect/Mood: Appropriate   Participation Level: Moderate   Participation Quality: Independent   Behavior: Attentive    Speech/Thought Process: Focused   Insight: Moderate   Judgement: Moderate   Modes of Intervention: Competitive Play   Patient Response to Interventions:  Attentive   Education Outcome:  In group clarification offered    Clinical Observations/Individualized Feedback: Pt was attentive but engaged at times during activity.      Plan: Continue to engage patient in RT group sessions 2-3x/week.   Yanin Muhlestein-McCall, LRT,CTRS 08/31/2024 12:08 PM

## 2024-08-31 NOTE — Progress Notes (Signed)
 [facility] MD Progress Note  08/31/2024 4:15 PM MARTHA SOLTYS  MRN:  [id number] Principal Problem: Obsessive compulsive disorder Diagnosis: Principal Problem:   Obsessive compulsive disorder Active Problems:   Major depressive disorder, recurrent episode, moderate degree (HCC)   Subjective:   CC: I took way too much Kratom and I have had terrible depression   History of Present Illness: Manuel [name] is a 23 yo male with past psychiatric history significant for major depressive disorder and medical history significant for Crohn's disease s/p multiple bowel obstructions resulting in bowel resection, family history of aortic dissection, long term use of opioids, and most recently, sensorineural hearing loss and severe worsening of his 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  Overnight events: Slept 7.5 hours. Denied SI, HI, AVH. No concerns or refusals. Hydroxyzine 25 mg x1.   Per Patient: Patient mood is bored but is feeling much better than yesterday overall. More calm. Feels as if he has a lot more energy as he is sleeping better. (Notes 4 hours of sleep, nursing reported 7.5 hours). Patient had further concerns with how his OCD had been managed up to this hospitalization but feels it is appropriately managed here: (I really appreciate Dr. Delsie). Amenable to increasing fluvoxamine tonight. Believes that it is unlikely to worsen tinnitus as this typically happens with the first dose of SSRI. Denied SI, HI, AVH. Would like to continue extremely slow clonazepam taper. No new changes to his medications. Patient said that he already received workup for Marfan's syndrome previously but does not believe he was diagnosed with hit. Patient is asymptomatic in terms of bodily issues. More chatty today, discussed Game of Thrones and Counter-Strike.   Sleep: much better, reports only sleeping 4 hours (two stretches of two hours) Appetite:  Fair Depression: Improving Anxiety: Improving Auditory  Hallucinations: denies, not seen responding Visual Hallucinations: denies, not seen responding HI: denies SI: denies Side effects from medications: does not endorse any side-effects they attribute to medications. Other concerns discussed with patient:   Review of Systems  Constitutional:  Negative for chills, diaphoresis, fever, malaise/fatigue and weight loss.  Respiratory:  Negative for cough.   Cardiovascular:  Negative for chest pain.  Gastrointestinal:  Negative for abdominal pain, constipation, diarrhea, nausea and vomiting.  Genitourinary:  Negative for urgency.  Psychiatric/Behavioral:  Positive for depression and substance abuse. Negative for hallucinations and suicidal ideas. The patient is nervous/anxious and has insomnia.     Total time spent with patient: 45 minutes  HX from H&P:  Previous Psych Diagnoses: Suicidal ideations, anxiety, MDD, Asperger Syndrome, OCD, anxiety and depression.  Prior inpatient treatment: [facility] trips x2 Current/prior outpatient treatment: sees Dr Ismael Franco Prior rehab hx: denies Psychotherapy hx: denies, has appt 11/12 History of suicide: denies, collateral denies History of homicide or aggression: denies, collateral denies Psychiatric medication history: lexapro, trazodone , ketamine, olanzapine, methocarbamol  Psychiatric medication compliance history: poor per mother (hx from [facility]) Neuromodulation history: denies  Current Psychiatrist: Dr Charlanne Current therapist: starting with tinnitus therapist 11/12   Substance Abuse Hx: Alcohol: tried, but has never used consistently. Does not enjoy Tobacco: vapes nicotine  hourly Illicit drugs:  Marijuana/THC: 3x a week Cocaine: denies Methamphetamines: denies Benzodiazepines: denies Opioids: patient gives inconsistent history on this.  Kratom: yes, patient reports escalating recent history of kratom use Hallucinogens: denies Bath salts: denies Prescription Drug abuse problems:    Past  Medical History: Medical Diagnoses: Crohns Disease on long term biologic therapy, status post gut resection in 12/2023, marfanoid hyperflexibility, sensorineural hearing loss  of right ear. Family history of aortic dissection. Some old notes imply Marfan's syndrome? Home Rx: see below Prior Hosp: multiple for gut-related problems Prior Surgeries/Trauma: small bowel resection 12/2023 Head trauma, LOC, concussions, seizures: denies Allergies: denies   Family History: Medical: Family history of aortic dissection Psych: patient unaware.  Psych Rx: none known SA/HA: denies Substance use family hx: denies   Social History: Childhood (bring, raised, lives now, parents, siblings, schooling, education): Abuse: denies Marital Status: never married Sexual orientation: Children: denies Education: medical laboratory scientific officer from [employer] Employment: recently quit position as physiological scientist Peer Group: has several close friends that he stays in touch with from high school and university Housing: Patient currently lives with mother. Firearms: No access to guns.  Finances: mild strain, unemployed Legal: no pending issues. No historic Museum/gallery Exhibitions Officer: denies affiliation     Past Medical History (auto-populated):  Past Medical History:  Diagnosis Date   Allergy    Anxiety    Constipation since about age 69   Depression    Eczema     Past Surgical History:  Procedure Laterality Date   BOWEL RESECTION  07/2015   COLONOSCOPY N/A 12/29/2012   Procedure: COLONOSCOPY;  Surgeon: Fairy VEAR Gaskins, MD;  Location: [facility] OR;  Service: Gastroenterology;  Laterality: N/A;   COLONOSCOPY N/A 12/29/2012   Procedure: COLONOSCOPY;  Surgeon: Fairy VEAR Gaskins, MD;  Location: [facility] OR;  Service: Gastroenterology;  Laterality: N/A;   ESOPHAGOGASTRODUODENOSCOPY N/A 12/29/2012   Procedure: ESOPHAGOGASTRODUODENOSCOPY (EGD);  Surgeon: Fairy VEAR Gaskins, MD;  Location: [facility] OR;  Service: Gastroenterology;  Laterality: N/A;    ESOPHAGOGASTRODUODENOSCOPY Bilateral 12/29/2012   Procedure: ESOPHAGOGASTRODUODENOSCOPY (EGD);  Surgeon: Fairy VEAR Gaskins, MD;  Location: [facility] OR;  Service: Gastroenterology;  Laterality: Bilateral;    Social History:  Social History   Substance and Sexual Activity  Alcohol Use Never   Alcohol/week: 8.0 standard drinks of alcohol   Types: 8 Cans of beer per week   Comment: pt stated during admission that he does not use alcohol     Social History   Substance and Sexual Activity  Drug Use Yes   Types: Marijuana, Other-see comments   Comment: kratum for 8 days, never used kratum before, anxious about tinnitis    Social History   Socioeconomic History   Marital status: Single    Spouse name: Not on file   Number of children: Not on file   Years of education: 16   Highest education level: Bachelor's degree (e.g., BA, AB, BS)  Occupational History   Occupation: Student  Tobacco Use   Smoking status: Every Day    Current packs/day: 1.00    Types: Cigarettes   Smokeless tobacco: Former    Quit date: 08/2024   Tobacco comments:    Vapes tobacco every day      uses THC since 23 yrs old, occasionally twice week  Vaping Use   Vaping status: Every Day   Last attempt to quit: 08/29/2024   Devices: vapes tobacco every day  Substance and Sexual Activity   Alcohol use: Never    Alcohol/week: 8.0 standard drinks of alcohol    Types: 8 Cans of beer per week    Comment: pt stated during admission that he does not use alcohol   Drug use: Yes    Types: Marijuana, Other-see comments    Comment: kratum for 8 days, never used kratum before, anxious about tinnitis   Sexual activity: Never  Other Topics Concern   Not on file  Social History Narrative   Consulting Civil Engineer at [employer]   Financial Resource Strain: Not on file  Food Insecurity: No Food Insecurity (08/29/2024)   Hunger Vital Sign    Worried About Running Out of Food in the Last Year: Never true    Ran Out of  Food in the Last Year: Never true  Transportation Needs: No Transportation Needs (08/29/2024)   PRAPARE - Administrator, Civil Service (Medical): No    Lack of Transportation (Non-Medical): No  Physical Activity: Not on file  Stress: Not on file  Social Connections: Not on file   Additional Social History:   Objective: Medications, Labs, Mental Status Exam, Physical Exam Current Medications: Current Facility-Administered Medications  Medication Dose Route Frequency Provider Last Rate Last Admin   acetaminophen  (TYLENOL ) tablet 650 mg  650 mg Oral Q6H PRN Olasunkanmi, Oluwatosin, NP       alum & mag hydroxide-simeth (MAALOX/MYLANTA) 200-200-20 MG/5ML suspension 30 mL  30 mL Oral Q4H PRN Olasunkanmi, Oluwatosin, NP       clonazepam (KLONOPIN) disintegrating tablet 0.375 mg  0.375 mg Oral QHS Delsie Lynwood Morene Lavone, MD       cloNIDine (CATAPRES) tablet 0.1 mg  0.1 mg Oral BH-qamhs Olasunkanmi, Oluwatosin, NP       Followed by   NOREEN ON 09/02/2024] cloNIDine (CATAPRES) tablet 0.1 mg  0.1 mg Oral QAC breakfast Olasunkanmi, Oluwatosin, NP       dicyclomine (BENTYL) tablet 20 mg  20 mg Oral Q6H PRN Olasunkanmi, Oluwatosin, NP       haloperidol (HALDOL) tablet 5 mg  5 mg Oral TID PRN Olasunkanmi, Oluwatosin, NP       And   diphenhydrAMINE (BENADRYL) capsule 50 mg  50 mg Oral TID PRN Olasunkanmi, Oluwatosin, NP       diphenhydrAMINE (BENADRYL) capsule 50 mg  50 mg Oral Q8H PRN Olasunkanmi, Oluwatosin, NP       haloperidol lactate (HALDOL) injection 5 mg  5 mg Intramuscular TID PRN Olasunkanmi, Oluwatosin, NP       And   diphenhydrAMINE (BENADRYL) injection 50 mg  50 mg Intramuscular TID PRN Olasunkanmi, Oluwatosin, NP       And   LORazepam (ATIVAN) injection 2 mg  2 mg Intramuscular TID PRN Olasunkanmi, Oluwatosin, NP       haloperidol lactate (HALDOL) injection 10 mg  10 mg Intramuscular TID PRN Olasunkanmi, Oluwatosin, NP       And   diphenhydrAMINE (BENADRYL) injection  50 mg  50 mg Intramuscular TID PRN Olasunkanmi, Oluwatosin, NP       And   LORazepam (ATIVAN) injection 2 mg  2 mg Intramuscular TID PRN Olasunkanmi, Oluwatosin, NP       fluvoxaMINE (LUVOX) tablet 25 mg  25 mg Oral BID Delsie Lynwood Morene Lavone, MD   25 mg at 08/31/24 1612   hydrOXYzine (ATARAX) tablet 25 mg  25 mg Oral Q6H PRN Olasunkanmi, Oluwatosin, NP   25 mg at 08/31/24 1613   loperamide (IMODIUM) capsule 2-4 mg  2-4 mg Oral PRN Olasunkanmi, Oluwatosin, NP       magnesium hydroxide (MILK OF MAGNESIA) suspension 30 mL  30 mL Oral Daily PRN Olasunkanmi, Oluwatosin, NP       methocarbamol  (ROBAXIN ) tablet 500 mg  500 mg Oral Q8H PRN Olasunkanmi, Oluwatosin, NP       naproxen (NAPROSYN) tablet 500 mg  500 mg Oral BID PRN Olasunkanmi, Oluwatosin, NP  nicotine  (NICODERM CQ  - dosed in mg/24 hours) patch 21 mg  21 mg Transdermal Daily Prentis Kitchens A, DO   21 mg at 08/31/24 0754   nicotine  polacrilex (NICORETTE) gum 2 mg  2 mg Oral PRN Prentis Kitchens A, DO   2 mg at 08/31/24 1613   ondansetron  (ZOFRAN -ODT) disintegrating tablet 4 mg  4 mg Oral Q6H PRN Olasunkanmi, Oluwatosin, NP       saccharomyces boulardii (FLORASTOR) capsule 250 mg  250 mg Oral BID Delsie Lynwood Morene Lavone, MD       traZODone  (DESYREL ) tablet 50 mg  50 mg Oral QHS PRN Olasunkanmi, Oluwatosin, NP   50 mg at 08/30/24 2105   [START ON 09/01/2024] Vitamin D (Ergocalciferol) (DRISDOL) 1.25 MG (50000 UNIT) capsule 50,000 Units  50,000 Units Oral Q7 days Delsie Lynwood Morene Lavone, MD        Lab Results:  Results for orders placed or performed during the hospital encounter of 08/29/24 (from the past 48 hours)  Vitamin B12     Status: None   Collection Time: 08/31/24  6:12 AM  Result Value Ref Range   Vitamin B-12 411 180 - 914 pg/mL    Comment: Performed at [facility], [address], [place], [place] [address]  VITAMIN D 25 Hydroxy (Vit-D Deficiency, Fractures)     Status: Abnormal    Collection Time: 08/31/24  6:12 AM  Result Value Ref Range   Vit D, 25-Hydroxy 16.27 (L) 30 - 100 ng/mL    Comment: (NOTE) Vitamin D deficiency has been defined by the Institute of Medicine  and an Endocrine Society practice guideline as a level of serum 25-OH  vitamin D less than 20 ng/mL (1,2). The Endocrine Society went on to  further define vitamin D insufficiency as a level between 21 and 29  ng/mL (2).  1. IOM (Institute of Medicine). 2010. Dietary reference intakes for  calcium and D. [place]: [employer]. 2. Holick MF, Binkley [place], Bischoff-Ferrari HA, et al. Evaluation,  treatment, and prevention of vitamin D deficiency: an Endocrine  Society clinical practice guideline, JCEM. 2011 Jul; 96(7): 1911-30.  Performed at [facility], [address] [place], [place] [address]     Blood Alcohol level:  Lab Results  Component Value Date   [facility] <15 08/27/2024    Metabolic Disorder Labs: Lab Results  Component Value Date   HGBA1C 4.7 (L) 08/14/2024   MPG 88.19 08/14/2024   No results found for: PROLACTIN Lab Results  Component Value Date   CHOL 136 08/14/2024   TRIG 115 08/14/2024   HDL 57 08/14/2024   CHOLHDL 2.4 08/14/2024   VLDL 23 08/14/2024   LDLCALC 56 08/14/2024   LDLCALC 59 12/29/2012    Physical Findings: AIMS:  , ,  ,  ,    CIWA:  CIWA-Ar Total: 4 COWS:  COWS Total Score: 1  Musculoskeletal: Strength & Muscle Tone: within normal limits Gait & Station: normal Patient leans: N/A   Psychiatric Specialty Exam: Mental Status Exam: General Appearance and Behavior: Tall, thin, pale caucasian young adult male.  Patient does not appear to have shaved or showered in the last 24 hours.  He appears extremely anxious  Orientation:  Full (Time, Place, and Person)  Memory:  Grossly intact  Attention: Good  Eye Contact:  Good  Speech:  Clear and Coherent and rapid but not pressured  Language:  Good  Volume:  Normal  Mood: I  feel very  anxious about a lot of different things  Affect:  Appropriate and Full Range  Thought Process: More linear than yesterday, though patient describes himself as less than organized than yesterday  Thought Content:  Obsessions (tinnitus), Rumination (every possible thing that he has done that could have contributed to his tinnitus), and Tangential (all things go back to tinnitus)  Suicidal Thoughts:  No  Homicidal Thoughts:  No  Judgement:  Fair  Insight: Shallow  Psychomotor Activity:  Normal  Akathisia:  No  Fund of Knowledge:  Good Assets:  Communication Skills Desire for Improvement Financial Resources/Insurance Housing Intimacy Leisure Time Physical Health Resilience Social Support  Cognition:  WNL  ADL's:  Intact    Details about paranoia, delusions, or hallucinations: Physical Exam: Physical Exam Vitals and nursing note reviewed.  Constitutional:      General: He is not in acute distress.    Appearance: Normal appearance. He is not ill-appearing.  HENT:     Head: Normocephalic and atraumatic.  Pulmonary:     Effort: Pulmonary effort is normal.  Skin:    General: Skin is warm and dry.  Neurological:     General: No focal deficit present.     Mental Status: He is alert and oriented to person, place, and time.     Sensory: No sensory deficit.  Psychiatric:        Behavior: Behavior normal.     Blood pressure (!) 104/59, pulse (!) 51, temperature 98 F (36.7 C), temperature source Oral, resp. rate 18, height 6' 2 (1.88 m), weight 73.5 kg, SpO2 100%. Body mass index is 20.8 kg/m.  ASSESSMENT: Admir Candelas is a 23 yo male with past psychiatric history significant for major depressive disorder and medical history significant for Crohn's disease s/p multiple bowel obstructions resulting in bowel resection, family history of aortic dissection, long term use of opioids, and most recently, sensorineural hearing loss and severe worsening of his chronic  tinnitus of his right ear.   Mr Ganus presentation meets full diagnostic criteria for multiple issues, including major depressive disorder (which he may have had for years), obsessive compulsive disorder (which appears to have started in March 2025 after gut resection), and adjustment disorder with panic attacks (since approximately September after the acute worsening of his sensorineural hearing loss).He also meets criteria for cannabis use disorder, nicotine  use disorder, and while there is no formal classification, kratom use disorder.   Patient has had a debilitating mental health deterioration leading to loss of job, loss of independence, and worsening obsessions and rumination on his loss.   11/7: Patient reports he is doing better overall.  There are some signs that this is the case indicating improved concentration during discussions, less tangents, ability to be redirected onto the topic.  Patient needs to constantly have this hospitalization reframed as treating for obsessive-compulsive disorder, not the tinnitus.  Patient requires significant reassurance, and is extremely knowledgeable and intelligent.  Needs and wants hard facts and data but has a tendency to ruminate.  Critical to be clear and direct before you present a plan to him.  11/8: Patient appears to continue to improve, with better sleep and energy. I think his overall subjective distress is minimal. Re-enforced again that we were treating his OCD. Will increase Fluvoxamine from 25 BID to 25 qAM + 50 qPM. Denied suicidal and homicidal ideations. Likely appropriate for DC Monday. Bradycardia appears to be baseline, on my exam was 50 BPM. No somatic symptomatology. No side effects.   Diagnoses /  Active Problems: Obsessive-compulsive disorder Major depressive disorder without psychotic features Generalized anxiety disorder Cannabis use disorder Other unspecified substance use disorder (kratom)  PLAN: Safety and  Monitoring:  --  Voluntary admission to inpatient psychiatric unit for safety, stabilization and treatment  -- Daily contact with patient to assess and evaluate symptoms and progress in treatment  -- Patient's case to be discussed in multi-disciplinary team meeting  -- Observation Level : q15 minute checks  -- Vital signs:  q12 hours  -- Precautions: suicide, elopement, and assault  2. Psychiatric Diagnoses and Treatment:  Increase fluvoxamine 25 mg twice daily -->  25 mg in the morning 50 mg at night. Continue clonazepam from 0.375 mg at bedtime for nighttime panic attacks Continue methocarbamol  500 mg Q8PRN for muscle spasms Continue saccaromyces boulardii (florastor probiotic) 250 mg BID for gut biota improvement (depression) Continue hydroxyzine 25 mg Q6 PRN for anxiety    The risks/benefits/side-effects/alternatives to this medication were discussed in detail with the patient and time was given for questions. The patient consents to medication trial.  Metabolic profile and EKG monitoring obtained while on an atypical antipsychotic Body mass index is 20.8 kg/m. Lipid Panel: Within normal limits on 08/14/24 Hbg A1c: 4.7 (Within normal limits) on 08/14/24 QTc: 373 ms, last obtained 08/29/2024 Encouraged patient to participate in unit milieu and in scheduled group therapies   -- Short Term Goals: Ability to identify changes in lifestyle to reduce recurrence of condition will improve, Ability to verbalize feelings will improve, Ability to demonstrate self-control will improve, and Ability to identify and develop effective coping behaviors will improve  -- Long Term Goals: Improvement in symptoms so as ready for discharge    3. Medical Issues Being Addressed:  Crohn's disease Immunosuppression (long-term use of Biologics) Sensorineural hearing loss of Right ear Tinnitus, chronic Labs reviewed, notable for low vitamin D, otherwise unremarkable  Tobacco Use Disorder  --  Patient in  need of nicotine  replacement; nicotine  polacrilex (gum) ordered. Smoking cessation encouraged  -- Smoking cessation encouraged  Hypovitaminosis D - Continue ergocalciferol 50,000U qWeek.   4. Discharge Planning:   -- Social work and case management to assist with discharge planning and identification of hospital follow-up needs prior to discharge  -- Estimated discharge: 11/10  -- Discharge Concerns: Need to establish a safety plan; Medication compliance and effectiveness  -- Discharge Goals: Return home with outpatient referrals for mental health follow-up including medication management/psychotherapy   Alyshia Kernan, MD 08/31/2024, 4:15 PM

## 2024-08-31 NOTE — Progress Notes (Signed)
(  Sleep Hours) - 8.25 (Any PRNs that were needed, meds refused, or side effects to meds)- Vistaril, Trazodone  (Any disturbances and when (visitation, over night)- n/a (Concerns raised by the patient)- none (SI/HI/AVH)- Denies

## 2024-08-31 NOTE — [facility] Suicide Risk Assessment (Signed)
 [facility] INPATIENT:  Family/Significant Other Suicide Prevention Education  Suicide Prevention Education:  Education Completed; Mother, [name] [phone],   has been identified by the patient as the family member/significant other with whom the patient will be residing, and identified as the person(s) who will aid the patient in the event of a mental health crisis (suicidal ideations/suicide attempt).  With written consent from the patient, the family member/significant other has been provided the following suicide prevention education, prior to the and/or following the discharge of the patient.  The suicide prevention education provided includes the following: Suicide risk factors Suicide prevention and interventions National Suicide Hotline telephone number [facility] assessment telephone number [facility] Emergency Assistance 911 [facility] and/or Residential Mobile Crisis Unit telephone number  Request made of family/significant other to: Remove weapons (e.g., guns, rifles, knives), all items previously/currently identified as safety concern.   Remove drugs/medications (over-the-counter, prescriptions, illicit drugs), all items previously/currently identified as a safety concern.  The family member/significant other verbalizes understanding of the suicide prevention education information provided.  The family member/significant other agrees to remove the items of safety concern listed above.  The patient mother stated that the past has not had any previous history of SI and that it has only been within the last 2 months. Mom requested to speak with Dr.  Roselyn GORMAN Lento 08/31/2024, 12:20 PM

## 2024-09-01 MED ORDER — FLUVOXAMINE MALEATE 50 MG PO TABS
25.0000 mg | ORAL_TABLET | Freq: Every morning | ORAL | Status: DC
  Administered 2024-09-02: 25 mg via ORAL
  Filled 2024-09-01: qty 1

## 2024-09-01 MED ORDER — FLUVOXAMINE MALEATE 50 MG PO TABS
50.0000 mg | ORAL_TABLET | Freq: Every day | ORAL | Status: DC
  Administered 2024-09-01: 50 mg via ORAL
  Filled 2024-09-01: qty 1

## 2024-09-01 NOTE — Group Note (Signed)
 Date:  09/01/2024 Time:  3:41 PM  Group Topic/Focus: Self confidence and peer support  This group participated in a music therapy session utilizing karaoke to promote self-expression, mood elevation, and social connection. The session provided a supportive and safe environment for patients to engage creatively through singing and listening to music. The activity encouraged self-confidence, peer support, and positive social interaction.    Participation Level:  Active  Participation Quality:  Appropriate, Attentive, Sharing, and Supportive  Affect:  Appropriate  Cognitive:  Alert and Appropriate  Insight: Good and Improving  Engagement in Group:  Engaged, Improving, and Supportive  Modes of Intervention:  Activity, Socialization, and Support  Additional Comments:  Ura attended and actively participated in [place].  Kristi HERO Dakarai Mcglocklin 09/01/2024, 3:41 PM

## 2024-09-01 NOTE — [facility] Group Notes (Signed)
 Ozro attended the social work group today 09/01/24 from 1000-1100.

## 2024-09-01 NOTE — Progress Notes (Signed)
 D: Patient is alert, oriented, pleasant, and cooperative. Denies SI, HI, AVH, and verbally contracts for safety. Patient reports he slept poor last night with sleeping medication. Patient reports his appetite as good, energy level as normal, and concentration as good. Patient rates his depression 1/10, hopelessness 2/10, and anxiety 1/10. Patient denies physical symptoms/pain.    A: Scheduled medications administered per MD order. Support provided. Patient educated on safety on the unit and medications. Routine safety checks every 15 minutes. Patient stated understanding to tell nurse about any new physical symptoms. Patient understands to tell staff of any needs.     R: No adverse drug reactions noted. Patient remains safe at this time and will continue to monitor.    09/01/24 0900  Psych Admission Type (Psych Patients Only)  Admission Status Voluntary  Psychosocial Assessment  Patient Complaints Anxiety  Eye Contact Fair  Facial Expression Animated  Affect Appropriate to circumstance  Speech Logical/coherent  Interaction Assertive  Motor Activity Fidgety  Appearance/Hygiene Unremarkable  Behavior Characteristics Cooperative;Appropriate to situation  Mood Pleasant  Thought Process  Coherency WDL  Content WDL  Delusions None reported or observed  Perception WDL  Hallucination None reported or observed  Judgment Poor  Confusion None  Danger to Self  Current suicidal ideation? Denies  Self-Injurious Behavior No self-injurious ideation or behavior indicators observed or expressed   Agreement Not to Harm Self Yes  Description of Agreement verbal  Danger to Others  Danger to Others None reported or observed

## 2024-09-01 NOTE — Group Note (Signed)
 Date:  09/01/2024 Time:  3:06 PM  Group Topic/Focus: Emotional Education   The group viewed a TED Talk on resilience, highlighting coping with adversity and cultivating personal strengths. The discussion provided a safe space for patients to reflect on past adverse events and explore strategies for managing challenges, focusing on controllable factors, and drawing on personal strengths and support. The group reinforced the concept that resilience is a skill that can be learned and practiced.   Participation Level:  Did Not Attend  Participation Quality:  N/A  Affect:  N/A  Cognitive:  N/A  Insight: None  Engagement in Group:  None  Modes of Intervention:  N/A  Additional Comments:  Manuel [name] did not attend this wellness group.  Kristi HERO Geoffrey Mankin 09/01/2024, 3:06 PM

## 2024-09-01 NOTE — Progress Notes (Signed)
(  Sleep Hours) -7.5 (Any PRNs that were needed, meds refused, or side effects to meds)- Trazodone  and nicotine  gum (Any disturbances and when (visitation, over night)-none (Concerns raised by the patient)- none (SI/HI/AVH)-denied

## 2024-09-01 NOTE — [facility] Group Notes (Signed)
 [facility] Group Notes:  (Nursing/MHT/Case Management/Adjunct)  Date:  09/01/2024  Time:  4:56 AM  Type of Therapy:  Wrap-up group  Participation Level:  Active  Participation Quality:  Appropriate  Affect:  Appropriate  Cognitive:  Appropriate  Insight:  Appropriate  Engagement in Group:  Engaged  Modes of Intervention:  Education  Summary of Progress/Problems: Goal D/C planning. Rated day 9/10.  Manuel [name] [place] 09/01/2024, 4:56 AM

## 2024-09-01 NOTE — Group Note (Signed)
 Date:  09/01/2024 Time:  5:47 PM  Group Topic/Focus:  Dimensions of Wellness:   The focus of this group is to introduce the topic of wellness using collage as a creative outlet for expressing emotions, reducing anxiety, fostering group cohesion , and enabling personal insight and healing.    Participation Level:  Active  Participation Quality:  Appropriate and Attentive  Affect:  Appropriate  Cognitive:  Alert and Appropriate  Insight: Appropriate and Good  Engagement in Group:  Engaged and Improving  Modes of Intervention:  Activity, Discussion, Exploration, Rapport Building, Socialization, and Support   Berwyn GORMAN Acosta 09/01/2024, 5:47 PM

## 2024-09-01 NOTE — Plan of Care (Signed)

## 2024-09-01 NOTE — Group Note (Signed)
 Date:  09/01/2024 Time:  9:32 AM  Group Topic/Focus: Goals Group  Patients participated in a goals group focused on setting SMART goals related to recovery. The group began with an psychiatrist using a beach ball with questions to promote engagement and build rapport. Patients then reviewed the components of SMART goals and completed a goals worksheet individually. Participants were encouraged to share their personal SMART goals with the group to promote insight, accountability, and peer support.  Participation Level:  Active  Participation Quality:  Appropriate, Attentive, and Sharing  Affect:  Appropriate  Cognitive:  Alert and Appropriate  Insight: Appropriate and Improving  Engagement in Group:  Engaged and Improving  Modes of Intervention:  Discussion, Exploration, and Socialization  Additional Comments:  Manuel [name] came in late, but he attended and actively participated in goals group. He shared his goals with the group.  [name] 09/01/2024, 9:32 AM

## 2024-09-01 NOTE — Group Note (Signed)
 Date:  09/01/2024 Time:  8:42 PM  Group Topic/Focus:  Wrap-Up Group:   The focus of this group is to help patients review their daily goal of treatment and discuss progress on daily workbooks.    Participation Level:  Active  Participation Quality:  Appropriate  Affect:  Appropriate  Cognitive:  Appropriate  Insight: Improving  Engagement in Group:  Engaged  Modes of Intervention:  Discussion  Additional Comments:  Pt attended the evening wrap-up group. Tech introduced the staff for the evening, reminded group of the evening schedule and reminded them to ask for anything they need. Pt read and discussed a condensed version of the story named [employer]. The pt helped read the story aloud and shared his perspective.  Manuel [name] 09/01/2024, 8:42 PM

## 2024-09-01 NOTE — Group Note (Signed)
 [facility] LCSW Group Therapy Note   Group Date: 09/01/2024 Start Time: 1000 End Time: 1058   Type of Therapy/Topic:  Group Therapy:  Balance in Life  Participation Level:  None   Description of Group:    This group will address the concept of balance and how it feels and looks when one is unbalanced. Patients will be encouraged to process areas in their lives that are out of balance, and identify reasons for remaining unbalanced. Facilitators will guide patients utilizing problem- solving interventions to address and correct the stressor making their life unbalanced. Understanding and applying boundaries will be explored and addressed for obtaining  and maintaining a balanced life. Patients will be encouraged to explore ways to assertively make their unbalanced needs known to significant others in their lives, using other group members and facilitator for support and feedback.  Therapeutic Goals: Patient will identify two or more emotions or situations they have that consume much of in their lives. Patient will identify signs/triggers that life has become out of balance:  Patient will identify two ways to set boundaries in order to achieve balance in their lives:  Patient will demonstrate ability to communicate their needs through discussion and/or role plays  Summary of Patient Progress:    Pt was present during group, he was very attentive to discussion. He did not participate in discussion.     Therapeutic Modalities:   Cognitive Behavioral Therapy Solution-Focused Therapy Assertiveness Training   Golda Louder, LCSW

## 2024-09-02 MED ORDER — FLUVOXAMINE MALEATE 50 MG PO TABS
50.0000 mg | ORAL_TABLET | Freq: Two times a day (BID) | ORAL | Status: DC
  Administered 2024-09-02 – 2024-09-03 (×2): 50 mg via ORAL
  Filled 2024-09-02 (×2): qty 1

## 2024-09-02 NOTE — Progress Notes (Signed)
   09/02/24 2000  Psych Admission Type (Psych Patients Only)  Admission Status Voluntary  Psychosocial Assessment  Patient Complaints Anxiety  Eye Contact Fair  Facial Expression Anxious  Affect Appropriate to circumstance  Speech Logical/coherent  Interaction Assertive  Motor Activity Other (Comment) (wnl)  Appearance/Hygiene Unremarkable  Behavior Characteristics Cooperative;Appropriate to situation;Anxious  Mood Anxious;Pleasant  Thought Process  Coherency WDL  Content WDL  Delusions None reported or observed  Perception WDL  Hallucination None reported or observed  Judgment WDL  Confusion None  Danger to Self  Current suicidal ideation? Denies  Agreement Not to Harm Self Yes  Description of Agreement verbal  Danger to Others  Danger to Others None reported or observed   Progress note   D: Pt seen interacting with peers. Pt denies SI, HI, AVH. Pt rates pain  0/10. Pt rates anxiety  2-3/10 and depression  0/10. Pt states that he believes that his time here has been helpful for him. I thought at first that I didn't need to be here but today I started thinking, wow, I've learned something. Pt says he doctors here have listened to his concerns about his tinnitus and he is pleased that they have started a medication with a lower incidence of making his ear issues worse. Pt has some hesitancy about weaning off of benzodiazepines. Experiencing some tremors. Reports that his HR is chronically low and has been all his life. Asymptomatic. No other concerns noted at this time.  A: Pt provided support and encouragement. Pt given scheduled medication as prescribed. PRNs as appropriate. Q15 min checks for safety.   R: Pt safe on the unit. Will continue to monitor.

## 2024-09-02 NOTE — Progress Notes (Signed)
   09/02/24 0910  Psych Admission Type (Psych Patients Only)  Admission Status Voluntary  Psychosocial Assessment  Patient Complaints Isolation  Eye Contact Fair  Facial Expression Animated  Affect Appropriate to circumstance  Speech Soft  Interaction Isolative  Motor Activity Other (Comment) (WDL)  Appearance/Hygiene Unremarkable  Behavior Characteristics Appropriate to situation;Calm;Cooperative  Mood Pleasant  Thought Process  Coherency WDL  Content WDL  Delusions None reported or observed  Perception WDL  Hallucination None reported or observed  Judgment Poor  Confusion None  Danger to Self  Current suicidal ideation? Denies  Agreement Not to Harm Self Yes  Description of Agreement Verbal  Danger to Others  Danger to Others None reported or observed

## 2024-09-02 NOTE — Group Note (Signed)
 Date:  09/02/2024 Time:  10:57 AM  Group Topic/Focus:  Goals Group:   The focus of this group is to help patients establish daily goals to achieve during treatment and discuss how the patient can incorporate goal setting into their daily lives to aide in recovery.    Participation Level:  Active  Participation Quality:  Appropriate  Affect:  Appropriate  Cognitive:  Appropriate  Insight: Appropriate  Engagement in Group:  Engaged  Modes of Intervention:  Discussion  Additional Comments:  to relax  Nat Rummer 09/02/2024, 10:57 AM

## 2024-09-02 NOTE — Plan of Care (Signed)
   Problem: Education: Goal: Knowledge of [place] [place] [facility] Education information/materials will improve Outcome: Progressing Goal: Emotional status will improve Outcome: Progressing Goal: Mental status will improve Outcome: Progressing Goal: Verbalization of understanding the information provided will improve Outcome: Progressing

## 2024-09-02 NOTE — Progress Notes (Signed)
 [facility] MD Progress Note  09/02/2024 8:09 AM Manuel [name]  MRN:  [id number] Principal Problem: Obsessive compulsive disorder Diagnosis: Principal Problem:   Obsessive compulsive disorder Active Problems:   Major depressive disorder, recurrent episode, moderate degree (HCC)  Subjective:   CC: I took way too much Kratom and I have had terrible depression   History of Present Illness: Manuel [name] is a 23 yo male with past psychiatric history significant for major depressive disorder and medical history significant for Crohn's disease s/p multiple bowel obstructions resulting in bowel resection, family history of aortic dissection, long term use of opioids, and most recently, sensorineural hearing loss and severe worsening of his 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  Overnight events: Slept 7.5 hours. Denied SI, HI, AVH. No concerns or refusals. Hydroxyzine 25 mg x1.   Per Patient: Patient mood is  better.  Patient not noticing any side effects from medications, feels that he is progressing well on current therapy regimen.  Denied SI, HI, AVH. Would like to continue extremely slow clonazepam taper.  Patient is noticeably [place] today  Sleep: Good sleep  Appetite:  Fair Depression: Improving Anxiety: Improving Auditory Hallucinations: denies, not seen responding Visual Hallucinations: denies, not seen responding HI: denies SI: denies Side effects from medications: does not endorse any side-effects they attribute to medications. Other concerns discussed with patient: Game of thrones, other fantasy novels  Review of Systems  Constitutional:  Negative for chills, diaphoresis, fever, malaise/fatigue and weight loss.  Respiratory:  Negative for cough.   Cardiovascular:  Negative for chest pain.  Gastrointestinal:  Negative for abdominal pain, constipation, diarrhea, nausea and vomiting.  Genitourinary:  Negative for urgency.  Psychiatric/Behavioral:  Positive for depression and  substance abuse. Negative for hallucinations and suicidal ideas. The patient is nervous/anxious and has insomnia.     Total time spent with patient: 30 minutes  HX from H&P:  Previous Psych Diagnoses: Suicidal ideations, anxiety, MDD, Asperger Syndrome, OCD, anxiety and depression.  Prior inpatient treatment: [facility] trips x2 Current/prior outpatient treatment: sees Dr Ismael Franco Prior rehab hx: denies Psychotherapy hx: denies, has appt 11/12 History of suicide: denies, collateral denies History of homicide or aggression: denies, collateral denies Psychiatric medication history: lexapro, trazodone , ketamine, olanzapine, methocarbamol  Psychiatric medication compliance history: poor per mother (hx from [facility]) Neuromodulation history: denies  Current Psychiatrist: Dr Charlanne Current therapist: starting with tinnitus therapist 11/12   Substance Abuse Hx: Alcohol: tried, but has never used consistently. Does not enjoy Tobacco: vapes nicotine  hourly Illicit drugs:  Marijuana/THC: 3x a week Cocaine: denies Methamphetamines: denies Benzodiazepines: denies Opioids: patient gives inconsistent history on this.  Kratom: yes, patient reports escalating recent history of kratom use Hallucinogens: denies Bath salts: denies Prescription Drug abuse problems:    Past Medical History: Medical Diagnoses: Crohns Disease on long term biologic therapy, status post gut resection in 12/2023, marfanoid hyperflexibility, sensorineural hearing loss of right ear. Family history of aortic dissection. Some old notes imply Marfan's syndrome? Home Rx: see below Prior Hosp: multiple for gut-related problems Prior Surgeries/Trauma: small bowel resection 12/2023 Head trauma, LOC, concussions, seizures: denies Allergies: denies   Family History: Medical: Family history of aortic dissection Psych: patient unaware.  Psych Rx: none known SA/HA: denies Substance use family hx: denies   Social History: Childhood  (bring, raised, lives now, parents, siblings, schooling, education): Abuse: denies Marital Status: never married Sexual orientation: Children: denies Education: medical laboratory scientific officer from [employer] Employment: recently quit position as physiological scientist Peer Group: has several close friends  that he stays in touch with from high school and university Housing: Patient currently lives with mother. Firearms: No access to guns.  Finances: mild strain, unemployed Legal: no pending issues. No historic Museum/gallery Exhibitions Officer: denies affiliation     Past Medical History (auto-populated):  Past Medical History:  Diagnosis Date   Allergy    Anxiety    Constipation since about age 16   Depression    Eczema     Past Surgical History:  Procedure Laterality Date   BOWEL RESECTION  07/2015   COLONOSCOPY N/A 12/29/2012   Procedure: COLONOSCOPY;  Surgeon: Fairy VEAR Gaskins, MD;  Location: [facility] OR;  Service: Gastroenterology;  Laterality: N/A;   COLONOSCOPY N/A 12/29/2012   Procedure: COLONOSCOPY;  Surgeon: Fairy VEAR Gaskins, MD;  Location: [facility] OR;  Service: Gastroenterology;  Laterality: N/A;   ESOPHAGOGASTRODUODENOSCOPY N/A 12/29/2012   Procedure: ESOPHAGOGASTRODUODENOSCOPY (EGD);  Surgeon: Fairy VEAR Gaskins, MD;  Location: [facility] OR;  Service: Gastroenterology;  Laterality: N/A;   ESOPHAGOGASTRODUODENOSCOPY Bilateral 12/29/2012   Procedure: ESOPHAGOGASTRODUODENOSCOPY (EGD);  Surgeon: Fairy VEAR Gaskins, MD;  Location: [facility] OR;  Service: Gastroenterology;  Laterality: Bilateral;    Social History:  Social History   Substance and Sexual Activity  Alcohol Use Never   Alcohol/week: 8.0 standard drinks of alcohol   Types: 8 Cans of beer per week   Comment: pt stated during admission that he does not use alcohol     Social History   Substance and Sexual Activity  Drug Use Yes   Types: Marijuana, Other-see comments   Comment: kratum for 8 days, never used kratum before, anxious about tinnitis    Social History    Socioeconomic History   Marital status: Single    Spouse name: Not on file   Number of children: Not on file   Years of education: 16   Highest education level: Bachelor's degree (e.g., BA, AB, BS)  Occupational History   Occupation: Student  Tobacco Use   Smoking status: Every Day    Current packs/day: 1.00    Types: Cigarettes   Smokeless tobacco: Former    Quit date: 08/2024   Tobacco comments:    Vapes tobacco every day      uses THC since 23 yrs old, occasionally twice week  Vaping Use   Vaping status: Every Day   Last attempt to quit: 08/29/2024   Devices: vapes tobacco every day  Substance and Sexual Activity   Alcohol use: Never    Alcohol/week: 8.0 standard drinks of alcohol    Types: 8 Cans of beer per week    Comment: pt stated during admission that he does not use alcohol   Drug use: Yes    Types: Marijuana, Other-see comments    Comment: kratum for 8 days, never used kratum before, anxious about tinnitis   Sexual activity: Never  Other Topics Concern   Not on file  Social History Narrative   Consulting Civil Engineer at [employer]   Social Drivers of Health   Financial Resource Strain: Not on file  Food Insecurity: No Food Insecurity (08/29/2024)   Hunger Vital Sign    Worried About Running Out of Food in the Last Year: Never true    Ran Out of Food in the Last Year: Never true  Transportation Needs: No Transportation Needs (08/29/2024)   PRAPARE - Administrator, Civil Service (Medical): No    Lack of Transportation (Non-Medical): No  Physical Activity: Not on file  Stress: Not on file  Social Connections:  Not on file   Additional Social History:   Objective: Medications, Labs, Mental Status Exam, Physical Exam Current Medications: Current Facility-Administered Medications  Medication Dose Route Frequency Provider Last Rate Last Admin   acetaminophen  (TYLENOL ) tablet 650 mg  650 mg Oral Q6H PRN Olasunkanmi, Oluwatosin, NP       alum & mag  hydroxide-simeth (MAALOX/MYLANTA) 200-200-20 MG/5ML suspension 30 mL  30 mL Oral Q4H PRN Olasunkanmi, Oluwatosin, NP       clonazepam (KLONOPIN) disintegrating tablet 0.375 mg  0.375 mg Oral QHS Delsie Lynwood Morene Lavone, MD   0.375 mg at 09/01/24 2058   cloNIDine (CATAPRES) tablet 0.1 mg  0.1 mg Oral QAC breakfast Olasunkanmi, Oluwatosin, NP       haloperidol (HALDOL) tablet 5 mg  5 mg Oral TID PRN Olasunkanmi, Oluwatosin, NP       And   diphenhydrAMINE (BENADRYL) capsule 50 mg  50 mg Oral TID PRN Olasunkanmi, Oluwatosin, NP       diphenhydrAMINE (BENADRYL) capsule 50 mg  50 mg Oral Q8H PRN Olasunkanmi, Oluwatosin, NP       haloperidol lactate (HALDOL) injection 5 mg  5 mg Intramuscular TID PRN Olasunkanmi, Oluwatosin, NP       And   diphenhydrAMINE (BENADRYL) injection 50 mg  50 mg Intramuscular TID PRN Olasunkanmi, Oluwatosin, NP       And   LORazepam (ATIVAN) injection 2 mg  2 mg Intramuscular TID PRN Olasunkanmi, Oluwatosin, NP       haloperidol lactate (HALDOL) injection 10 mg  10 mg Intramuscular TID PRN Olasunkanmi, Oluwatosin, NP       And   diphenhydrAMINE (BENADRYL) injection 50 mg  50 mg Intramuscular TID PRN Olasunkanmi, Oluwatosin, NP       And   LORazepam (ATIVAN) injection 2 mg  2 mg Intramuscular TID PRN Olasunkanmi, Oluwatosin, NP       fluvoxaMINE (LUVOX) tablet 25 mg  25 mg Oral q AM Rollene Morene, MD   25 mg at 09/02/24 0645   And   fluvoxaMINE (LUVOX) tablet 50 mg  50 mg Oral QHS Crawford, Benjamin, MD   50 mg at 09/01/24 1634   hydrOXYzine (ATARAX) tablet 25 mg  25 mg Oral Q6H PRN Olasunkanmi, Oluwatosin, NP   25 mg at 09/01/24 2103   magnesium hydroxide (MILK OF MAGNESIA) suspension 30 mL  30 mL Oral Daily PRN Olasunkanmi, Oluwatosin, NP       nicotine  (NICODERM CQ  - dosed in mg/24 hours) patch 21 mg  21 mg Transdermal Daily Bouchard, Marc A, DO   21 mg at 09/01/24 0816   nicotine  polacrilex (NICORETTE) gum 2 mg  2 mg Oral PRN Prentis Kitchens A, DO   2 mg at  09/01/24 9050   saccharomyces boulardii (FLORASTOR) capsule 250 mg  250 mg Oral BID Delsie Lynwood Morene Lavone, MD   250 mg at 09/01/24 2102   traZODone  (DESYREL ) tablet 100 mg  100 mg Oral QHS PRN Delsie Lynwood Morene Lavone, MD   100 mg at 09/01/24 2103   Vitamin D (Ergocalciferol) (DRISDOL) 1.25 MG (50000 UNIT) capsule 50,000 Units  50,000 Units Oral Q7 days Delsie Lynwood Morene Lavone, MD   50,000 Units at 09/01/24 9051    Lab Results:  No results found for this or any previous visit (from the past 48 hours).   Blood Alcohol level:  Lab Results  Component Value Date   [facility] <15 08/27/2024    Metabolic Disorder Labs: Lab Results  Component Value Date  HGBA1C 4.7 (L) 08/14/2024   MPG 88.19 08/14/2024   No results found for: PROLACTIN Lab Results  Component Value Date   CHOL 136 08/14/2024   TRIG 115 08/14/2024   HDL 57 08/14/2024   CHOLHDL 2.4 08/14/2024   VLDL 23 08/14/2024   LDLCALC 56 08/14/2024   LDLCALC 59 12/29/2012    Physical Findings: AIMS:  , ,  ,  ,    CIWA:  CIWA-Ar Total: 4 COWS:  COWS Total Score: 0  Musculoskeletal: Strength & Muscle Tone: within normal limits Gait & Station: normal Patient leans: N/A   Psychiatric Specialty Exam: Mental Status Exam: General Appearance and Behavior: Tall, thin, pale caucasian young adult male.  Patient does not appear to have shaved or showered in the last 24 hours.  He appears extremely anxious  Orientation:  Full (Time, Place, and Person)  Memory:  Grossly intact  Attention: Good  Eye Contact:  Good  Speech:  Clear and Coherent and rapid but not pressured  Language:  Good  Volume:  Normal  Mood: I feel very anxious about a lot of different things  Affect:  Appropriate and Full Range  Thought Process: More linear than yesterday, though patient describes himself as less than organized than yesterday  Thought Content:  Obsessions (tinnitus), Rumination (every possible thing that he has done that  could have contributed to his tinnitus), and Tangential (all things go back to tinnitus)  Suicidal Thoughts:  No  Homicidal Thoughts:  No  Judgement:  Fair  Insight: Shallow  Psychomotor Activity:  Normal  Akathisia:  No  Fund of Knowledge:  Good Assets:  Communication Skills Desire for Improvement Financial Resources/Insurance Housing Intimacy Leisure Time Physical Health Resilience Social Support  Cognition:  WNL  ADL's:  Intact    Details about paranoia, delusions, or hallucinations:  Physical Exam: Physical Exam Vitals and nursing note reviewed.  Constitutional:      General: He is not in acute distress.    Appearance: Normal appearance. He is not ill-appearing.  HENT:     Head: Normocephalic and atraumatic.  Pulmonary:     Effort: Pulmonary effort is normal.  Skin:    General: Skin is warm and dry.  Neurological:     General: No focal deficit present.     Mental Status: He is alert and oriented to person, place, and time.     Sensory: No sensory deficit.  Psychiatric:        Behavior: Behavior normal.     Blood pressure 111/76, pulse (!) 51, temperature 97.8 F (36.6 C), temperature source Oral, resp. rate 16, height 6' 2 (1.88 m), weight 73.5 kg, SpO2 100%. Body mass index is 20.8 kg/m.  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   Mr Pursifull presentation meets full diagnostic criteria for multiple issues, including major depressive disorder (which he may have had for years), obsessive compulsive disorder (which appears to have started in March 2025 after gut resection), and adjustment disorder with panic  attacks (since approximately September after the acute worsening of his sensorineural hearing loss).He also meets criteria for cannabis use disorder, nicotine  use disorder, and while there is no formal classification, kratom use disorder.  Patient has had a debilitating mental health deterioration leading to loss of job, loss of independence, and worsening obsessions and rumination on his loss.   11/7: Patient reports he is doing better overall.  There are some signs that this is the case indicating improved concentration during discussions, less tangents, ability to be redirected onto the topic.  Patient needs to constantly have this hospitalization reframed as treating for obsessive-compulsive disorder, not the tinnitus.  Patient requires significant reassurance, and is extremely knowledgeable and intelligent.  Needs and wants hard facts and data but has a tendency to ruminate.  Critical to be clear and direct before you present a plan to him.  11/8: Patient appears to continue to improve, with better sleep and energy. I think his overall subjective distress is minimal. Re-enforced again that we were treating his OCD. Will increase Fluvoxamine from 25 BID to 25 qAM + 50 qPM. Denied suicidal and homicidal ideations. Likely appropriate for DC Monday. Bradycardia appears to be baseline, on my exam was 50 BPM. No somatic symptomatology. No side effects.   11/9: Patient improving on more sleep.  Noted that his tinnitus seems to be better today.  He reports being able to focus on different topics and is not quite as obsessed with his symptoms.  Did not have to reframe this hospitalization today for him.  Increasing fluvoxamine to 50 mg twice daily, will let his outpatient psychiatrist continue that increase and deal with the clonazepam taper.  Diagnoses / Active Problems: Obsessive-compulsive disorder Major depressive disorder without psychotic features Generalized anxiety disorder Cannabis use  disorder Other unspecified substance use disorder (kratom)  PLAN: Safety and Monitoring:  --  Voluntary admission to inpatient psychiatric unit for safety, stabilization and treatment  -- Daily contact with patient to assess and evaluate symptoms and progress in treatment  -- Patient's case to be discussed in multi-disciplinary team meeting  -- Observation Level : q15 minute checks  -- Vital signs:  q12 hours  -- Precautions: suicide, elopement, and assault  2. Psychiatric Diagnoses and Treatment:  Increase fluvoxamine 50 mg twice daily  continue clonazepam 0.375 mg at bedtime for nighttime panic attacks Continue methocarbamol  500 mg Q8PRN for muscle spasms Continue saccaromyces boulardii (florastor probiotic) 250 mg BID for gut biota improvement (depression) Continue hydroxyzine 25 mg Q6 PRN for anxiety    The risks/benefits/side-effects/alternatives to this medication were discussed in detail with the patient and time was given for questions. The patient consents to medication trial.  Metabolic profile and EKG monitoring obtained while on an atypical antipsychotic Body mass index is 20.8 kg/m. Lipid Panel: Within normal limits on 08/14/24 Hbg A1c: 4.7 (Within normal limits) on 08/14/24 QTc: 373 ms, last obtained 08/29/2024 Encouraged patient to participate in unit milieu and in scheduled group therapies   -- Short Term Goals: Ability to identify changes in lifestyle to reduce recurrence of condition will improve, Ability to verbalize feelings will improve, Ability to demonstrate self-control will improve, and Ability to identify and develop effective coping behaviors will improve  -- Long Term Goals: Improvement in symptoms so as ready for discharge    3. Medical Issues Being Addressed:  Crohn's disease Immunosuppression (long-term use of Biologics) Sensorineural hearing loss of Right ear Tinnitus, chronic Labs reviewed, notable for low vitamin D, otherwise  unremarkable  Tobacco Use Disorder  --  Patient in need of nicotine  replacement; nicotine  polacrilex (gum) ordered. Smoking cessation encouraged  -- Smoking cessation encouraged  Hypovitaminosis D - Continue ergocalciferol 50,000U qWeek.  4. Discharge Planning:   -- Social work and case management to assist with discharge planning and identification of hospital follow-up needs prior to discharge  -- Estimated discharge: 11/10  -- Discharge Concerns: Need to establish a safety plan; Medication compliance and effectiveness  -- Discharge Goals: Return home with outpatient referrals for mental health follow-up including medication management/psychotherapy   Lynwood Morene Lavone Delsie, MD 09/02/2024, 8:09 AM

## 2024-09-02 NOTE — Group Note (Signed)
 Date:  09/02/2024 Time:  11:07 PM  Group Topic/Focus:  Wrap-Up Group:   The focus of this group is to help patients review their daily goal of treatment and discuss progress on daily workbooks.    Participation Level:  Active  Participation Quality:  Appropriate  Affect:  Appropriate  Cognitive:  Appropriate  Insight: Appropriate  Engagement in Group:  Engaged  Modes of Intervention:  Discussion  Additional Comments:   Pt states that he is proud of himself for sleeping through the previous night. Pt states he feels a lot better from when he first got here. Pt participated in programming and recreational activities. Pt endorsed anxiety at a 2  Sharlett Lienemann A Oda Lansdowne 09/02/2024, 11:07 PM

## 2024-09-02 NOTE — Plan of Care (Signed)
  Problem: Education: Goal: Knowledge of [facility] Education information/materials will improve 09/02/2024 1553 by Lesli Ronnald HERO, RN Outcome: Progressing 09/02/2024 1553 by Lesli Ronnald HERO, RN Outcome: Progressing Goal: Emotional status will improve 09/02/2024 1553 by Lesli Ronnald HERO, RN Outcome: Progressing 09/02/2024 1553 by Lesli Ronnald HERO, RN Outcome: Progressing Goal: Mental status will improve 09/02/2024 1553 by Lesli Ronnald HERO, RN Outcome: Progressing 09/02/2024 1553 by Lesli Ronnald HERO, RN Outcome: Progressing Goal: Verbalization of understanding the information provided will improve 09/02/2024 1553 by Lesli Ronnald HERO, RN Outcome: Progressing 09/02/2024 1553 by Lesli Ronnald HERO, RN Outcome: Progressing   Problem: Activity: Goal: Interest or engagement in activities will improve 09/02/2024 1553 by Lesli Ronnald HERO, RN Outcome: Progressing 09/02/2024 1553 by Lesli Ronnald HERO, RN Outcome: Progressing Goal: Sleeping patterns will improve 09/02/2024 1553 by Lesli Ronnald HERO, RN Outcome: Progressing 09/02/2024 1553 by Lesli Ronnald HERO, RN Outcome: Progressing

## 2024-09-02 NOTE — Progress Notes (Signed)
(  Sleep Hours) - (Any PRNs that were needed, meds refused, or side effects to meds)-  (Any disturbances and when (visitation, over night)- (Concerns raised by the patient)-  (SI/HI/AVH)-

## 2024-09-02 NOTE — Group Note (Signed)
 Date:  09/02/2024 Time:  4:54 PM  Group Topic/Focus:  Overcoming Stress:   The focus of this group is learning gratitude to overcome stress.    Participation Level:  Active  Participation Quality:  Appropriate  Affect:  Appropriate  Cognitive:  Alert  Insight: Appropriate  Engagement in Group:  Engaged  Modes of Intervention:  Discussion    Manuel [name] [name] 09/02/2024, 4:54 PM

## 2024-09-02 NOTE — BH Assessment (Signed)
(  Sleep Hours) - 7.75 (Any PRNs that were needed, meds refused, or side effects to meds)-  (Any disturbances and when (visitation, over night)- None (Concerns raised by the patient)- None (SI/HI/AVH)- Denies

## 2024-09-03 DIAGNOSIS — F331 Major depressive disorder, recurrent, moderate: Principal | ICD-10-CM

## 2024-09-03 DIAGNOSIS — F429 Obsessive-compulsive disorder, unspecified: Secondary | ICD-10-CM

## 2024-09-03 MED ORDER — CLONIDINE HCL 0.1 MG PO TABS
0.1000 mg | ORAL_TABLET | Freq: Every day | ORAL | 11 refills | Status: AC
Start: 2024-09-04 — End: ?

## 2024-09-03 MED ORDER — CLONAZEPAM 0.125 MG PO TBDP: 0.3750 mg | ORAL_TABLET | Freq: Every day | ORAL | 0 refills | Status: AC

## 2024-09-03 MED ORDER — NICOTINE 14 MG/24HR TD PT24: 14.0000 mg | MEDICATED_PATCH | Freq: Every day | TRANSDERMAL | 3 refills | Status: AC

## 2024-09-03 MED ORDER — VITAMIN D (ERGOCALCIFEROL) 1.25 MG (50000 UNIT) PO CAPS
50000.0000 [IU] | ORAL_CAPSULE | ORAL | 0 refills | Status: AC
Start: 2024-09-08 — End: ?

## 2024-09-03 MED ORDER — HYDROXYZINE HCL 25 MG PO TABS: 25.0000 mg | ORAL_TABLET | Freq: Four times a day (QID) | ORAL | 0 refills | Status: AC | PRN

## 2024-09-03 MED ORDER — FLUVOXAMINE MALEATE 50 MG PO TABS: 50.0000 mg | ORAL_TABLET | Freq: Two times a day (BID) | ORAL | 0 refills | Status: AC

## 2024-09-03 MED ORDER — POLYETHYLENE GLYCOL 3350 17 G PO PACK
34.0000 g | PACK | Freq: Once | ORAL | Status: AC
  Administered 2024-09-03: 34 g via ORAL
  Filled 2024-09-03: qty 2

## 2024-09-03 MED ORDER — SACCHAROMYCES BOULARDII 250 MG PO CAPS: 250.0000 mg | ORAL_CAPSULE | Freq: Two times a day (BID) | ORAL | 0 refills | Status: AC

## 2024-09-03 MED ORDER — TRAZODONE HCL 100 MG PO TABS
100.0000 mg | ORAL_TABLET | Freq: Every evening | ORAL | 0 refills | Status: AC | PRN
Start: 2024-09-03 — End: ?

## 2024-09-03 NOTE — Group Note (Signed)
 Date:  09/03/2024 Time:  10:23 AM  Group Topic/Focus:  Emotional Goals and Wellness orientation  Goals Group:   The focus of this group is to help patients establish daily goals to achieve during treatment and discuss how the patient can incorporate goal setting into their daily lives to aide in recovery. Orientation:   The focus of this group is to educate the patient on the purpose and policies of crisis stabilization and provide a format to answer questions about their admission.  The group details unit policies and expectations of patients while admitted.    Participation Level:  Minimal  Participation Quality:  Appropriate  Affect:  Appropriate  Cognitive:  Alert and Appropriate  Insight: Appropriate and Good  Engagement in Group:  Engaged and Improving  Modes of Intervention:  Discussion and Orientation  Additional Comments:  Patient was in group for a few in and out  [employer] 09/03/2024, 10:23 AM

## 2024-09-03 NOTE — Plan of Care (Signed)
 Pt was out in the milieu during the day acting appropriately. Went to [employer] and ate adequately. Attending group activity and participated. Denies SI/HI/SH/paranoia/AVH.

## 2024-09-03 NOTE — Transportation (Signed)
 09/03/2024  Manuel [name] DOB: 17-May-2001 MRN: [id number]   RIDER WAIVER AND RELEASE OF LIABILITY  For the purposes of helping with transportation needs, [facility] partners with outside transportation providers (taxi companies, [place], catering manager.) to give [facility] patients or other approved people the choice of on-demand rides Public Librarian) to our buildings for non-emergency visits.  By using [employer], I, the person signing this document, on behalf of myself and/or any legal minors (in my care using the [employer]), agree:  Science Writer given to me are supplied by independent, outside transportation providers who do not work for, or have any affiliation with, [employer]. [facility] is not a transportation company. [facility] has no control over the quality or safety of the rides I get using [employer]. [facility] has no control over whether any outside ride will happen on time or not. [facility] gives no guarantee on the reliability, quality, safety, or availability on any rides, or that no mistakes will happen. I know and accept that traveling by vehicle (car, truck, SVU, fleeta, bus, taxi, etc.) has risks of serious injuries such as disability, being paralyzed, and death. I know and agree the risk of using [employer] is mine alone, and not [employer]. [employer] are provided as is and as are available. The transportation providers are in charge for all inspections and care of the vehicles used to provide these rides. I agree not to take legal action against [facility], its agents, employees, officers, directors, representatives, insurers, attorneys, assigns, successors, subsidiaries, and affiliates at any time for any reasons related directly or indirectly to using [employer]. I also agree not to take legal action against [facility] or its affiliates for any injury, death, or damage to property caused by or related to using  [employer]. I have read this Waiver and Release of Liability, and I understand the terms used in it and their legal meaning. This Waiver is freely and voluntarily given with the understanding that my right (or any legal minors) to legal action against [facility] relating to [employer] is knowingly given up to use these services.   I attest that I read the Ride Waiver and Release of Liability to Manuel [name], gave [name] the opportunity to ask questions and answered the questions asked (if any). I affirm that [name] then provided consent for assistance with transportation.

## 2024-09-03 NOTE — Plan of Care (Signed)
  Problem: Education: Goal: Verbalization of understanding the information provided will improve Outcome: Progressing   Problem: Activity: Goal: Interest or engagement in activities will improve Outcome: Progressing Goal: Sleeping patterns will improve Outcome: Progressing   Problem: Coping: Goal: Ability to demonstrate self-control will improve Outcome: Progressing   Problem: Health Behavior/Discharge Planning: Goal: Compliance with treatment plan for underlying cause of condition will improve Outcome: Progressing   Problem: Safety: Goal: Periods of time without injury will increase Outcome: Progressing   Problem: Education: Goal: Emotional status will improve Outcome: Progressing Goal: Mental status will improve Outcome: Progressing

## 2024-09-03 NOTE — Discharge Summary (Signed)
 Physician Discharge Summary Note  Patient:  Manuel [name] is an 23 y.o., male MRN:  [id number] DOB:  07-05-2001 Patient phone:  [phone] (home)  Patient address:   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 He presented for worsening symptoms of obsessive compulsive disorder.  Principal Problem: Obsessive compulsive disorder Discharge Diagnoses: Principal Problem:   Obsessive compulsive disorder Active Problems:   Major depressive disorder, recurrent episode, moderate degree (HCC)   Past Psychiatric History:  HX from H&P:  Previous Psych Diagnoses: Suicidal ideations, anxiety, MDD, Asperger Syndrome, OCD, anxiety and depression.  Prior inpatient treatment: [facility] trips x2 Current/prior outpatient treatment: sees Dr Ismael Franco Prior rehab hx: denies Psychotherapy hx: denies, has appt 11/12 History of suicide: denies, collateral denies History of homicide or aggression: denies, collateral denies Psychiatric medication history: lexapro, trazodone , ketamine, olanzapine, methocarbamol  Psychiatric medication compliance history: poor per mother (hx from [facility]) Neuromodulation history: denies  Current Psychiatrist: Dr Charlanne Current therapist: starting with tinnitus therapist 11/12   Substance Abuse Hx: Alcohol: tried, but has never used consistently. Does not enjoy Tobacco: vapes nicotine  hourly Illicit drugs:  Marijuana/THC: 3x a week Cocaine:  denies Methamphetamines: denies Benzodiazepines: denies Opioids: patient gives inconsistent history on this.  Kratom: yes, patient reports escalating recent history of kratom use Hallucinogens: denies Bath salts: denies Prescription Drug abuse problems:    Past Medical History: Medical Diagnoses: Crohns Disease on long term biologic therapy, status post gut resection in 12/2023, marfanoid hyperflexibility, sensorineural hearing loss of right ear. Family history of aortic dissection. Some old notes imply Marfan's syndrome? Home Rx: see below Prior Hosp: multiple for gut-related problems Prior Surgeries/Trauma: small bowel resection 12/2023 Head trauma, LOC, concussions, seizures: denies Allergies: denies   Family History: Medical: Family history of aortic dissection Psych: patient unaware.  Psych Rx: none known SA/HA: denies Substance use family hx: denies   Social History: Childhood (bring, raised, lives now, parents, siblings, schooling, education): Abuse: denies Marital Status: never married Sexual orientation: Children: denies Education: medical laboratory scientific officer from [employer] Employment: recently quit position as physiological scientist Peer Group: has several close friends that he stays in touch with from high school and university Housing: Patient currently lives with mother. Firearms: No access to guns.  Finances: mild strain, unemployed Legal: no pending issues. No historic Museum/gallery Exhibitions Officer: denies affiliation  Past Medical History:  Past Medical History:  Diagnosis Date   Allergy    Anxiety    Constipation since about age 71   Depression    Eczema     Past Surgical History:  Procedure Laterality Date   BOWEL RESECTION  07/2015   COLONOSCOPY N/A 12/29/2012   Procedure: COLONOSCOPY;  Surgeon: Fairy VEAR Gaskins, MD;  Location: [facility] OR;  Service: Gastroenterology;  Laterality: N/A;   COLONOSCOPY N/A 12/29/2012   Procedure: COLONOSCOPY;  Surgeon: Fairy VEAR Gaskins, MD;   Location: [facility] OR;  Service: Gastroenterology;  Laterality: N/A;   ESOPHAGOGASTRODUODENOSCOPY N/A 12/29/2012   Procedure: ESOPHAGOGASTRODUODENOSCOPY (EGD);  Surgeon: Fairy VEAR Gaskins, MD;  Location: [facility] OR;  Service:  Gastroenterology;  Laterality: N/A;   ESOPHAGOGASTRODUODENOSCOPY Bilateral 12/29/2012   Procedure: ESOPHAGOGASTRODUODENOSCOPY (EGD);  Surgeon: Fairy VEAR Gaskins, MD;  Location: [facility] OR;  Service: Gastroenterology;  Laterality: Bilateral;   Family History:  Family History  Problem Relation Age of Onset   Depression Father    Birth defects Maternal Grandmother    Alcohol abuse Maternal Grandfather    Birth defects Maternal Grandfather    Social History:  Social History   Substance and Sexual Activity  Alcohol Use Never   Alcohol/week: 8.0 standard drinks of alcohol   Types: 8 Cans of beer per week   Comment: pt stated during admission that he does not use alcohol     Social History   Substance and Sexual Activity  Drug Use Yes   Types: Marijuana, Other-see comments   Comment: kratum for 8 days, never used kratum before, anxious about tinnitis    Social History   Socioeconomic History   Marital status: Single    Spouse name: Not on file   Number of children: Not on file   Years of education: 16   Highest education level: Bachelor's degree (e.g., BA, AB, BS)  Occupational History   Occupation: Student  Tobacco Use   Smoking status: Every Day    Current packs/day: 1.00    Types: Cigarettes   Smokeless tobacco: Former    Quit date: 08/2024   Tobacco comments:    Vapes tobacco every day      uses THC since 24 yrs old, occasionally twice week  Vaping Use   Vaping status: Every Day   Last attempt to quit: 08/29/2024   Devices: vapes tobacco every day  Substance and Sexual Activity   Alcohol use: Never    Alcohol/week: 8.0 standard drinks of alcohol    Types: 8 Cans of beer per week    Comment: pt stated during admission that he does not use alcohol   Drug use: Yes     Types: Marijuana, Other-see comments    Comment: kratum for 8 days, never used kratum before, anxious about tinnitis   Sexual activity: Never  Other Topics Concern   Not on file  Social History Narrative   Consulting Civil Engineer at [employer]   Social Drivers of Health   Financial Resource Strain: Not on file  Food Insecurity: No Food Insecurity (08/29/2024)   Hunger Vital Sign    Worried About Running Out of Food in the Last Year: Never true    Ran Out of Food in the Last Year: Never true  Transportation Needs: No Transportation Needs (08/29/2024)   PRAPARE - Administrator, Civil Service (Medical): No    Lack of Transportation (Non-Medical): No  Physical Activity: Not on file  Stress: Not on file  Social Connections: Not on file    Hospital Course:   During the patient's hospitalization, patient had extensive initial psychiatric evaluation, and follow-up psychiatric evaluations every day.  Psychiatric diagnoses provided upon initial assessment:  Major depressive disorder, recurrent episode, moderate degree (HCC) [F33.1]  Generalized anxiety disorder  Primary Psychiatric diagnosis changed to: Obsessive Compulsive Disorder, Severe Cannabis use disorder Tobacco use disorder Other unspecified substance use disorder (kratom)  Patient's psychiatric medications were adjusted on admission:  Discontinue zyprexa Decrease home clonazepam from 0.5 mg at bedtime to 0.25 mg at bedtime Continue methocarbamol  500 mg Q8PRN for muscle spasms Start saccaromyces boulardii (florastor probiotic) 250 mg BID for gut biota improvement (depression) Continue hydroxyzine 25 mg Q6 PRN for anxiety  During the  hospitalization, other adjustments were made to the patient's psychiatric medication regimen:  Started and titrated fluvoxamine for OCD Increased clonazepam due to reported patient side effects  Patient's care was discussed during the interdisciplinary team meeting every day during the  hospitalization.  The patient denied having side effects to prescribed psychiatric medication.  Gradually, patient started adjusting to milieu. The patient was evaluated each day by a clinical provider to ascertain response to treatment. Improvement was noted by the patient's report of decreasing symptoms, improved sleep and appetite, affect, medication tolerance, behavior, and participation in unit programming.  Patient was asked each day to complete a self inventory noting mood, mental status, pain, new symptoms, anxiety and concerns.   Symptoms were reported as significantly decreased or resolved completely by discharge.  The patient reports that their mood is stable.  The patient denied having suicidal thoughts for more than 48 hours prior to discharge.  Patient denies having homicidal thoughts.  Patient denies having auditory hallucinations.  Patient denies any visual hallucinations or other symptoms of psychosis.  The patient was motivated to continue taking medication with a goal of continued improvement in mental health.   The patient reports their target psychiatric symptoms of obsessions, anxiety, inability to stop worrying about his tinnitus responded well to the psychiatric medications, and the patient reports overall benefit other psychiatric hospitalization. Supportive psychotherapy and extensive psychoeducation was provided to the patient. The patient also participated in regular group therapy while hospitalized. Coping skills, problem solving as well as relaxation therapies were also part of the unit programming.  Labs were reviewed with the patient, and abnormal results were discussed with the patient.  The patient is able to verbalize their individual safety plan to this provider.  # It is recommended to the patient to continue psychiatric medications as prescribed, after discharge from the hospital.    # It is recommended to the patient to follow up with your outpatient  psychiatric provider and PCP.  # It was discussed with the patient, the impact of alcohol, drugs, tobacco have been there overall psychiatric and medical wellbeing, and total abstinence from substance use was recommended the patient.ed.  # Prescriptions provided or sent directly to preferred pharmacy at discharge. Patient agreeable to plan. Given opportunity to ask questions. Appears to feel comfortable with discharge.    # In the event of worsening symptoms, the patient is instructed to call the crisis hotline, 911 and or go to the nearest ED for appropriate evaluation and treatment of symptoms. To follow-up with primary care provider for other medical issues, concerns and or health care needs  # Patient was discharged home with a plan to follow up as noted below.    On day of discharge   He reports that he is doing well today.  He reports that the Luvox has been helpful and he does feel like the tinnitus is better than on admission.  He reports no side effects to his medications.  He reports his sleep is good.  He reports his appetite is doing good.  He reports no SI, HI, or AVH.  Discussed with him the importance of taking his medications as prescribed and attending his follow up appointments and he reported understanding.  Discussed with him what to do in the event of a future crisis.  Discussed that he can go to [facility], go to the nearest ED, or call 911 or 988.   He reported understanding and had no concerns.   Physical Findings: AIMS:  , ,  ,  ,  ,  ,  CIWA:  CIWA-Ar Total: 4 COWS:  COWS Total Score: 0  Musculoskeletal: Strength & Muscle Tone: within normal limits Gait & Station: normal Patient leans: N/A   Psychiatric Specialty Exam: Psychiatric Specialty Exam Mental Status Exam: General Appearance and Behavior: Tall, thin, pale caucasian young adult male.  Patient is clean, showered, but unshaven.  Orientation:  Full (Time, Place, and Person)  Memory:  Grossly intact   Attention: Good  Eye Contact:  Good  Speech:  Clear and Coherent and calm  Language:  Good  Volume:  Normal  Mood: I feel a lot better.  Affect:  Appropriate and Full Range  Thought Process: Linear, coherent  Thought Content: Minimal obsessions.  Suicidal Thoughts:  No  Homicidal Thoughts:  No  Judgement:  Fair  Insight: Good  Psychomotor Activity:  Normal  Akathisia:  No  Fund of Knowledge:  Good Assets:  Communication Skills Desire for Improvement Financial Resources/Insurance Housing Intimacy Leisure Time Physical Health Resilience Social Support  Cognition:  WNL  ADL's:  Intact      Physical Exam: Physical Exam Vitals and nursing note reviewed.  Constitutional:      General: He is not in acute distress.    Appearance: Normal appearance. He is not ill-appearing.  HENT:     Head: Normocephalic and atraumatic.  Pulmonary:     Effort: Pulmonary effort is normal.  Skin:    General: Skin is warm and dry.  Neurological:     General: No focal deficit present.     Mental Status: He is alert and oriented to person, place, and time.     Sensory: No sensory deficit.  Psychiatric:        Mood and Affect: Mood normal.        Behavior: Behavior normal.        Thought Content: Thought content normal.        Judgment: Judgment normal.    Review of Systems  Constitutional:  Negative for chills, diaphoresis, fever, malaise/fatigue and weight loss.  Respiratory:  Negative for cough.   Cardiovascular:  Negative for chest pain.  Gastrointestinal:  Positive for constipation. Negative for abdominal pain, diarrhea, nausea and vomiting.  Genitourinary:  Negative for urgency.   Blood pressure 105/66, pulse (!) 49, temperature 97.7 F (36.5 C), resp. rate 16, height 6' 2 (1.88 m), weight 73.5 kg, SpO2 100%. Body mass index is 20.8 kg/m.    Social History   Tobacco Use  Smoking Status Every Day   Current packs/day: 1.00   Types: Cigarettes  Smokeless Tobacco Former    Quit date: 08/2024  Tobacco Comments   Vapes tobacco every day      uses THC since 23 yrs old, occasionally twice week   Tobacco Cessation:  A prescription for an FDA-approved tobacco cessation medication provided at discharge   Blood Alcohol level:  Lab Results  Component Value Date   [facility] <15 08/27/2024    Metabolic Disorder Labs:  Lab Results  Component Value Date   HGBA1C 4.7 (L) 08/14/2024   MPG 88.19 08/14/2024   No results found for: PROLACTIN Lab Results  Component Value Date   CHOL 136 08/14/2024   TRIG 115 08/14/2024   HDL 57 08/14/2024   CHOLHDL 2.4 08/14/2024   VLDL 23 08/14/2024   LDLCALC 56 08/14/2024   LDLCALC 59 12/29/2012    See Psychiatric Specialty Exam and Suicide Risk Assessment completed by Attending Physician prior to discharge.  Discharge destination:  Home  Is patient on multiple  antipsychotic therapies at discharge:  No   Has Patient had three or more failed trials of antipsychotic monotherapy by history:  No  Recommended Plan for Multiple Antipsychotic Therapies: NA   Allergies as of 09/03/2024   No Known Allergies      Medication List     PAUSE taking these medications      Indication  Spravato (84 MG Dose) 28 MG/DEVICE Sopk Wait to take this until your doctor or other care provider tells you to start again. Generic drug: Esketamine HCl (84 MG Dose) Place 3 sprays into both nostrils See admin instructions. Frequency unspecified  Indication: Depression       STOP taking these medications    Buprenorphine HCl-Naloxone HCl 8-2 MG Film   clonazePAM 0.5 MG tablet Commonly known as: KLONOPIN Replaced by: clonazepam 0.125 MG disintegrating tablet   EPINEPHrine 1 MG/ML Soln injection Commonly known as: ADRENALIN   gabapentin 300 MG capsule Commonly known as: NEURONTIN   OLANZapine 10 MG tablet Commonly known as: ZYPREXA   OLANZapine 5 MG tablet Commonly known as: ZYPREXA   ONDANSETRON  PO   PREDNISONE  PO    SKYRIZI PEN        TAKE these medications      Indication  clonazepam 0.125 MG disintegrating tablet Commonly known as: KLONOPIN Take 3 tablets (0.375 mg total) by mouth at bedtime. Replaces: clonazePAM 0.5 MG tablet  Indication: Feeling Anxious, Anxiety Disorder   cloNIDine 0.1 MG tablet Commonly known as: CATAPRES Take 1 tablet (0.1 mg total) by mouth daily before breakfast. Start taking on: September 04, 2024 What changed:  when to take this reasons to take this  Indication: High Blood Pressure, Chronic Muscle Twitches or Movements   fluvoxaMINE 50 MG tablet Commonly known as: LUVOX Take 1 tablet (50 mg total) by mouth 2 (two) times daily. What changed:  medication strength how much to take when to take this  Indication: Obsessive Compulsive Disorder   hydrOXYzine 25 MG tablet Commonly known as: ATARAX Take 1 tablet (25 mg total) by mouth every 6 (six) hours as needed for anxiety.  Indication: Feeling Anxious   INFLECTRA IV Inject 370 mg into the vein See admin instructions. Inject into venous catheter.  Indication: Crohn's Disease of Colon   MULTIVITAMIN MEN PO Take 1 Dose by mouth daily. Take 1 gummy by mouth daily.  Indication: Nutritional Support   NAC PO Take 1 tablet by mouth daily.  Indication: Nutritional Support   nicotine  14 mg/24hr patch Commonly known as: NICODERM CQ  - dosed in mg/24 hours Place 1 patch (14 mg total) onto the skin daily.  Indication: Nicotine  Addiction   saccharomyces boulardii 250 MG capsule Commonly known as: FLORASTOR Take 1 capsule (250 mg total) by mouth 2 (two) times daily.  Indication: Crohn's Disease of Colon   traZODone  100 MG tablet Commonly known as: DESYREL  Take 1 tablet (100 mg total) by mouth at bedtime as needed for sleep.  Indication: Trouble Sleeping   Vitamin D (Ergocalciferol) 1.25 MG (50000 UNIT) Caps capsule Commonly known as: DRISDOL Take 1 capsule (50,000 Units total) by mouth every 7 (seven)  days. Start taking on: September 08, 2024  Indication: Vitamin D Deficiency        Follow-up Information     [facility] Follow up on 09/05/2024.   Why: Please call to personally confirm/make an appointment with Dr. Olam Buckle. Contact information: [address] [place] [address] [phone]  [facility] Follow up on 09/04/2024.   Why: Please call on 09/04/24 at 9:00 am to schedule an appointment for medication management and therapy services, as we were unable to contact prior to your discharge. Contact information: [address] [place], [place] [address] Phone: [phone]        [facility] Follow up on 09/04/2024.   Why: For [employer] students, please call on 09/04/24 at 9:00 am to schedule a return to campus meeting, to obtain therapy services. Contact information: Division of [employer]or, [employer] Building at [address] [place], [place]  E:[phone]               Follow-up recommendations:  Activity: as tolerated   Diet: heart healthy   Other: -Follow-up with your outpatient psychiatric provider -instructions on appointment date, time, and address (location) are provided to you in discharge paperwork.   -Take your psychiatric medications as prescribed at discharge - instructions are provided to you in the discharge paperwork   -Follow-up with outpatient primary care doctor and other specialists -for management of chronic medical disease, including: crohn's disease, tic disorder   -Testing: Follow-up with outpatient provider for abnormal lab results: vitamin d deficiency,    -Recommend abstinence from alcohol, tobacco, and other illicit drug use at discharge.    -If your psychiatric symptoms recur, worsen, or if you have side effects to your psychiatric medications, call your outpatient psychiatric provider, 911, 988 or go to the nearest emergency department.   -If suicidal thoughts  recur, call your outpatient psychiatric provider, 911, 988 or go to the nearest emergency department.  Signed: Lynwood Morene Lavone Delsie, MD 09/03/2024, 7:46 PM

## 2024-09-03 NOTE — Group Note (Deleted)
 Date:  09/03/2024 Time:  9:59 AM  Group Topic/Focus:  Goals Group:   The focus of this group is to help patients establish daily goals to achieve during treatment and discuss how the patient can incorporate goal setting into their daily lives to aide in recovery. Orientation:   The focus of this group is to educate the patient on the purpose and policies of crisis stabilization and provide a format to answer questions about their admission.  The group details unit policies and expectations of patients while admitted.     Participation Level:  {BHH PARTICIPATION OZCZO:77735}  Participation Quality:  {BHH PARTICIPATION QUALITY:22265}  Affect:  {BHH AFFECT:22266}  Cognitive:  {BHH COGNITIVE:22267}  Insight: {BHH Insight2:20797}  Engagement in Group:  {BHH ENGAGEMENT IN HMNLE:77731}  Modes of Intervention:  {BHH MODES OF INTERVENTION:22269}  Additional Comments:  ***  Jontez Redfield M Japheth Diekman 09/03/2024, 9:59 AM

## 2024-09-03 NOTE — [facility] Suicide Risk Assessment (Signed)
 Suicide Risk Assessment  Discharge Assessment    [facility] Discharge Suicide Risk Assessment   Principal Problem: Obsessive compulsive disorder Discharge Diagnoses: Principal Problem:   Obsessive compulsive disorder Active Problems:   Major depressive disorder, recurrent episode, moderate degree (HCC)   Total Time spent with patient: 30 minutes  Musculoskeletal: Strength & Muscle Tone: within normal limits Gait & Station: normal Patient leans: N/A  Psychiatric Specialty Exam Mental Status Exam: General Appearance and Behavior: Tall, thin, pale caucasian young adult male.  Patient is clean, showered, but unshaven.  Orientation:  Full (Time, Place, and Person)  Memory:  Grossly intact  Attention: Good  Eye Contact:  Good  Speech:  Clear and Coherent and calm  Language:  Good  Volume:  Normal  Mood: I feel a lot better.  Affect:  Appropriate and Full Range  Thought Process: Linear, coherent  Thought Content: Minimal obsessions.  Suicidal Thoughts:  No  Homicidal Thoughts:  No  Judgement:  Fair  Insight: Good  Psychomotor Activity:  Normal  Akathisia:  No  Fund of Knowledge:  Good Assets:  Communication Skills Desire for Improvement Financial Resources/Insurance Housing Intimacy Leisure Time Physical Health Resilience Social Support  Cognition:  WNL  ADL's:  Intact    Details about paranoia, delusions, or hallucinations:    Sleep  Sleep:No data recorded Estimated Sleeping Duration (Last 24 Hours): 4.75-5.75 hours (Due to Daylight Saving Time, the durations displayed may not accurately represent documentation during the time change interval)  Physical Exam: Physical Exam ROS Blood pressure 105/66, pulse (!) 49, temperature 97.7 F (36.5 C), resp. rate 16, height 6' 2 (1.88 m), weight 73.5 kg, SpO2 100%. Body mass index is 20.8 kg/m.  Mental Status Per Nursing Assessment::   On Admission:  Self-harm thoughts  Suicide Risk Assessment:  Suicidal  ideation/thoughts:  []  Current  [x]  Recent  [x]  Denies  []  Remote  Intention to act or plan:       []  Current  []  Recent [x]  Denies  []  Remote  Preparatory behavior:    []  Recent  [x]  Denies []  Remote  Suicide attempts:               []  This admission []  Recent  [x]  Denies   []  Multiple   []  Remote      Risk Factors  Protective Factors  Acute  Acute distress state AcuteSuicideProtectiveFactors: No access to highly lethal means, Denies current SI or Intent, No recent suicide attempts, Future oriented, Sense of purpose, Actively engaged in community, No recent exposure to suicide, No severe pain, No legal problems, and Executive function intact  Chronic Major psychiatric disorder, Emotional lability, Pattern of impulsivity/recklessness, Single, Separated, or Divorced, Caucasian Race, and Age (15-24 or >60) No previous suicide attempt, No previous self-harm behaviors, No history of cluster B personality disorder/traits, Good coping or problem solving skills, No significant history of abuse/trauma, No history of violence, No family hx of suicide attempts, Stable housing, Positive social support, Lives with someone else, especially a relative, No barriers to healthcare access, Medication adherence, Engagement in therapy, and Achievable life goals/ambitions   Potential future factors: FutureSuicideFactors : None  Summary: While it is impossible to accurately predict with absolute certainty future events and human behaviors, an assessment of current suicidal indicators, risk factors, and protective factors suggests that this patient's:   Acute suicide risk pd:fpwpfjo in degree.   Chronic suicide risk pd:fpwpfjo in degree. Increases with substance/alcohol use and acute intoxication.    Follow-up Information  [facility] Follow up on 09/05/2024.   Why: Please call to personally confirm/make an appointment with Dr. Olam Buckle. Contact information: [address] [place] [address] [phone]         [facility] Follow up on 09/04/2024.   Why: Please call on 09/04/24 at 9:00 am to schedule an appointment for medication management and therapy services, as we were unable to contact prior to your discharge. Contact information: [address] [place], [place] [address] Phone: [phone]        [facility] Follow up on 09/04/2024.   Why: For [employer] students, please call on 09/04/24 at 9:00 am to schedule a return to campus meeting, to obtain therapy services. Contact information: [facility] [employer] [employer] [facility] at [address], [place], [place]  E:[phone]                Plan Of Care/Follow-up recommendations:  Activity: as tolerated  Diet: heart healthy  Other: -Follow-up with your outpatient psychiatric provider -instructions on appointment date, time, and address (location) are provided to you in discharge paperwork.  -Take your psychiatric medications as prescribed at discharge - instructions are provided to you in the discharge paperwork  -Follow-up with outpatient primary care doctor and other specialists -for management of chronic medical disease, including: crohn's disease, tic disorder  -Testing: Follow-up with outpatient provider for abnormal lab results: vitamin d deficiency,   -Recommend abstinence from alcohol, tobacco, and other illicit drug use at discharge.   -If your psychiatric symptoms recur, worsen, or if you have side effects to your psychiatric medications, call your outpatient psychiatric provider, 911, [phone] or go to the nearest emergency department.  -If suicidal thoughts recur, call your outpatient psychiatric provider, 911, [phone] or go to the nearest emergency department.   Lynwood Morene Lavone Delsie, MD 09/03/2024, 8:11 AM

## 2024-09-03 NOTE — Progress Notes (Signed)
  [facility] Adult Case Management Discharge Plan :  Will you be returning to the same living situation after discharge:  Yes,  pt returning home at discharge At discharge, do you have transportation home?: Yes,  CSW arranged [employer] taxi for 10AM Do you have the ability to pay for your medications: Yes,  pt has active health insurance coverage  Release of information consent forms completed and in the chart;  Patient's signature needed at discharge.  Patient to Follow up at:  Follow-up Information     [facility] Follow up on 09/05/2024.   Why: Please call to personally confirm/make an appointment with Dr. Olam Buckle. Contact information: [address] [place] [address] [phone]         [facility] Follow up on 09/04/2024.   Why: Please call on 09/04/24 at 9:00 am to schedule an appointment for medication management and therapy services, as we were unable to contact prior to your discharge. Contact information: [address] [place], [place] [address] Phone: [phone]        [facility] Follow up on 09/04/2024.   Why: For [employer] students, please call on 09/04/24 at 9:00 am to schedule a return to campus meeting, to obtain therapy services. Contact information: [facility] [employer] [employer] [facility] at [address], [place], [place]  E:[phone]                Next level of care provider has access to [facility] [employer] and Suicide Prevention discussed: Yes,   Mother, [name] [phone]     Has patient been referred to the Quitline?: Patient refused referral for treatment  Patient has been referred for addiction treatment: No known substance use disorder.  Jenkins LULLA Primer, LCSWA 09/03/2024, 8:53 AM

## 2024-11-19 ENCOUNTER — Encounter: Payer: Self-pay | Admitting: *Deleted
